# Patient Record
Sex: Female | Born: 1945 | Race: White | Hispanic: No | Smoking: Never smoker
Health system: Southern US, Community
[De-identification: ages and names within clinical notes are randomized; demographics above are authoritative.]

## PROBLEM LIST (undated history)

## (undated) DIAGNOSIS — I10 Essential (primary) hypertension: Secondary | ICD-10-CM

## (undated) DIAGNOSIS — I4891 Unspecified atrial fibrillation: Secondary | ICD-10-CM

## (undated) DIAGNOSIS — E039 Hypothyroidism, unspecified: Secondary | ICD-10-CM

## (undated) DIAGNOSIS — E669 Obesity, unspecified: Secondary | ICD-10-CM

## (undated) DIAGNOSIS — J302 Other seasonal allergic rhinitis: Secondary | ICD-10-CM

## (undated) DIAGNOSIS — R32 Unspecified urinary incontinence: Secondary | ICD-10-CM

## (undated) DIAGNOSIS — K8020 Calculus of gallbladder without cholecystitis without obstruction: Secondary | ICD-10-CM

## (undated) DIAGNOSIS — B029 Zoster without complications: Secondary | ICD-10-CM

## (undated) DIAGNOSIS — E079 Disorder of thyroid, unspecified: Secondary | ICD-10-CM

## (undated) DIAGNOSIS — D649 Anemia, unspecified: Secondary | ICD-10-CM

## (undated) DIAGNOSIS — Z9289 Personal history of other medical treatment: Secondary | ICD-10-CM

## (undated) DIAGNOSIS — N189 Chronic kidney disease, unspecified: Secondary | ICD-10-CM

## (undated) DIAGNOSIS — E785 Hyperlipidemia, unspecified: Secondary | ICD-10-CM

## (undated) DIAGNOSIS — K219 Gastro-esophageal reflux disease without esophagitis: Secondary | ICD-10-CM

## (undated) DIAGNOSIS — K5790 Diverticulosis of intestine, part unspecified, without perforation or abscess without bleeding: Secondary | ICD-10-CM

## (undated) HISTORY — PX: TUBAL LIGATION: SHX77

## (undated) HISTORY — DX: Zoster without complications: B02.9

## (undated) HISTORY — DX: Chronic kidney disease, unspecified: N18.9

## (undated) HISTORY — DX: Diverticulosis of intestine, part unspecified, without perforation or abscess without bleeding: K57.90

## (undated) HISTORY — DX: Personal history of other medical treatment: Z92.89

## (undated) HISTORY — DX: Unspecified urinary incontinence: R32

## (undated) HISTORY — DX: Obesity, unspecified: E66.9

## (undated) HISTORY — DX: Hyperlipidemia, unspecified: E78.5

## (undated) HISTORY — DX: Other seasonal allergic rhinitis: J30.2

## (undated) HISTORY — PX: COLONOSCOPY: SHX174

## (undated) HISTORY — DX: Disorder of thyroid, unspecified: E07.9

## (undated) HISTORY — DX: Essential (primary) hypertension: I10

## (undated) HISTORY — DX: Anemia, unspecified: D64.9

---

## 1997-04-24 ENCOUNTER — Ambulatory Visit (HOSPITAL_COMMUNITY): Admission: RE | Admit: 1997-04-24 | Discharge: 1997-04-24 | Payer: Self-pay | Admitting: Gynecology

## 1998-04-23 ENCOUNTER — Encounter: Payer: Self-pay | Admitting: Internal Medicine

## 1998-04-23 ENCOUNTER — Ambulatory Visit (HOSPITAL_COMMUNITY): Admission: RE | Admit: 1998-04-23 | Discharge: 1998-04-23 | Payer: Self-pay | Admitting: Internal Medicine

## 1998-05-31 ENCOUNTER — Encounter: Admission: RE | Admit: 1998-05-31 | Discharge: 1998-08-29 | Payer: Self-pay | Admitting: Internal Medicine

## 1998-09-06 ENCOUNTER — Encounter: Admission: RE | Admit: 1998-09-06 | Discharge: 1998-12-05 | Payer: Self-pay | Admitting: Internal Medicine

## 1998-12-06 ENCOUNTER — Encounter: Admission: RE | Admit: 1998-12-06 | Discharge: 1999-03-06 | Payer: Self-pay | Admitting: Internal Medicine

## 1999-05-16 ENCOUNTER — Encounter: Admission: RE | Admit: 1999-05-16 | Discharge: 1999-08-14 | Payer: Self-pay | Admitting: Internal Medicine

## 1999-05-22 ENCOUNTER — Other Ambulatory Visit: Admission: RE | Admit: 1999-05-22 | Discharge: 1999-05-22 | Payer: Self-pay | Admitting: Internal Medicine

## 1999-05-27 ENCOUNTER — Ambulatory Visit (HOSPITAL_COMMUNITY): Admission: RE | Admit: 1999-05-27 | Discharge: 1999-05-27 | Payer: Self-pay | Admitting: Internal Medicine

## 1999-05-27 ENCOUNTER — Encounter: Payer: Self-pay | Admitting: Internal Medicine

## 1999-08-15 ENCOUNTER — Encounter: Admission: RE | Admit: 1999-08-15 | Discharge: 1999-11-13 | Payer: Self-pay | Admitting: Internal Medicine

## 1999-12-16 ENCOUNTER — Encounter: Admission: RE | Admit: 1999-12-16 | Discharge: 2000-03-15 | Payer: Self-pay | Admitting: Internal Medicine

## 2000-04-08 ENCOUNTER — Encounter: Admission: RE | Admit: 2000-04-08 | Discharge: 2000-07-07 | Payer: Self-pay | Admitting: Internal Medicine

## 2000-06-07 ENCOUNTER — Other Ambulatory Visit: Admission: RE | Admit: 2000-06-07 | Discharge: 2000-06-07 | Payer: Self-pay | Admitting: Internal Medicine

## 2000-09-21 ENCOUNTER — Ambulatory Visit (HOSPITAL_COMMUNITY): Admission: RE | Admit: 2000-09-21 | Discharge: 2000-09-21 | Payer: Self-pay | Admitting: Internal Medicine

## 2000-09-21 ENCOUNTER — Encounter: Payer: Self-pay | Admitting: Internal Medicine

## 2001-06-16 ENCOUNTER — Other Ambulatory Visit: Admission: RE | Admit: 2001-06-16 | Discharge: 2001-06-16 | Payer: Self-pay | Admitting: Internal Medicine

## 2001-09-27 ENCOUNTER — Ambulatory Visit (HOSPITAL_COMMUNITY): Admission: RE | Admit: 2001-09-27 | Discharge: 2001-09-27 | Payer: Self-pay | Admitting: Unknown Physician Specialty

## 2001-09-27 ENCOUNTER — Encounter: Payer: Self-pay | Admitting: Internal Medicine

## 2002-03-02 DIAGNOSIS — K5790 Diverticulosis of intestine, part unspecified, without perforation or abscess without bleeding: Secondary | ICD-10-CM | POA: Insufficient documentation

## 2002-03-02 HISTORY — DX: Diverticulosis of intestine, part unspecified, without perforation or abscess without bleeding: K57.90

## 2002-06-22 ENCOUNTER — Other Ambulatory Visit: Admission: RE | Admit: 2002-06-22 | Discharge: 2002-06-22 | Payer: Self-pay | Admitting: Internal Medicine

## 2002-10-03 ENCOUNTER — Encounter: Payer: Self-pay | Admitting: Internal Medicine

## 2002-10-03 ENCOUNTER — Ambulatory Visit (HOSPITAL_COMMUNITY): Admission: RE | Admit: 2002-10-03 | Discharge: 2002-10-03 | Payer: Self-pay | Admitting: Internal Medicine

## 2002-11-02 ENCOUNTER — Ambulatory Visit (HOSPITAL_COMMUNITY): Admission: RE | Admit: 2002-11-02 | Discharge: 2002-11-02 | Payer: Self-pay | Admitting: Gastroenterology

## 2003-03-03 DIAGNOSIS — B029 Zoster without complications: Secondary | ICD-10-CM

## 2003-03-03 HISTORY — DX: Zoster without complications: B02.9

## 2003-10-09 ENCOUNTER — Ambulatory Visit (HOSPITAL_COMMUNITY): Admission: RE | Admit: 2003-10-09 | Discharge: 2003-10-09 | Payer: Self-pay | Admitting: Internal Medicine

## 2004-09-26 ENCOUNTER — Other Ambulatory Visit: Admission: RE | Admit: 2004-09-26 | Discharge: 2004-09-26 | Payer: Self-pay | Admitting: Internal Medicine

## 2004-12-09 ENCOUNTER — Ambulatory Visit (HOSPITAL_COMMUNITY): Admission: RE | Admit: 2004-12-09 | Discharge: 2004-12-09 | Payer: Self-pay | Admitting: Internal Medicine

## 2005-12-17 ENCOUNTER — Ambulatory Visit (HOSPITAL_COMMUNITY): Admission: RE | Admit: 2005-12-17 | Discharge: 2005-12-17 | Payer: Self-pay | Admitting: Internal Medicine

## 2006-10-20 ENCOUNTER — Other Ambulatory Visit: Admission: RE | Admit: 2006-10-20 | Discharge: 2006-10-20 | Payer: Self-pay | Admitting: Internal Medicine

## 2006-12-23 ENCOUNTER — Ambulatory Visit (HOSPITAL_COMMUNITY): Admission: RE | Admit: 2006-12-23 | Discharge: 2006-12-23 | Payer: Self-pay | Admitting: Internal Medicine

## 2007-12-29 ENCOUNTER — Ambulatory Visit (HOSPITAL_COMMUNITY): Admission: RE | Admit: 2007-12-29 | Discharge: 2007-12-29 | Payer: Self-pay | Admitting: Internal Medicine

## 2008-10-23 ENCOUNTER — Other Ambulatory Visit: Admission: RE | Admit: 2008-10-23 | Discharge: 2008-10-23 | Payer: Self-pay | Admitting: Internal Medicine

## 2008-12-31 ENCOUNTER — Ambulatory Visit (HOSPITAL_COMMUNITY): Admission: RE | Admit: 2008-12-31 | Discharge: 2008-12-31 | Payer: Self-pay | Admitting: Internal Medicine

## 2009-11-13 ENCOUNTER — Other Ambulatory Visit: Admission: RE | Admit: 2009-11-13 | Discharge: 2009-11-13 | Payer: Self-pay | Admitting: Internal Medicine

## 2010-01-02 ENCOUNTER — Ambulatory Visit (HOSPITAL_COMMUNITY): Admission: RE | Admit: 2010-01-02 | Discharge: 2010-01-02 | Payer: Self-pay | Admitting: Psychiatry

## 2010-07-18 NOTE — Op Note (Signed)
   NAME:  Kayla Hale, Kayla Hale                            ACCOUNT NO.:  [id number]   MEDICAL RECORD NO.:  [id number]                   PATIENT TYPE:  AMB   LOCATION:  ENDO                                 FACILITY:  [facility]   PHYSICIAN:  Danise Edge, M.D.                DATE OF BIRTH:  1945/08/10   DATE OF PROCEDURE:  11/02/2002  DATE OF DISCHARGE:                                 OPERATIVE REPORT   PROCEDURE PERFORMED:  Colonoscopy.   ENDOSCOPIST:  Charolett Bumpers, M.D.   INDICATIONS FOR PROCEDURE:  Ms. Kayla Hale is a 65 year old female born  12/14/1945.  Kayla Hale underwent her health maintenance  proctosigmoidoscopy and a polyp was discovered at 35 cm from the anal verge  per Dr. Jerelyn Scott Husain's report.   PREMEDICATION:  Versed 10 mg, Demerol 50 mg.   DESCRIPTION OF PROCEDURE:  After obtaining informed consent, Kayla Hale was  placed in the left lateral decubitus position.  I administered intravenous  Demerol and intravenous Versed to achieve conscious sedation for the  procedure.  The patient's blood pressure, oxygen saturations and cardiac  rhythm were monitored throughout the procedure and documented in the medical  record.   Anal inspection was normal.  Digital rectal exam was normal.  The pediatric  Olympus video colonoscope was introduced into the rectum and advanced to the  cecum.  Colonic preparation for the exam today was excellent.   Rectum:  Normal.   Sigmoid colon and descending colon:  Left colonic diverticulosis without  colorectal neoplasia.   Splenic flexure:  Normal.   Transverse colon:  Normal.   Hepatic flexure:  Normal.   Ascending colon:  Normal.   Cecum and ileocecal valve:  Normal.    ASSESSMENT:  Left colonic diverticulosis; otherwise, normal  proctocolonoscopy to the cecum.  No endoscopic evidence for the presence of  colorectal neoplasia.                                                 Danise Edge, M.D.    MJ/MEDQ  D:   11/02/2002  T:  11/03/2002  Job:  [id number]   cc:   Georgann Housekeeper, M.D.  [address]  [place]  [place] [address]  Fax: [phone]

## 2010-11-24 ENCOUNTER — Other Ambulatory Visit (HOSPITAL_COMMUNITY): Payer: Self-pay | Admitting: Internal Medicine

## 2010-11-24 DIAGNOSIS — Z1231 Encounter for screening mammogram for malignant neoplasm of breast: Secondary | ICD-10-CM

## 2011-01-06 ENCOUNTER — Ambulatory Visit (HOSPITAL_COMMUNITY): Payer: Self-pay

## 2011-02-03 ENCOUNTER — Ambulatory Visit (HOSPITAL_COMMUNITY)
Admission: RE | Admit: 2011-02-03 | Discharge: 2011-02-03 | Disposition: A | Discharge status: Home / Self Care | Payer: Managed Care, Other (non HMO) | Source: Ambulatory Visit | Attending: Internal Medicine | Admitting: Internal Medicine

## 2011-02-03 DIAGNOSIS — Z1231 Encounter for screening mammogram for malignant neoplasm of breast: Secondary | ICD-10-CM | POA: Insufficient documentation

## 2012-01-01 ENCOUNTER — Other Ambulatory Visit (HOSPITAL_COMMUNITY): Payer: Self-pay | Admitting: Internal Medicine

## 2012-01-01 DIAGNOSIS — Z1231 Encounter for screening mammogram for malignant neoplasm of breast: Secondary | ICD-10-CM

## 2012-02-05 ENCOUNTER — Ambulatory Visit (HOSPITAL_COMMUNITY)
Admission: RE | Admit: 2012-02-05 | Discharge: 2012-02-05 | Disposition: A | Discharge status: Home / Self Care | Payer: Medicare Other | Source: Ambulatory Visit | Attending: Internal Medicine | Admitting: Internal Medicine

## 2012-02-05 DIAGNOSIS — Z1231 Encounter for screening mammogram for malignant neoplasm of breast: Secondary | ICD-10-CM

## 2013-01-09 ENCOUNTER — Other Ambulatory Visit (HOSPITAL_COMMUNITY): Payer: Self-pay | Admitting: Internal Medicine

## 2013-01-09 DIAGNOSIS — Z1231 Encounter for screening mammogram for malignant neoplasm of breast: Secondary | ICD-10-CM

## 2013-02-13 ENCOUNTER — Ambulatory Visit (HOSPITAL_COMMUNITY)
Admission: RE | Admit: 2013-02-13 | Discharge: 2013-02-13 | Disposition: A | Discharge status: Home / Self Care | Payer: Medicare Other | Source: Ambulatory Visit | Attending: Internal Medicine | Admitting: Internal Medicine

## 2013-02-13 DIAGNOSIS — Z1231 Encounter for screening mammogram for malignant neoplasm of breast: Secondary | ICD-10-CM | POA: Insufficient documentation

## 2013-12-05 ENCOUNTER — Encounter: Payer: Self-pay | Admitting: Cardiovascular Disease

## 2013-12-05 ENCOUNTER — Telehealth: Payer: Self-pay | Admitting: Cardiovascular Disease

## 2013-12-05 ENCOUNTER — Ambulatory Visit (INDEPENDENT_AMBULATORY_CARE_PROVIDER_SITE_OTHER): Payer: Medicare Other | Admitting: Cardiovascular Disease

## 2013-12-05 VITALS — BP 122/80 | HR 65 | Ht 66.0 in | Wt 185.4 lb

## 2013-12-05 DIAGNOSIS — I482 Chronic atrial fibrillation, unspecified: Secondary | ICD-10-CM | POA: Insufficient documentation

## 2013-12-05 LAB — CBC WITH DIFFERENTIAL/PLATELET
Basophils Absolute: 0 10*3/uL (ref 0.0–0.1)
Basophils Relative: 0.5 % (ref 0.0–3.0)
Eosinophils Absolute: 0.2 10*3/uL (ref 0.0–0.7)
Eosinophils Relative: 3.8 % (ref 0.0–5.0)
HCT: 38.3 % (ref 36.0–46.0)
Hemoglobin: 12.8 g/dL (ref 12.0–15.0)
Lymphocytes Relative: 31.1 % (ref 12.0–46.0)
Lymphs Abs: 1.8 10*3/uL (ref 0.7–4.0)
MCHC: 33.5 g/dL (ref 30.0–36.0)
MCV: 94.6 fl (ref 78.0–100.0)
Monocytes Absolute: 0.4 10*3/uL (ref 0.1–1.0)
Monocytes Relative: 7.5 % (ref 3.0–12.0)
Neutro Abs: 3.4 10*3/uL (ref 1.4–7.7)
Neutrophils Relative %: 57.1 % (ref 43.0–77.0)
Platelets: 183 10*3/uL (ref 150.0–400.0)
RBC: 4.05 Mil/uL (ref 3.87–5.11)
RDW: 13.1 % (ref 11.5–15.5)
WBC: 5.9 10*3/uL (ref 4.0–10.5)

## 2013-12-05 NOTE — Assessment & Plan Note (Addendum)
Kayla Hale presents for evaluation of atrial fibrillation. She has chronic atrial fibrillation. Her EKG from 2001 shows atrial fibrillation with a well-controlled ventricular response.  She is completely asymptomatic. She does have a CHADS VASC score of 3 ( female, age 68, HTN)  Will start Eliquis 5 BID Will check labs including a TSH, basic metabolic profile, liver enzymes, CBC, Will  Order  an echocardiogram. I'll see her again in 3 months for a followup visit.

## 2013-12-05 NOTE — Patient Instructions (Addendum)
Check the price of the following anticoagulants:  Pradaxa 150 mg twice a day Xarelto 20 mg a day Eliquis 5 mg twice a day Savaysa 60 mg a day.  Your physician has requested that you have an echocardiogram. Echocardiography is a painless test that uses sound waves to create images of your heart. It provides your doctor with information about the size and shape of your heart and how well your heart's chambers and valves are working. This procedure takes approximately one hour. There are no restrictions for this procedure.  Your physician recommends that you have lab work:  TODAY - TSH, BMET, Liver, CBC  Your physician recommends that you schedule a follow-up appointment in: 3 months with Dr. Acie Fredrickson.

## 2013-12-05 NOTE — Progress Notes (Signed)
     Hyacinth Meeker Date of Birth  1945/09/02       [facility] Office [address], [place], [place]  [address]   [place], [place]  [address] [address]   Fax  [phone]     Fax [phone]  Problem List: 1. Atrial fibrillation - CHADS VASC score = 3 2. Hypertension  History of Present Illness:  Nirali is a 68 yo who is referred for atrial fib.  Hx of HTN.  She is basically a symptomatically she was found to have an irregular heart rate on  A visit to Urgent care ( which was for a bladder infection) . EKG revealed atrial fibrillation. She does have occasional episodes of palpitations but he seemed to be more related to stress.  Nonsmoker Rare ETOH -  Fhx - father died of MI. ( age 43s), sister has CAd,,  No current outpatient prescriptions on file prior to visit.   No current facility-administered medications on file prior to visit.    Allergies  Allergen Reactions  . Lisinopril     COUGH  . Norvasc [Amlodipine Besylate]     LEG EDEMA     Past Medical History  Diagnosis Date  . Hypertension   . Thyroid disease   . Hyperlipidemia   . Dyslipidemia   . Mild obesity   . Urinary incontinence   . Seasonal allergies   . Shingles 2005    LOWER BACK  . Diverticulosis 2004  . Mild anemia   . Chronic kidney disease     STAGE 2    Past Surgical History  Procedure Laterality Date  . Colonoscopy      History  Smoking status  . Never Smoker   Smokeless tobacco  . Not on file    History  Alcohol Use No    Family History  Problem Relation Age of Onset  . Heart attack Father   . Hypertension Father   . Heart attack Sister   . Diabetes Sister   . CVA Brother   . Diabetes Brother     Reviw of Systems:  Reviewed in the HPI.  All other systems are negative.  Physical Exam: Blood pressure 122/80, pulse 65, height 5\' 6"  (1.676 m), weight 185 lb 6.4 oz (84.097 kg). Wt  Readings from Last 3 Encounters:  12/05/13 185 lb 6.4 oz (84.097 kg)     General: Well developed, well nourished, in no acute distress.  Head: Normocephalic, atraumatic, sclera non-icteric, mucus membranes are moist,   Neck: Supple. Carotids are 2 + without bruits. No JVD   Lungs: Clear   Heart: Irreg. Irreg.  Normal s1s2  Abdomen: Soft, non-tender, non-distended with normal bowel sounds.  Msk:  Strength and tone are normal   Extremities: No clubbing or cyanosis. No edema.  Distal pedal pulses are 2+ and equal    Neuro: CN II - XII intact.  Alert and oriented X 3.   Psych:  Normal   ECG: Oct. 6, 2015: Atrial fibrillation with a ventricular rate of 65. She has no ST or T wave changes.  Assessment / Plan:

## 2013-12-05 NOTE — Telephone Encounter (Signed)
New message    Patient was seen today .   Need prior authorization eliquis 5 mg twice a day.   Fax  # [phone]- [facility].

## 2013-12-06 ENCOUNTER — Ambulatory Visit (HOSPITAL_COMMUNITY): Payer: Medicare Other | Attending: Internal Medicine | Admitting: Radiology

## 2013-12-06 DIAGNOSIS — Z8249 Family history of ischemic heart disease and other diseases of the circulatory system: Secondary | ICD-10-CM | POA: Insufficient documentation

## 2013-12-06 DIAGNOSIS — I482 Chronic atrial fibrillation, unspecified: Secondary | ICD-10-CM

## 2013-12-06 DIAGNOSIS — I10 Essential (primary) hypertension: Secondary | ICD-10-CM | POA: Diagnosis not present

## 2013-12-06 DIAGNOSIS — E785 Hyperlipidemia, unspecified: Secondary | ICD-10-CM | POA: Insufficient documentation

## 2013-12-06 LAB — BASIC METABOLIC PANEL
BUN: 23 mg/dL (ref 6–23)
CO2: 24 mEq/L (ref 19–32)
Calcium: 9.5 mg/dL (ref 8.4–10.5)
Chloride: 104 mEq/L (ref 96–112)
Creatinine, Ser: 0.9 mg/dL (ref 0.4–1.2)
GFR: 67.95 mL/min (ref >60.00)
Glucose, Bld: 97 mg/dL (ref 70–99)
Potassium: 4.5 mEq/L (ref 3.5–5.1)
Sodium: 139 mEq/L (ref 135–145)

## 2013-12-06 LAB — HEPATIC FUNCTION PANEL
ALT: 22 U/L (ref 0–35)
AST: 27 U/L (ref 0–37)
Albumin: 4.2 g/dL (ref 3.5–5.2)
Alkaline Phosphatase: 55 U/L (ref 39–117)
Bilirubin, Direct: 0.1 mg/dL (ref 0.0–0.3)
Total Bilirubin: 0.6 mg/dL (ref 0.2–1.2)
Total Protein: 7.6 g/dL (ref 6.0–8.3)

## 2013-12-06 LAB — TSH: TSH: 1.41 u[IU]/mL (ref 0.35–4.50)

## 2013-12-06 NOTE — Progress Notes (Signed)
Echocardiogram performed.  

## 2013-12-08 ENCOUNTER — Encounter: Payer: Self-pay | Admitting: Internal Medicine

## 2013-12-21 MED ORDER — APIXABAN 5 MG PO TABS: 5.0000 mg | ORAL_TABLET | Freq: Two times a day (BID) | ORAL | Status: DC

## 2013-12-21 NOTE — Telephone Encounter (Signed)
New message  ° ° °Patient calling for test results.   °

## 2013-12-21 NOTE — Telephone Encounter (Signed)
Reviewed Echo results with patient per Dr. Acie Fredrickson:   Normal LV function Mild valvular disease that if of no significant concern ( AI and MR)  Also reviewed lab results.    Patient requesting Rx for Eliquis 5 mg BID to [facility].  Patient was given samples at last ov and advised to call insurance company to price agents and to call back and let me know which medication she would like to take.   Sample placed at front desk for patient and PA initiated with [facility] Rx Approval received [phone]  Patient needs 1 month f/u CBC and BMET for NOAC therapy (11/6) - lab appointment made for 11/2 to accommodate patient's work schedule - patient will get lab work and Dr. Acie Fredrickson will follow

## 2014-01-01 ENCOUNTER — Other Ambulatory Visit (INDEPENDENT_AMBULATORY_CARE_PROVIDER_SITE_OTHER): Payer: Medicare Other | Admitting: *Deleted

## 2014-01-01 DIAGNOSIS — I482 Chronic atrial fibrillation, unspecified: Secondary | ICD-10-CM

## 2014-01-01 LAB — CBC WITH DIFFERENTIAL/PLATELET
Basophils Absolute: 0 10*3/uL (ref 0.0–0.1)
Basophils Relative: 0.5 % (ref 0.0–3.0)
Eosinophils Absolute: 0.3 10*3/uL (ref 0.0–0.7)
Eosinophils Relative: 6.3 % — ABNORMAL HIGH (ref 0.0–5.0)
HCT: 37.5 % (ref 36.0–46.0)
Hemoglobin: 12.4 g/dL (ref 12.0–15.0)
Lymphocytes Relative: 34.3 % (ref 12.0–46.0)
Lymphs Abs: 1.7 10*3/uL (ref 0.7–4.0)
MCHC: 33 g/dL (ref 30.0–36.0)
MCV: 94.6 fl (ref 78.0–100.0)
Monocytes Absolute: 0.4 10*3/uL (ref 0.1–1.0)
Monocytes Relative: 8.7 % (ref 3.0–12.0)
Neutro Abs: 2.5 10*3/uL (ref 1.4–7.7)
Neutrophils Relative %: 50.2 % (ref 43.0–77.0)
Platelets: 185 10*3/uL (ref 150.0–400.0)
RBC: 3.96 Mil/uL (ref 3.87–5.11)
RDW: 13.1 % (ref 11.5–15.5)
WBC: 4.9 10*3/uL (ref 4.0–10.5)

## 2014-01-01 LAB — BASIC METABOLIC PANEL
BUN: 17 mg/dL (ref 6–23)
CO2: 30 mEq/L (ref 19–32)
Calcium: 9 mg/dL (ref 8.4–10.5)
Chloride: 103 mEq/L (ref 96–112)
Creatinine, Ser: 0.8 mg/dL (ref 0.4–1.2)
GFR: 74.75 mL/min (ref >60.00)
Glucose, Bld: 97 mg/dL (ref 70–99)
Potassium: 4.5 mEq/L (ref 3.5–5.1)
Sodium: 137 mEq/L (ref 135–145)

## 2014-01-12 ENCOUNTER — Other Ambulatory Visit (HOSPITAL_COMMUNITY): Payer: Self-pay | Admitting: Internal Medicine

## 2014-01-12 DIAGNOSIS — Z1231 Encounter for screening mammogram for malignant neoplasm of breast: Secondary | ICD-10-CM

## 2014-03-08 ENCOUNTER — Ambulatory Visit: Payer: Medicare Other | Admitting: Cardiovascular Disease

## 2014-03-13 ENCOUNTER — Emergency Department (HOSPITAL_COMMUNITY): Payer: Medicare Other

## 2014-03-13 ENCOUNTER — Encounter (HOSPITAL_COMMUNITY): Payer: Self-pay | Admitting: *Deleted

## 2014-03-13 ENCOUNTER — Inpatient Hospital Stay (HOSPITAL_COMMUNITY)
Admission: EM | Admit: 2014-03-13 | Discharge: 2014-03-19 | DRG: 417 | Disposition: A | Discharge status: Home / Self Care | Payer: Medicare Other | Attending: Internal Medicine | Admitting: Internal Medicine

## 2014-03-13 DIAGNOSIS — N182 Chronic kidney disease, stage 2 (mild): Secondary | ICD-10-CM | POA: Diagnosis present

## 2014-03-13 DIAGNOSIS — I482 Chronic atrial fibrillation, unspecified: Secondary | ICD-10-CM | POA: Diagnosis present

## 2014-03-13 DIAGNOSIS — K851 Biliary acute pancreatitis without necrosis or infection: Secondary | ICD-10-CM

## 2014-03-13 DIAGNOSIS — K859 Acute pancreatitis without necrosis or infection, unspecified: Secondary | ICD-10-CM

## 2014-03-13 DIAGNOSIS — Z7901 Long term (current) use of anticoagulants: Secondary | ICD-10-CM | POA: Diagnosis not present

## 2014-03-13 DIAGNOSIS — K219 Gastro-esophageal reflux disease without esophagitis: Secondary | ICD-10-CM | POA: Diagnosis present

## 2014-03-13 DIAGNOSIS — R079 Chest pain, unspecified: Secondary | ICD-10-CM

## 2014-03-13 DIAGNOSIS — R7989 Other specified abnormal findings of blood chemistry: Secondary | ICD-10-CM | POA: Diagnosis not present

## 2014-03-13 DIAGNOSIS — K8051 Calculus of bile duct without cholangitis or cholecystitis with obstruction: Secondary | ICD-10-CM | POA: Diagnosis not present

## 2014-03-13 DIAGNOSIS — R109 Unspecified abdominal pain: Secondary | ICD-10-CM

## 2014-03-13 DIAGNOSIS — E785 Hyperlipidemia, unspecified: Secondary | ICD-10-CM | POA: Diagnosis present

## 2014-03-13 DIAGNOSIS — K831 Obstruction of bile duct: Secondary | ICD-10-CM | POA: Diagnosis present

## 2014-03-13 DIAGNOSIS — I129 Hypertensive chronic kidney disease with stage 1 through stage 4 chronic kidney disease, or unspecified chronic kidney disease: Secondary | ICD-10-CM | POA: Diagnosis present

## 2014-03-13 DIAGNOSIS — E039 Hypothyroidism, unspecified: Secondary | ICD-10-CM | POA: Diagnosis present

## 2014-03-13 DIAGNOSIS — R945 Abnormal results of liver function studies: Secondary | ICD-10-CM

## 2014-03-13 DIAGNOSIS — K8020 Calculus of gallbladder without cholecystitis without obstruction: Secondary | ICD-10-CM | POA: Diagnosis not present

## 2014-03-13 DIAGNOSIS — I10 Essential (primary) hypertension: Secondary | ICD-10-CM | POA: Diagnosis present

## 2014-03-13 HISTORY — DX: Calculus of gallbladder without cholecystitis without obstruction: K80.20

## 2014-03-13 HISTORY — DX: Gastro-esophageal reflux disease without esophagitis: K21.9

## 2014-03-13 HISTORY — DX: Unspecified atrial fibrillation: I48.91

## 2014-03-13 HISTORY — DX: Hypothyroidism, unspecified: E03.9

## 2014-03-13 LAB — APTT: aPTT: >200 seconds (ref 24–37)

## 2014-03-13 LAB — CBC WITH DIFFERENTIAL/PLATELET
Basophils Absolute: 0 10*3/uL (ref 0.0–0.1)
Basophils Relative: 0 % (ref 0–1)
Eosinophils Absolute: 0 10*3/uL (ref 0.0–0.7)
Eosinophils Relative: 0 % (ref 0–5)
HCT: 42.8 % (ref 36.0–46.0)
Hemoglobin: 15.2 g/dL — ABNORMAL HIGH (ref 12.0–15.0)
Lymphocytes Relative: 6 % — ABNORMAL LOW (ref 12–46)
Lymphs Abs: 0.6 10*3/uL — ABNORMAL LOW (ref 0.7–4.0)
MCH: 32.3 pg (ref 26.0–34.0)
MCHC: 35.5 g/dL (ref 30.0–36.0)
MCV: 91.1 fL (ref 78.0–100.0)
Monocytes Absolute: 0.4 10*3/uL (ref 0.1–1.0)
Monocytes Relative: 5 % (ref 3–12)
Neutro Abs: 8.2 10*3/uL — ABNORMAL HIGH (ref 1.7–7.7)
Neutrophils Relative %: 89 % — ABNORMAL HIGH (ref 43–77)
Platelets: 189 10*3/uL (ref 150–400)
RBC: 4.7 MIL/uL (ref 3.87–5.11)
RDW: 12.9 % (ref 11.5–15.5)
WBC: 9.3 10*3/uL (ref 4.0–10.5)

## 2014-03-13 LAB — COMPREHENSIVE METABOLIC PANEL
ALT: 241 U/L — ABNORMAL HIGH (ref 0–35)
AST: 211 U/L — ABNORMAL HIGH (ref 0–37)
Albumin: 4.3 g/dL (ref 3.5–5.2)
Alkaline Phosphatase: 181 U/L — ABNORMAL HIGH (ref 39–117)
Anion gap: 12 (ref 5–15)
BUN: 17 mg/dL (ref 6–23)
CO2: 24 mmol/L (ref 19–32)
Calcium: 9.7 mg/dL (ref 8.4–10.5)
Chloride: 104 mEq/L (ref 96–112)
Creatinine, Ser: 0.88 mg/dL (ref 0.50–1.10)
GFR calc Af Amer: 76 mL/min — ABNORMAL LOW (ref >90)
GFR calc non Af Amer: 66 mL/min — ABNORMAL LOW (ref >90)
Glucose, Bld: 145 mg/dL — ABNORMAL HIGH (ref 70–99)
Potassium: 3.9 mmol/L (ref 3.5–5.1)
Sodium: 140 mmol/L (ref 135–145)
Total Bilirubin: 4.3 mg/dL — ABNORMAL HIGH (ref 0.3–1.2)
Total Protein: 7.6 g/dL (ref 6.0–8.3)

## 2014-03-13 LAB — PROTIME-INR
INR: 1.26 (ref 0.00–1.49)
Prothrombin Time: 15.9 seconds — ABNORMAL HIGH (ref 11.6–15.2)

## 2014-03-13 LAB — HEPARIN LEVEL (UNFRACTIONATED): Heparin Unfractionated: 0.9 IU/mL — ABNORMAL HIGH (ref 0.30–0.70)

## 2014-03-13 LAB — AMYLASE: Amylase: 2514 U/L — ABNORMAL HIGH (ref 0–105)

## 2014-03-13 LAB — I-STAT TROPONIN, ED: Troponin i, poc: 0 ng/mL (ref 0.00–0.08)

## 2014-03-13 LAB — LIPASE, BLOOD: Lipase: >3000 U/L — ABNORMAL HIGH (ref 11–59)

## 2014-03-13 MED ORDER — SODIUM CHLORIDE 0.9 % IV BOLUS (SEPSIS)
1000.0000 mL | Freq: Once | INTRAVENOUS | Status: AC
Start: 2014-03-13 — End: 2014-03-13
  Administered 2014-03-13: 1000 mL via INTRAVENOUS

## 2014-03-13 MED ORDER — HEPARIN (PORCINE) IN NACL 100-0.45 UNIT/ML-% IJ SOLN
1000.0000 [IU]/h | INTRAMUSCULAR | Status: DC
  Administered 2014-03-13 – 2014-03-15 (×3): 1000 [IU]/h via INTRAVENOUS
  Filled 2014-03-13 (×6): qty 250

## 2014-03-13 MED ORDER — ONDANSETRON HCL 4 MG/2ML IJ SOLN
4.0000 mg | Freq: Once | INTRAMUSCULAR | Status: AC
  Administered 2014-03-13: 4 mg via INTRAVENOUS
  Filled 2014-03-13: qty 2

## 2014-03-13 MED ORDER — ONDANSETRON HCL 4 MG PO TABS
4.0000 mg | ORAL_TABLET | Freq: Four times a day (QID) | ORAL | Status: DC | PRN
Start: 2014-03-13 — End: 2014-03-19

## 2014-03-13 MED ORDER — SODIUM CHLORIDE 0.9 % IV BOLUS (SEPSIS)
1000.0000 mL | Freq: Once | INTRAVENOUS | Status: AC
  Administered 2014-03-13: 1000 mL via INTRAVENOUS

## 2014-03-13 MED ORDER — OXYBUTYNIN CHLORIDE 5 MG PO TABS
5.0000 mg | ORAL_TABLET | Freq: Two times a day (BID) | ORAL | Status: DC
  Administered 2014-03-13 – 2014-03-19 (×11): 5 mg via ORAL
  Filled 2014-03-13 (×14): qty 1

## 2014-03-13 MED ORDER — SODIUM CHLORIDE 0.9 % IV SOLN
INTRAVENOUS | Status: DC
  Administered 2014-03-13 – 2014-03-14 (×3): via INTRAVENOUS

## 2014-03-13 MED ORDER — MORPHINE SULFATE 2 MG/ML IJ SOLN
1.0000 mg | INTRAMUSCULAR | Status: DC | PRN
  Administered 2014-03-13 – 2014-03-15 (×5): 1 mg via INTRAVENOUS
  Filled 2014-03-13 (×5): qty 1

## 2014-03-13 MED ORDER — LEVOTHYROXINE SODIUM 75 MCG PO TABS
75.0000 ug | ORAL_TABLET | Freq: Every day | ORAL | Status: DC
  Administered 2014-03-14 – 2014-03-19 (×5): 75 ug via ORAL
  Filled 2014-03-13 (×7): qty 1
  Filled 2014-03-13: qty 3

## 2014-03-13 MED ORDER — MELATONIN 200 MCG PO TABS: 200.0000 ug | ORAL_TABLET | Freq: Every evening | ORAL | Status: DC | PRN

## 2014-03-13 MED ORDER — HYDROMORPHONE HCL 1 MG/ML IJ SOLN
1.0000 mg | Freq: Once | INTRAMUSCULAR | Status: AC
  Administered 2014-03-13: 1 mg via INTRAVENOUS
  Filled 2014-03-13: qty 1

## 2014-03-13 MED ORDER — ONDANSETRON HCL 4 MG/2ML IJ SOLN
4.0000 mg | Freq: Four times a day (QID) | INTRAMUSCULAR | Status: DC | PRN
  Administered 2014-03-13: 4 mg via INTRAVENOUS
  Filled 2014-03-13: qty 2

## 2014-03-13 MED ORDER — MORPHINE SULFATE 4 MG/ML IJ SOLN
4.0000 mg | Freq: Once | INTRAMUSCULAR | Status: AC
  Administered 2014-03-13: 4 mg via INTRAVENOUS
  Filled 2014-03-13: qty 1

## 2014-03-13 MED ORDER — HEPARIN BOLUS VIA INFUSION
4000.0000 [IU] | Freq: Once | INTRAVENOUS | Status: AC
  Administered 2014-03-13: 4000 [IU] via INTRAVENOUS
  Filled 2014-03-13: qty 4000

## 2014-03-13 NOTE — ED Notes (Signed)
Patient with complaints of vomitting since Sunday.  The vomitting has increased and she is having more abd pain.  Patient was seen at her MD and advised to come to ED for further eval.  Patient last bm was Sunday.  Patient states her normal pattern is BM daily.  Patient has noted belching.  Pale in color.  Patient with no complaints of urinary pain but states her urine is dark.  Patient was given something for nausea via injection.  Patient states her abdomen is sore in the mid to lower abdomen.  Patient has had frequent emesis today

## 2014-03-13 NOTE — ED Provider Notes (Signed)
CSN: [id number]     Arrival date & time 03/13/14  1238 History   First MD Initiated Contact with Patient 03/13/14 1327     Chief Complaint  Patient presents with  . Abdominal Pain  . Emesis   Kayla Hale is a 69 y.o. female with a history of chronic atrial fibrillation who presents to the emergency department complaining of nausea, vomiting and abdominal pain since Sunday. The patient reports she ate a [employer] hamburger on Sunday and started having lots of vomiting and nausea. She reports feeling better yesterday however her abdominal pain and nausea returned today. The patient claims epigastric and right upper quadrant abdominal pain that she rates at an 8 out of 10. Patient reports vomiting possibly 30 times today. Patient denies diarrhea. Patient's last bowel movement was on Sunday and was normal. Patient reports chills without fever. The patient was seen by her primary care provider and given 50 mg of Phenergan at his office, with mild relief of her nausea. Patient denies previous abdominal surgeries. The patient denies fevers, hematemesis, dysuria, hematuria, hematochezia, sick contacts, diarrhea, chest pain or shortness of breath.  (Consider location/radiation/quality/duration/timing/severity/associated sxs/prior Treatment) HPI  Past Medical History  Diagnosis Date  . Hypertension   . Thyroid disease   . Hyperlipidemia   . Dyslipidemia   . Mild obesity   . Urinary incontinence   . Seasonal allergies   . Shingles 2005    LOWER BACK  . Diverticulosis 2004  . Mild anemia   . Chronic kidney disease     STAGE 2   Past Surgical History  Procedure Laterality Date  . Colonoscopy     Family History  Problem Relation Age of Onset  . Heart attack Father   . Hypertension Father   . Heart attack Sister   . Diabetes Sister   . CVA Brother   . Diabetes Brother    History  Substance Use Topics  . Smoking status: Never Smoker   . Smokeless tobacco: Not on file  . Alcohol Use: No    OB History    No data available     Review of Systems  Constitutional: Positive for chills. Negative for fever.  HENT: Negative for congestion, sore throat and trouble swallowing.   Eyes: Negative for visual disturbance.  Respiratory: Negative for cough, shortness of breath and wheezing.   Cardiovascular: Negative for chest pain and palpitations.  Gastrointestinal: Positive for nausea, vomiting and abdominal pain. Negative for diarrhea and blood in stool.  Genitourinary: Negative for dysuria, urgency, frequency, hematuria and flank pain.  Musculoskeletal: Negative for back pain and neck pain.  Skin: Negative for rash.  Neurological: Negative for syncope, weakness, light-headedness and headaches.  All other systems reviewed and are negative.     Allergies  Lisinopril and Norvasc  Home Medications   Prior to Admission medications   Medication Sig Start Date End Date Taking? Authorizing Provider  ALPRAZolam Duanne Moron) 0.5 MG tablet Take 0.5 mg by mouth See admin instructions. Takes 1 tablet once a week as needed   Yes Historical Provider, MD  apixaban (ELIQUIS) 5 MG TABS tablet Take 1 tablet (5 mg total) by mouth 2 (two) times daily. 12/21/13  Yes Thayer Headings, MD  bisoprolol-hydrochlorothiazide [facility]) 10-6.25 MG per tablet Take 1 tablet by mouth daily.   Yes Historical Provider, MD  Calcium Carbonate-Vitamin D (CALCIUM-VITAMIN D) 500-200 MG-UNIT per tablet Take 1 tablet by mouth daily.   Yes Historical Provider, MD  cholecalciferol (VITAMIN D) 1000 UNITS  tablet Take 1,000 Units by mouth daily.   Yes Historical Provider, MD  levothyroxine (SYNTHROID, LEVOTHROID) 75 MCG tablet Take 75 mcg by mouth daily before breakfast.   Yes Historical Provider, MD  Melatonin 200 MCG TABS Take 200 mcg by mouth at bedtime as needed.   Yes Historical Provider, MD  Multiple Vitamin (MULTIVITAMIN) tablet Take 1 tablet by mouth daily.   Yes Historical Provider, MD  omeprazole (PRILOSEC) 20 MG capsule  Take 20 mg by mouth daily.   Yes Historical Provider, MD  oxybutynin (DITROPAN) 5 MG tablet Take 5 mg by mouth See admin instructions. States she takes 2 to 3 tablets daily   Yes Historical Provider, MD  pravastatin (PRAVACHOL) 20 MG tablet Take 20 mg by mouth daily.   Yes Historical Provider, MD   BP 103/80 mmHg  Pulse 118  Temp(Src) 97.7 F (36.5 C) (Oral)  Resp 18  Ht 5\' 6"  (1.676 m)  Wt 181 lb (82.101 kg)  BMI 29.23 kg/m2  SpO2 95% Physical Exam  Constitutional: She appears well-developed and well-nourished. No distress.  HENT:  Head: Normocephalic and atraumatic.  Mouth/Throat: Oropharynx is clear and moist. No oropharyngeal exudate.  Eyes: Conjunctivae are normal. Pupils are equal, round, and reactive to light. Right eye exhibits no discharge. Left eye exhibits no discharge.  Neck: Normal range of motion. Neck supple.  Cardiovascular: Normal rate, normal heart sounds and intact distal pulses.  Exam reveals no gallop and no friction rub.   No murmur heard. Irregularly irregular rhythm. Bilateral radial pulses are intact.  Pulmonary/Chest: Effort normal and breath sounds normal. No respiratory distress. She has no wheezes. She has no rales.  Abdominal: Soft. Bowel sounds are normal. She exhibits no distension and no mass. There is tenderness. There is no rebound and no guarding.  Abdomen is soft. Patient has epigastric and right upper quadrant tenderness to palpation. Bowel sounds are present. Negative psoas and obturator sign.  Musculoskeletal: She exhibits no edema.  Lymphadenopathy:    She has no cervical adenopathy.  Neurological: She is alert. Coordination normal.  Skin: Skin is warm and dry. No rash noted. She is not diaphoretic. No erythema. No pallor.  Psychiatric: She has a normal mood and affect. Her behavior is normal.  Nursing note and vitals reviewed.   ED Course  Procedures (including critical care time) Labs Review Labs Reviewed  COMPREHENSIVE METABOLIC PANEL  - Abnormal; Notable for the following:    Glucose, Bld 145 (*)    AST 211 (*)    ALT 241 (*)    Alkaline Phosphatase 181 (*)    Total Bilirubin 4.3 (*)    GFR calc non Af Amer 66 (*)    GFR calc Af Amer 76 (*)    All other components within normal limits  CBC WITH DIFFERENTIAL - Abnormal; Notable for the following:    Hemoglobin 15.2 (*)    Neutrophils Relative % 89 (*)    Neutro Abs 8.2 (*)    Lymphocytes Relative 6 (*)    Lymphs Abs 0.6 (*)    All other components within normal limits  LIPASE, BLOOD - Abnormal; Notable for the following:    Lipase >3000 (*)    All other components within normal limits  AMYLASE - Abnormal; Notable for the following:    Amylase 2514 (*)    All other components within normal limits  I-STAT TROPOININ, ED    Imaging Review Dg Chest 2 View  03/13/2014   CLINICAL DATA:  Chest pain  and abdominal pain.  Vomiting.  EXAM: CHEST  2 VIEW  COMPARISON:  None.  FINDINGS: Heart size and pulmonary vascularity are normal and the lungs are clear. No acute osseous abnormality. No effusions.  IMPRESSION: No active cardiopulmonary disease.   Electronically Signed   By: Rozetta Nunnery M.D.   On: 03/13/2014 14:44   US Abdomen Complete  03/13/2014   CLINICAL DATA:  Abdominal pain and vomiting.  EXAM: ULTRASOUND ABDOMEN COMPLETE  COMPARISON:  None.  FINDINGS: Gallbladder: A single stone measuring 2.3 cm is seen in the fundus of the gallbladder and there is a small amount of gallbladder sludge. No wall thickening or pericholecystic fluid is identified. Sonographer reports negative Murphy's sign.  Common bile duct: Diameter: 0.4 cm  Liver: No focal lesion identified. Within normal limits in parenchymal echogenicity.  IVC: Not visualized.  Pancreas: Not visualized.  Spleen: Size and appearance within normal limits.  Right Kidney: Length: 9.3 cm. Echogenicity within normal limits. No mass or hydronephrosis visualized.  Left Kidney: Length: 9.5 cm. Echogenicity within normal limits.  No mass or hydronephrosis visualized.  Abdominal aorta: No aneurysm visualized.  Other findings: None.  IMPRESSION: Single 2.3 cm stone in the fundus of the gallbladder and a small amount of gallbladder sludge without evidence of cholecystitis.  The inferior vena cava and pancreas are not visualized.   Electronically Signed   By: Inge Rise M.D.   On: 03/13/2014 15:35     EKG Interpretation   Date/Time:  Tuesday March 13 2014 13:16:06 EST Ventricular Rate:  93 PR Interval:    QRS Duration: 76 QT Interval:  368 QTC Calculation: 457 R Axis:   85 Text Interpretation:  Atrial fibrillation Abnormal ECG Confirmed by COOK   MD, BRIAN ([id number]) on 03/13/2014 1:38:26 PM      Filed Vitals:   03/13/14 1545 03/13/14 1600 03/13/14 1615 03/13/14 1645  BP: 140/91 145/99 129/99 103/80  Pulse: 52 77  118  Temp:      TempSrc:      Resp: 23   18  Height:      Weight:      SpO2: 99% 96% 96% 95%     MDM   Final diagnoses:  Gallstone pancreatitis   This is a 69 year old female with a history of chronic kidney disease, chronic atrial fibrillation on Gerald Stabs who presents to emergency room complaining of nausea and vomiting for the past 3 days as well as epigastric and right upper quadrant abdominal pain. Patient is afebrile and nontoxic appearing. Abdominal ultrasound indicates a single 2.3 cm stone in the fundus of the gallbladder without evidence of cholecystitis. Common bile duct diameter is 0.4 cm. Patient's liver enzymes are mildly elevated with an AST of 211 and ALT of 241. The patient's creatinine is 0.88 with a GFR of 76. Patient's lipase is greater than 3000. Patient has evidence of gallstone pancreatitis. Spoke with Dr. Herbie Baltimore Buccini who recommends pushing IV fluids and a consult to Gen. surgery. He will see the patient in consult at some point today.  I spoke with general surgeon Dr. Donne Hazel who says general surgery will consult after her pancreatitis has resolved and general surgery  will see her sometime tomorrow, but there is no need for them to see her tonight.  Consult placed to [facility] for admission.  Spoke with Dr. Erlinda Hong from [facility] who accepts for admission. Patient is in agreement with admission.   This patient was discussed with and evaluated by Dr. Lacinda Axon who agrees with  assessment and plan.     Hanley Hays, PA-C 03/13/14 [place], MD 03/13/14 1743

## 2014-03-13 NOTE — Progress Notes (Signed)
PHARMACIST - PHYSICIAN ORDER COMMUNICATION  CONCERNING: P&T Medication Policy on Herbal Medications  DESCRIPTION:  This patient's order for:  melatonin  has been noted.  This product(s) is classified as an "herbal" or natural product. Due to a lack of definitive safety studies or FDA approval, nonstandard manufacturing practices, plus the potential risk of unknown drug-drug interactions while on inpatient medications, the Pharmacy and Therapeutics Committee does not permit the use of "herbal" or natural products of this type within [place].   ACTION TAKEN: The pharmacy department is unable to verify this order at this time. Please reevaluate patient's clinical condition at discharge and address if the herbal or natural product(s) should be resumed at that time.   

## 2014-03-13 NOTE — Progress Notes (Signed)
ANTICOAGULATION CONSULT NOTE - Initial Consult  Pharmacy Consult for Heparin Indication: atrial fibrillation  Allergies  Allergen Reactions  . Lisinopril     COUGH  . Norvasc [Amlodipine Besylate]     LEG EDEMA     Patient Measurements: Height: 5' 6.38" (168.6 cm) Weight: 184 lb 12.8 oz (83.825 kg) IBW/kg (Calculated) : 60.17 Heparin Dosing Weight:  78 kg   Vital Signs: Temp: 98.9 F (37.2 C) (01/[birth year] 1945) Temp Source: Oral (01/[birth year] 1945) BP: 129/70 mmHg (01/12 1945) Pulse Rate: 82 (01/12 1945)  Labs:  Recent Labs  03/13/14 1315  HGB 15.2*  HCT 42.8  PLT 189  CREATININE 0.88    Estimated Creatinine Clearance: 67.2 mL/min (by C-G formula based on Cr of 0.88).   Medical History: Past Medical History  Diagnosis Date  . Hypertension   . Thyroid disease   . Hyperlipidemia   . Dyslipidemia   . Mild obesity   . Urinary incontinence   . Seasonal allergies   . Shingles 2005    LOWER BACK  . Diverticulosis 2004  . Mild anemia   . Chronic kidney disease     STAGE 2    Medications:  Prescriptions prior to admission  Medication Sig Dispense Refill Last Dose  . ALPRAZolam (XANAX) 0.5 MG tablet Take 0.5 mg by mouth See admin instructions. Takes 1 tablet once a week as needed   03/12/2014 at Unknown time  . apixaban (ELIQUIS) 5 MG TABS tablet Take 1 tablet (5 mg total) by mouth 2 (two) times daily. 180 tablet 3 03/12/2014 at Unknown time  . bisoprolol-hydrochlorothiazide (ZIAC) 10-6.25 MG per tablet Take 1 tablet by mouth daily.   03/12/2014 at 800  . Calcium Carbonate-Vitamin D (CALCIUM-VITAMIN D) 500-200 MG-UNIT per tablet Take 1 tablet by mouth daily.   03/12/2014 at Unknown time  . cholecalciferol (VITAMIN D) 1000 UNITS tablet Take 1,000 Units by mouth daily.   03/12/2014 at Unknown time  . levothyroxine (SYNTHROID, LEVOTHROID) 75 MCG tablet Take 75 mcg by mouth daily before breakfast.   03/12/2014 at Unknown time  . Melatonin 200 MCG TABS Take 200 mcg by mouth at  bedtime as needed.   03/12/2014 at Unknown time  . Multiple Vitamin (MULTIVITAMIN) tablet Take 1 tablet by mouth daily.   03/12/2014 at Unknown time  . omeprazole (PRILOSEC) 20 MG capsule Take 20 mg by mouth daily.   03/12/2014 at Unknown time  . oxybutynin (DITROPAN) 5 MG tablet Take 5 mg by mouth See admin instructions. States she takes 2 to 3 tablets daily   03/12/2014 at Unknown time  . pravastatin (PRAVACHOL) 20 MG tablet Take 20 mg by mouth daily.   03/12/2014 at Unknown time    Assessment: 69 y/o F with afib on Eliquis presents with intractable N/V (no diarrhea) x 2 days>>Gallstone pancreatitis. Amylast 2514, Lipase>3000, AST 2111, ALT 241, Tbili 4.3.   Anticoag: Afib on Eliquis PTA. Last dose recorded 1/11. Baseline Hgb 15.2. Plts 189.  Goal of Therapy:  aPTT 66-102 seconds Monitor platelets by anticoagulation protocol: Yes   Plan:  Heparin 4000 unit IV bolus Heparin 1000 units/hr APTT and Heparin level in 6-8 hrs after heparin starts Daily HL, aPTT, and CBC.   Chalsey Leeth S. Alford Highland, PharmD, BCPS Clinical Staff Pharmacist Pager [phone]  Eilene Ghazi Stillinger 03/13/2014,8:36 PM

## 2014-03-13 NOTE — H&P (Signed)
History and Physical  Kayla Hale WLN:[id number] DOB: 1946-01-19 DOA: 03/13/2014  Referring physician: ED PCP: Wenda Low, MD   Chief Complaint: ab pain  HPI: Kayla Hale is a 69 y.o. female with h/o chronic atrial fibrillation who presents to the emergency department complaining of nausea, vomiting and abdominal pain since Sunday. The patient reports she ate a [employer] hamburger on Sunday and started having lots of vomiting and nausea. She reports feeling better yesterday however her abdominal pain and nausea returned today. The patient claims epigastric and right upper quadrant abdominal pain that she rates at an 8 out of 10. Patient reports vomiting possibly 30 times today. Patient denies diarrhea. Patient's last bowel movement was on Sunday and was normal. Patient reports chills without fever. The patient was seen by her primary care provider and given 50 mg of Phenergan at his office, with mild relief of her nausea. Patient denies previous abdominal surgeries. The patient denies fevers, hematemesis, dysuria, hematuria, hematochezia, sick contacts, diarrhea, chest pain or shortness of breath. Initial lab works in ED revealed elevated lft and lipase. Ab [place] Single 2.3 cm stone in the fundus of the gallbladder and a small amount of gallbladder sludge without evidence of cholecystitis. ED consulted General surgery and GI. hospitalist service contacted for admission.    Review of Systems:   Detail per HPI,  Review of systems are otherwise negative  Past Medical History  Diagnosis Date  . Hypertension   . Thyroid disease   . Hyperlipidemia   . Dyslipidemia   . Mild obesity   . Urinary incontinence   . Seasonal allergies   . Shingles 2005    LOWER BACK  . Diverticulosis 2004  . Mild anemia   . Chronic kidney disease     STAGE 2   Past Surgical History  Procedure Laterality Date  . Colonoscopy     Social History:  reports that she has never smoked. She does not have any  smokeless tobacco history on file. She reports that she does not drink alcohol or use illicit drugs. Patient lives at home with family & is able to participate in activities of daily living.  Allergies  Allergen Reactions  . Lisinopril     COUGH  . Norvasc [Amlodipine Besylate]     LEG EDEMA     Family History  Problem Relation Age of Onset  . Heart attack Father   . Hypertension Father   . Heart attack Sister   . Diabetes Sister   . CVA Brother   . Diabetes Brother       Prior to Admission medications   Medication Sig Start Date End Date Taking? Authorizing Provider  ALPRAZolam Duanne Moron) 0.5 MG tablet Take 0.5 mg by mouth See admin instructions. Takes 1 tablet once a week as needed   Yes Historical Provider, MD  apixaban (ELIQUIS) 5 MG TABS tablet Take 1 tablet (5 mg total) by mouth 2 (two) times daily. 12/21/13  Yes Thayer Headings, MD  bisoprolol-hydrochlorothiazide [facility]) 10-6.25 MG per tablet Take 1 tablet by mouth daily.   Yes Historical Provider, MD  Calcium Carbonate-Vitamin D (CALCIUM-VITAMIN D) 500-200 MG-UNIT per tablet Take 1 tablet by mouth daily.   Yes Historical Provider, MD  cholecalciferol (VITAMIN D) 1000 UNITS tablet Take 1,000 Units by mouth daily.   Yes Historical Provider, MD  levothyroxine (SYNTHROID, LEVOTHROID) 75 MCG tablet Take 75 mcg by mouth daily before breakfast.   Yes Historical Provider, MD  Melatonin 200 MCG TABS Take 200  mcg by mouth at bedtime as needed.   Yes Historical Provider, MD  Multiple Vitamin (MULTIVITAMIN) tablet Take 1 tablet by mouth daily.   Yes Historical Provider, MD  omeprazole (PRILOSEC) 20 MG capsule Take 20 mg by mouth daily.   Yes Historical Provider, MD  oxybutynin (DITROPAN) 5 MG tablet Take 5 mg by mouth See admin instructions. States she takes 2 to 3 tablets daily   Yes Historical Provider, MD  pravastatin (PRAVACHOL) 20 MG tablet Take 20 mg by mouth daily.   Yes Historical Provider, MD    Physical Exam: BP 103/80 mmHg   Pulse 118  Temp(Src) 97.7 F (36.5 C) (Oral)  Resp 18  Ht 5\' 6"  (1.676 m)  Wt 82.101 kg (181 lb)  BMI 29.23 kg/m2  SpO2 95%  General:  NAD, pleasant Eyes: PERRL ENT: dry oral mucosa Neck: supple Cardiovascular: irregualr Respiratory: CTABL Abdomen: mild tender epigastric region, no guarding, no rebound, positive bowel sounds Skin: no rash Musculoskeletal: no edema Psychiatric: pleasant Neurologic: aaox3          Labs on Admission:  Basic Metabolic Panel:  Recent Labs Lab 03/13/14 1315  NA 140  K 3.9  CL 104  CO2 24  GLUCOSE 145*  BUN 17  CREATININE 0.88  CALCIUM 9.7   Liver Function Tests:  Recent Labs Lab 03/13/14 1315  AST 211*  ALT 241*  ALKPHOS 181*  BILITOT 4.3*  PROT 7.6  ALBUMIN 4.3    Recent Labs Lab 03/13/14 1315  LIPASE >3000*  AMYLASE 2514*   No results for input(s): AMMONIA in the last 168 hours. CBC:  Recent Labs Lab 03/13/14 1315  WBC 9.3  NEUTROABS 8.2*  HGB 15.2*  HCT 42.8  MCV 91.1  PLT 189   Cardiac Enzymes: No results for input(s): CKTOTAL, CKMB, CKMBINDEX, TROPONINI in the last 168 hours.  BNP (last 3 results) No results for input(s): PROBNP in the last 8760 hours. CBG: No results for input(s): GLUCAP in the last 168 hours.  Radiological Exams on Admission: Dg Chest 2 View  03/13/2014   CLINICAL DATA:  Chest pain and abdominal pain.  Vomiting.  EXAM: CHEST  2 VIEW  COMPARISON:  None.  FINDINGS: Heart size and pulmonary vascularity are normal and the lungs are clear. No acute osseous abnormality. No effusions.  IMPRESSION: No active cardiopulmonary disease.   Electronically Signed   By: Rozetta Nunnery M.D.   On: 03/13/2014 14:44   US Abdomen Complete  03/13/2014   CLINICAL DATA:  Abdominal pain and vomiting.  EXAM: ULTRASOUND ABDOMEN COMPLETE  COMPARISON:  None.  FINDINGS: Gallbladder: A single stone measuring 2.3 cm is seen in the fundus of the gallbladder and there is a small amount of gallbladder sludge. No wall  thickening or pericholecystic fluid is identified. Sonographer reports negative Murphy's sign.  Common bile duct: Diameter: 0.4 cm  Liver: No focal lesion identified. Within normal limits in parenchymal echogenicity.  IVC: Not visualized.  Pancreas: Not visualized.  Spleen: Size and appearance within normal limits.  Right Kidney: Length: 9.3 cm. Echogenicity within normal limits. No mass or hydronephrosis visualized.  Left Kidney: Length: 9.5 cm. Echogenicity within normal limits. No mass or hydronephrosis visualized.  Abdominal aorta: No aneurysm visualized.  Other findings: None.  IMPRESSION: Single 2.3 cm stone in the fundus of the gallbladder and a small amount of gallbladder sludge without evidence of cholecystitis.  The inferior vena cava and pancreas are not visualized.   Electronically Signed   By: Marcello Moores  Dalessio M.D.   On: 03/13/2014 15:35    EKG: Independently reviewed. afib  Assessment/Plan Present on Admission:  . Gallstone  Gallstone pancreatitis: npo except meds. IVF, analgesic/antiemetics. Following cmp/lipase in am. GI/general surgery consulted. Possible surgery once lipase normalized.  Chronic afib: continue betablocker at lower dose for rate control, hold apixaban, change to heparin drip for possible surgery once lipase return to normal.  H/o htn, continue betablocker, hold hctz.  H/o hld, hold statin in the setting of lft elevation. H/o hypothyroidism: continue synthroid. H/o GERD: iv protonix   Consultants: GI/gen surg  Code Status: full  Family Communication: patient, daughter and son in law  Disposition Plan: inpatient admission  Time spent: 11mins  Avalie Oconnor,FangMD/PhD [facility] Pager [phone]  If 7PM-7AM, please contact night-coverage at [email or web address], password [facility]

## 2014-03-13 NOTE — ED Notes (Signed)
Attempted to give report to floor 

## 2014-03-13 NOTE — Consult Note (Signed)
Referring Provider:   Dr. Florencia Reasons ([facility]) Primary Care Physician:  Wenda Low, MD Primary Gastroenterologist:  Dr. Wynetta Emery  Reason for Consultation:  Gallstone pancreatitis  HPI: Kayla Hale is a 69 y.o. female with a generally negative past GI history, with history of atrial fibrillation on Eloquis for chronic anticoagulation, who presented to the emergency room today with what appears to be gallstone pancreatitis.   She was awakened out of sleep in the middle of the night last night with severe periumbilical and lower abdominal pain, as well as protracted nausea and vomiting. She also had some chills this morning, and dark urine. No problem with melena or hematochezia.  She was seen in her primary physician's office and referred to the emergency room, where her lipase was greater than 3000, amylase was 2500, bilirubin was 4.3, and transaminases were in the 200 range.   An abdominal ultrasound shows cholelithiasis but a normal caliber duct.   Since coming into the emergency room, she has had considerable relief of pain with the help of medication.   Her white count and calcium are normal, and her hemoglobin, as expected, is somewhat elevated at 15.2. She has been receiving aggressive IV fluids.  From the colon cancer screening perspective, she has had colonoscopy by Dr. Howell Rucks and is apparently up-to-date on that exam.   Past Medical History  Diagnosis Date  . Hypertension   . Thyroid disease   . Hyperlipidemia   . Dyslipidemia   . Mild obesity   . Urinary incontinence   . Seasonal allergies   . Shingles 2005    LOWER BACK  . Diverticulosis 2004  . Mild anemia   . Chronic kidney disease     STAGE 2    Past Surgical History  Procedure Laterality Date  . Colonoscopy      Prior to Admission medications   Medication Sig Start Date End Date Taking? Authorizing Provider  ALPRAZolam Duanne Moron) 0.5 MG tablet Take 0.5 mg by mouth See admin instructions. Takes  1 tablet once a week as needed   Yes Historical Provider, MD  apixaban (ELIQUIS) 5 MG TABS tablet Take 1 tablet (5 mg total) by mouth 2 (two) times daily. 12/21/13  Yes Thayer Headings, MD  bisoprolol-hydrochlorothiazide [facility]) 10-6.25 MG per tablet Take 1 tablet by mouth daily.   Yes Historical Provider, MD  Calcium Carbonate-Vitamin D (CALCIUM-VITAMIN D) 500-200 MG-UNIT per tablet Take 1 tablet by mouth daily.   Yes Historical Provider, MD  cholecalciferol (VITAMIN D) 1000 UNITS tablet Take 1,000 Units by mouth daily.   Yes Historical Provider, MD  levothyroxine (SYNTHROID, LEVOTHROID) 75 MCG tablet Take 75 mcg by mouth daily before breakfast.   Yes Historical Provider, MD  Melatonin 200 MCG TABS Take 200 mcg by mouth at bedtime as needed.   Yes Historical Provider, MD  Multiple Vitamin (MULTIVITAMIN) tablet Take 1 tablet by mouth daily.   Yes Historical Provider, MD  omeprazole (PRILOSEC) 20 MG capsule Take 20 mg by mouth daily.   Yes Historical Provider, MD  oxybutynin (DITROPAN) 5 MG tablet Take 5 mg by mouth See admin instructions. States she takes 2 to 3 tablets daily   Yes Historical Provider, MD  pravastatin (PRAVACHOL) 20 MG tablet Take 20 mg by mouth daily.   Yes Historical Provider, MD    No current facility-administered medications for this encounter.   Current Outpatient Prescriptions  Medication Sig Dispense Refill  . ALPRAZolam (XANAX) 0.5 MG tablet Take 0.5 mg by mouth  See admin instructions. Takes 1 tablet once a week as needed    . apixaban (ELIQUIS) 5 MG TABS tablet Take 1 tablet (5 mg total) by mouth 2 (two) times daily. 180 tablet 3  . bisoprolol-hydrochlorothiazide (ZIAC) 10-6.25 MG per tablet Take 1 tablet by mouth daily.    . Calcium Carbonate-Vitamin D (CALCIUM-VITAMIN D) 500-200 MG-UNIT per tablet Take 1 tablet by mouth daily.    . cholecalciferol (VITAMIN D) 1000 UNITS tablet Take 1,000 Units by mouth daily.    Marland Kitchen levothyroxine (SYNTHROID, LEVOTHROID) 75 MCG tablet  Take 75 mcg by mouth daily before breakfast.    . Melatonin 200 MCG TABS Take 200 mcg by mouth at bedtime as needed.    . Multiple Vitamin (MULTIVITAMIN) tablet Take 1 tablet by mouth daily.    Marland Kitchen omeprazole (PRILOSEC) 20 MG capsule Take 20 mg by mouth daily.    Marland Kitchen oxybutynin (DITROPAN) 5 MG tablet Take 5 mg by mouth See admin instructions. States she takes 2 to 3 tablets daily    . pravastatin (PRAVACHOL) 20 MG tablet Take 20 mg by mouth daily.      Allergies as of 03/13/2014 - Review Complete 03/13/2014  Allergen Reaction Noted  . Lisinopril  12/05/2013  . Norvasc [amlodipine besylate]  12/05/2013    Family History  Problem Relation Age of Onset  . Heart attack Father   . Hypertension Father   . Heart attack Sister   . Diabetes Sister   . CVA Brother   . Diabetes Brother     History   Social History  . Marital Status: Married    Spouse Name: N/A    Number of Children: N/A  . Years of Education: N/A   Occupational History  .  works as a Theme park manager, part-time, at [employer] (previously [employer])    Social History Main Topics  . Smoking status: Never Smoker   . Smokeless tobacco: Not on file  . Alcohol Use: No  . Drug Use: No  . Sexual Activity: Not on file   Other Topics Concern  . Not on file   Social History Narrative    Review of Systems: No prodromal GI tract symptoms. See history of present illness.  Physical Exam: Vital signs in last 24 hours: Temp:  [97.7 F (36.5 C)-98.3 F (36.8 C)] 98.3 F (36.8 C) (01/12 1859) Pulse Rate:  [44-108] 93 (01/12 1900) Resp:  [12-27] 27 (01/12 1900) BP: (103-145)/(62-99) 132/65 mmHg (01/12 1900) SpO2:  [90 %-100 %] 90 % (01/12 1900) Weight:  [82.101 kg (181 lb)] 82.101 kg (181 lb) (01/12 1254)   General:   Alert,  Well-developed, well-nourished, pleasant and cooperative in NAD Head:  Normocephalic and atraumatic. Eyes:  Sclera clear, no icterus.   Conjunctiva pink. Mouth:   No ulcerations or  lesions.  Oropharynx pink & moist. Neck:   No masses or thyromegaly. Lungs:  Clear throughout to auscultation.   No wheezes, crackles, or rhonchi. No evident respiratory distress. Heart:   Slightly rapid rate, irregular rhythm consistent with A. fib; no murmurs, clicks, rubs,  or gallops. Abdomen:  Soft, mild epigastric tenderness without peritoneal findings, nontympanitic, and nondistended. No masses, hepatosplenomegaly or ventral hernias noted. Quiet bowel sounds, without bruits.   Msk:   Symmetrical without gross deformities. Pulses:  Radial pulse is slightly thready Extremities:   Without clubbing, cyanosis, or edema. Neurologic:  Alert and coherent;  grossly normal neurologically. Skin:  Intact without significant lesions or rashes. Cervical Nodes:  No significant cervical adenopathy. Psych:   Alert and cooperative. Normal mood and affect.  Intake/Output from previous day:   Intake/Output this shift:    Lab Results:  Recent Labs  03/13/14 1315  WBC 9.3  HGB 15.2*  HCT 42.8  PLT 189   BMET  Recent Labs  03/13/14 1315  NA 140  K 3.9  CL 104  CO2 24  GLUCOSE 145*  BUN 17  CREATININE 0.88  CALCIUM 9.7   LFT  Recent Labs  03/13/14 1315  PROT 7.6  ALBUMIN 4.3  AST 211*  ALT 241*  ALKPHOS 181*  BILITOT 4.3*   PT/INR No results for input(s): LABPROT, INR in the last 72 hours.  Studies/Results: Dg Chest 2 View  03/13/2014   CLINICAL DATA:  Chest pain and abdominal pain.  Vomiting.  EXAM: CHEST  2 VIEW  COMPARISON:  None.  FINDINGS: Heart size and pulmonary vascularity are normal and the lungs are clear. No acute osseous abnormality. No effusions.  IMPRESSION: No active cardiopulmonary disease.   Electronically Signed   By: Rozetta Nunnery M.D.   On: 03/13/2014 14:44   US Abdomen Complete  03/13/2014   CLINICAL DATA:  Abdominal pain and vomiting.  EXAM: ULTRASOUND ABDOMEN COMPLETE  COMPARISON:  None.  FINDINGS: Gallbladder: A single stone measuring 2.3 cm is seen  in the fundus of the gallbladder and there is a small amount of gallbladder sludge. No wall thickening or pericholecystic fluid is identified. Sonographer reports negative Murphy's sign.  Common bile duct: Diameter: 0.4 cm  Liver: No focal lesion identified. Within normal limits in parenchymal echogenicity.  IVC: Not visualized.  Pancreas: Not visualized.  Spleen: Size and appearance within normal limits.  Right Kidney: Length: 9.3 cm. Echogenicity within normal limits. No mass or hydronephrosis visualized.  Left Kidney: Length: 9.5 cm. Echogenicity within normal limits. No mass or hydronephrosis visualized.  Abdominal aorta: No aneurysm visualized.  Other findings: None.  IMPRESSION: Single 2.3 cm stone in the fundus of the gallbladder and a small amount of gallbladder sludge without evidence of cholecystitis.  The inferior vena cava and pancreas are not visualized.   Electronically Signed   By: Inge Rise M.D.   On: 03/13/2014 15:35    Impression: 1. Gallstone pancreatitis, clinically not too severe at present time 2. Cholelithiasis  Plan: 1. Aggressive fluids (I would recommend normal saline 500 ML's an hour for the next 8 hours or so, then could cut back to 250/hr). There is evidence that the first 8 hours or so R pivotal in the patient's outcome, and that aggressive hydration during this period of time can make a big difference in preventing complications such as SIRS.  2. Monitor labs and monitor clinical condition.  As long as the patient is clinically and biochemically improving, I would favor observation. If the patient shows signs of worsening, either emergency surgery or emergency ERCP may be needed.  3. Once the lipase normalizes, it would be reasonable for her to have a laparoscopic cholecystectomy. Therefore, I would favor getting general surgery on board at this time consultatively.  4. The decision about preoperative ERCP would hinge on whether the patient's LFTs substantially  normalize. If they do not, consideration should be given at least to an MRCP, if not an ERCP, preoperatively.  We will follow this patient with you.     LOS: 0 days   Royce Stegman V  03/13/2014, 7:11 PM

## 2014-03-13 NOTE — ED Notes (Signed)
Paged [place] to [employer]

## 2014-03-14 ENCOUNTER — Encounter (HOSPITAL_COMMUNITY): Payer: Self-pay | Admitting: General Practice

## 2014-03-14 DIAGNOSIS — R7989 Other specified abnormal findings of blood chemistry: Secondary | ICD-10-CM

## 2014-03-14 DIAGNOSIS — K8020 Calculus of gallbladder without cholecystitis without obstruction: Secondary | ICD-10-CM

## 2014-03-14 LAB — CBC WITH DIFFERENTIAL/PLATELET
Basophils Absolute: 0 10*3/uL (ref 0.0–0.1)
Basophils Relative: 0 % (ref 0–1)
Eosinophils Absolute: 0.1 10*3/uL (ref 0.0–0.7)
Eosinophils Relative: 1 % (ref 0–5)
HCT: 37.3 % (ref 36.0–46.0)
Hemoglobin: 12.7 g/dL (ref 12.0–15.0)
Lymphocytes Relative: 14 % (ref 12–46)
Lymphs Abs: 1.2 10*3/uL (ref 0.7–4.0)
MCH: 31.6 pg (ref 26.0–34.0)
MCHC: 34 g/dL (ref 30.0–36.0)
MCV: 92.8 fL (ref 78.0–100.0)
Monocytes Absolute: 0.6 10*3/uL (ref 0.1–1.0)
Monocytes Relative: 6 % (ref 3–12)
Neutro Abs: 6.8 10*3/uL (ref 1.7–7.7)
Neutrophils Relative %: 79 % — ABNORMAL HIGH (ref 43–77)
Platelets: 157 10*3/uL (ref 150–400)
RBC: 4.02 MIL/uL (ref 3.87–5.11)
RDW: 13.4 % (ref 11.5–15.5)
WBC: 8.7 10*3/uL (ref 4.0–10.5)

## 2014-03-14 LAB — COMPREHENSIVE METABOLIC PANEL
ALT: 155 U/L — ABNORMAL HIGH (ref 0–35)
AST: 103 U/L — ABNORMAL HIGH (ref 0–37)
Albumin: 3.3 g/dL — ABNORMAL LOW (ref 3.5–5.2)
Alkaline Phosphatase: 138 U/L — ABNORMAL HIGH (ref 39–117)
Anion gap: 9 (ref 5–15)
BUN: 15 mg/dL (ref 6–23)
CO2: 24 mmol/L (ref 19–32)
Calcium: 8.1 mg/dL — ABNORMAL LOW (ref 8.4–10.5)
Chloride: 103 mEq/L (ref 96–112)
Creatinine, Ser: 0.79 mg/dL (ref 0.50–1.10)
GFR calc Af Amer: >90 mL/min (ref >90)
GFR calc non Af Amer: 84 mL/min — ABNORMAL LOW (ref >90)
Glucose, Bld: 91 mg/dL (ref 70–99)
Potassium: 3.3 mmol/L — ABNORMAL LOW (ref 3.5–5.1)
Sodium: 136 mmol/L (ref 135–145)
Total Bilirubin: 1.2 mg/dL (ref 0.3–1.2)
Total Protein: 5.9 g/dL — ABNORMAL LOW (ref 6.0–8.3)

## 2014-03-14 LAB — APTT
aPTT: 97 seconds — ABNORMAL HIGH (ref 24–37)
aPTT: 80 seconds — ABNORMAL HIGH (ref 24–37)

## 2014-03-14 LAB — LIPASE, BLOOD: Lipase: 801 U/L — ABNORMAL HIGH (ref 11–59)

## 2014-03-14 LAB — HEPARIN LEVEL (UNFRACTIONATED): Heparin Unfractionated: 0.76 IU/mL — ABNORMAL HIGH (ref 0.30–0.70)

## 2014-03-14 MED ORDER — PANTOPRAZOLE SODIUM 40 MG IV SOLR
40.0000 mg | Freq: Every day | INTRAVENOUS | Status: DC
  Administered 2014-03-14 – 2014-03-17 (×3): 40 mg via INTRAVENOUS
  Filled 2014-03-14 (×3): qty 40

## 2014-03-14 MED ORDER — PANTOPRAZOLE SODIUM 40 MG IV SOLR
INTRAVENOUS | Status: AC
  Filled 2014-03-14: qty 40

## 2014-03-14 NOTE — Progress Notes (Signed)
[place] for Heparin Indication: atrial fibrillation  Allergies  Allergen Reactions  . Lisinopril     COUGH  . Norvasc [Amlodipine Besylate]     LEG EDEMA     Patient Measurements: Height: 5' 6.38" (168.6 cm) Weight: 184 lb 12.8 oz (83.825 kg) IBW/kg (Calculated) : 60.17 Heparin Dosing Weight:  78 kg   Vital Signs: Temp: 98 F (36.7 C) (01/13 0505) Temp Source: Oral (01/13 0505) BP: 132/73 mmHg (01/13 0505) Pulse Rate: 85 (01/13 0505)  Labs:  Recent Labs  03/13/14 1315 03/13/14 2218 03/14/14 0430 03/14/14 1203  HGB 15.2*  --  12.7  --   HCT 42.8  --  37.3  --   PLT 189  --  157  --   APTT  --  >200* 97* 80*  LABPROT  --  15.9*  --   --   INR  --  1.26  --   --   HEPARINUNFRC  --  0.90* 0.76*  --   CREATININE 0.88  --  0.79  --     Estimated Creatinine Clearance: 74 mL/min (by C-G formula based on Cr of 0.79).  Assessment: 69 yo female admitted with gallstone pancreatitis. On Eliquis PTA for AFib. Has been transitioned to heparin while inpatient. aPTT therapeutic this morning. CBC wnl and stable. Last dose of apixaban was the evening of 1/11. Planning for cholecystectomy on Friday 1/15.   Goal of Therapy:  aPTT 66-102 seconds Monitor platelets by anticoagulation protocol: Yes   Plan:  Continue heparin at 1000 units/hr Continue checking HL/aPTT until levels coorrelate Daily aPTT/HL/CBC Monitor for s/sx bleeding   Hughes Better, PharmD, BCPS Clinical Pharmacist Pager: [phone] 03/14/2014 1:04 PM

## 2014-03-14 NOTE — Progress Notes (Signed)
[place] for Heparin Indication: atrial fibrillation  Allergies  Allergen Reactions  . Lisinopril     COUGH  . Norvasc [Amlodipine Besylate]     LEG EDEMA     Patient Measurements: Height: 5' 6.38" (168.6 cm) Weight: 184 lb 12.8 oz (83.825 kg) IBW/kg (Calculated) : 60.17 Heparin Dosing Weight:  78 kg   Vital Signs: Temp: 98 F (36.7 C) (01/13 0505) Temp Source: Oral (01/13 0505) BP: 132/73 mmHg (01/13 0505) Pulse Rate: 85 (01/13 0505)  Labs:  Recent Labs  03/13/14 1315 03/13/14 2218 03/14/14 0430  HGB 15.2*  --  12.7  HCT 42.8  --  37.3  PLT 189  --  157  APTT  --  >200* 97*  LABPROT  --  15.9*  --   INR  --  1.26  --   HEPARINUNFRC  --  0.90* 0.76*  CREATININE 0.88  --  0.79    Estimated Creatinine Clearance: 74 mL/min (by C-G formula based on Cr of 0.79).  Assessment: 69 y/o Female with h/o Afib, Eliquis on hold, for heparin  Goal of Therapy:  aPTT 66-102 seconds Monitor platelets by anticoagulation protocol: Yes   Plan:  Continue Heparin at current rate F/U plan  Kayla Hale 03/14/2014,6:04 AM

## 2014-03-14 NOTE — Progress Notes (Signed)
As per Dr. Keturah Barre note, this patient has shown marked improvement overnight.   Her pain is much improved, and in fact, her bilirubin is now normal and the other LFTs are trending downward.   There has been a precipitous drop in her lipase, which nonetheless remain significantly elevated.   There has been an appropriate drop in her hemoglobin level, with hydration, which is a good prognostic sign.  She is not showing signs of SIRS, such as fever or tachycardia.  She is lying in bed in no distress, smiling, abdomen essentially nontender, even in the epigastric area.  Impression: Rapidly resolving gallstone pancreatitis  Recommendation: Continue clinical monitoring. At this point, I would defer to general surgery regarding decisions about preoperative testing such as MRCP or ERCP. However, my personal opinion is that, if the current downward trend in her numbers continues, it would probably not be necessary.  Cleotis Nipper, M.D. [phone]

## 2014-03-14 NOTE — Progress Notes (Signed)
Patient Demographics  Kayla Hale, is a 69 y.o. female, DOB - 12-18-1945, ZTI:[id number]  Admit date - 03/13/2014   Admitting Physician Florencia Reasons, MD  Outpatient Primary MD for the patient is Wenda Low, MD  LOS - 1   Chief Complaint  Patient presents with  . Abdominal Pain  . Emesis        Subjective:   Kayla Hale today has, No headache, No chest pain, Much improved epigastic abdominal pain - No Nausea, No new weakness tingling or numbness, No Cough - SOB.   Assessment & Plan    1. Gallstone pancreatitis with likely passed CBD stone. Much improved, currently almost symptom-free, liver enzymes along with lipase trending down, does have gallstones, general surgery and GI following. Continue supportive care. Will require cholecystectomy, since she was on Eliquis surgery likely on Friday. MRCP/ERCP if needed will be deferred to GI.    2. Chronic atrial fibrillation. On beta blocker which will be continued, hold Eliquis, on heparin drip.   3. Hypothyroidism. Home dose Synthroid continued.   4. GERD. IV PPI.   5. Dyslipidemia. Resume statin postop.   Code Status: Full  Family Communication: None  Disposition Plan: Home   Procedures      CT Abd Pelvis   Consults  [facility], [facility]- [facility]   Medications  Scheduled Meds: . levothyroxine  75 mcg Oral QAC breakfast  . oxybutynin  5 mg Oral BID  . pantoprazole      . pantoprazole (PROTONIX) IV  40 mg Intravenous Daily   Continuous Infusions: . sodium chloride 75 mL/hr at 03/14/14 1002  . heparin 1,000 Units/hr (03/13/14 2127)   PRN Meds:.morphine injection, ondansetron **OR** ondansetron (ZOFRAN) IV  DVT Prophylaxis   Heparin  Gtt   Lab Results  Component Value Date   PLT 157 03/14/2014    Antibiotics     Anti-infectives     None          Objective:   Filed Vitals:   03/13/14 1900 03/13/14 1915 03/13/14 1945 03/14/14 0505  BP: 132/65 119/87 129/70 132/73  Pulse: 93 62 82 85  Temp:   98.9 F (37.2 C) 98 F (36.7 C)  TempSrc:   Oral Oral  Resp: 27 15 16 18   Height:   5' 6.38" (1.686 m)   Weight:   83.825 kg (184 lb 12.8 oz)   SpO2: 90% 94% 99% 97%    Wt Readings from Last 3 Encounters:  03/13/14 83.825 kg (184 lb 12.8 oz)  12/05/13 84.097 kg (185 lb 6.4 oz)     Intake/Output Summary (Last 24 hours) at 03/14/14 1055 Last data filed at 03/14/14 0546  Gross per 24 hour  Intake 818.25 ml  Output      0 ml  Net 818.25 ml     Physical Exam  Awake Alert, Oriented X 3, No new F.N deficits, Normal affect [place].AT,PERRAL Supple Neck,No JVD, No cervical lymphadenopathy appriciated.  Symmetrical Chest wall movement, Good air movement bilaterally, CTAB RRR,No Gallops,Rubs or new Murmurs, No Parasternal Heave +ve B.Sounds, Abd Soft, No tenderness, No organomegaly appriciated, No rebound - guarding or rigidity. No Cyanosis, Clubbing or edema, No new Rash or bruise      Data Review   Micro Results  No results found for this or any previous visit (from the past 240 hour(s)).  Radiology Reports Dg Chest 2 View  03/13/2014   CLINICAL DATA:  Chest pain and abdominal pain.  Vomiting.  EXAM: CHEST  2 VIEW  COMPARISON:  None.  FINDINGS: Heart size and pulmonary vascularity are normal and the lungs are clear. No acute osseous abnormality. No effusions.  IMPRESSION: No active cardiopulmonary disease.   Electronically Signed   By: Rozetta Nunnery M.D.   On: 03/13/2014 14:44   US Abdomen Complete  03/13/2014   CLINICAL DATA:  Abdominal pain and vomiting.  EXAM: ULTRASOUND ABDOMEN COMPLETE  COMPARISON:  None.  FINDINGS: Gallbladder: A single stone measuring 2.3 cm is seen in the fundus of the gallbladder and there is a small amount of gallbladder sludge. No wall thickening or pericholecystic fluid is  identified. Sonographer reports negative Murphy's sign.  Common bile duct: Diameter: 0.4 cm  Liver: No focal lesion identified. Within normal limits in parenchymal echogenicity.  IVC: Not visualized.  Pancreas: Not visualized.  Spleen: Size and appearance within normal limits.  Right Kidney: Length: 9.3 cm. Echogenicity within normal limits. No mass or hydronephrosis visualized.  Left Kidney: Length: 9.5 cm. Echogenicity within normal limits. No mass or hydronephrosis visualized.  Abdominal aorta: No aneurysm visualized.  Other findings: None.  IMPRESSION: Single 2.3 cm stone in the fundus of the gallbladder and a small amount of gallbladder sludge without evidence of cholecystitis.  The inferior vena cava and pancreas are not visualized.   Electronically Signed   By: Inge Rise M.D.   On: 03/13/2014 15:35     CBC  Recent Labs Lab 03/13/14 1315 03/14/14 0430  WBC 9.3 8.7  HGB 15.2* 12.7  HCT 42.8 37.3  PLT 189 157  MCV 91.1 92.8  MCH 32.3 31.6  MCHC 35.5 34.0  RDW 12.9 13.4  LYMPHSABS 0.6* 1.2  MONOABS 0.4 0.6  EOSABS 0.0 0.1  BASOSABS 0.0 0.0    Chemistries   Recent Labs Lab 03/13/14 1315 03/14/14 0430  NA 140 136  K 3.9 3.3*  CL 104 103  CO2 24 24  GLUCOSE 145* 91  BUN 17 15  CREATININE 0.88 0.79  CALCIUM 9.7 8.1*  AST 211* 103*  ALT 241* 155*  ALKPHOS 181* 138*  BILITOT 4.3* 1.2   ------------------------------------------------------------------------------------------------------------------ estimated creatinine clearance is 74 mL/min (by C-G formula based on Cr of 0.79). ------------------------------------------------------------------------------------------------------------------ No results for input(s): HGBA1C in the last 72 hours. ------------------------------------------------------------------------------------------------------------------ No results for input(s): CHOL, HDL, LDLCALC, TRIG, CHOLHDL, LDLDIRECT in the last 72  hours. ------------------------------------------------------------------------------------------------------------------ No results for input(s): TSH, T4TOTAL, T3FREE, THYROIDAB in the last 72 hours.  Invalid input(s): FREET3 ------------------------------------------------------------------------------------------------------------------ No results for input(s): VITAMINB12, FOLATE, FERRITIN, TIBC, IRON, RETICCTPCT in the last 72 hours.  Coagulation profile  Recent Labs Lab 03/13/14 2218  INR 1.26    No results for input(s): DDIMER in the last 72 hours.  Cardiac Enzymes No results for input(s): CKMB, TROPONINI, MYOGLOBIN in the last 168 hours.  Invalid input(s): CK ------------------------------------------------------------------------------------------------------------------ Invalid input(s): POCBNP     Time Spent in minutes   35   Kayla Hale K M.D on 03/14/2014 at 10:55 AM  Between 7am to 7pm - Pager - [phone]  After 7pm go to [email or web address] - [facility] Office  [phone]

## 2014-03-14 NOTE — Consult Note (Signed)
[facility] Surgery Consult Note  Kayla Hale 1945/07/22  [id number].    Requesting MD: Dr. Nat Christen Chief Complaint/Reason for Consult: Gallstone pancreatitis  HPI:  69 y/o white female with PMH chronic AFIB on Eliquis, thyroid disease, HLD, CKD2, HTN who presents to [facility] complaining of N/V and abdominal pain since Sunday 03/11/14. The patient reports she ate a [employer] hamburger on Sunday and began having N/V.  She reports feeling better on 03/12/14; however, her abdominal pain and nausea returned on 03/13/14. The patient c/o epigastric and RUQ pain 8/10. Patient reports vomiting possibly 30 times today.  Patient denies diarrhea. Patient's last bowel movement was on Sunday and was normal. Patient reports chills without fever, jaundice of skin. The patient was seen by her primary care provider and given 50 mg of Phenergan at his office, with mild relief of her nausea.  Patient denies previous abdominal surgeries. No real alleviating factors, no radiating pain.  The patient denies fevers, hematemesis, dysuria, hematuria, hematochezia, sick contacts, diarrhea, CP/SOB.  Lab workup in ED revealed elevated lft, including bilirubin 4.3, and lipase >3000. Abdominal US showed single 2.3 cm stone in the fundus of the gallbladder and a small amount of gallbladder sludge without evidence of cholecystitis.  She was admitted to the hospitalist service.  We were asked to evaluate for possible lap chole this admission.  Currently her lipase is now 801, and pain is gone.  No more N/V.       ROS: All systems reviewed and otherwise negative except for as above  Family History  Problem Relation Age of Onset  . Heart attack Father   . Hypertension Father   . Heart attack Sister   . Diabetes Sister   . CVA Brother   . Diabetes Brother     Past Medical History  Diagnosis Date  . Hypertension   . Thyroid disease   . Hyperlipidemia   . Dyslipidemia   . Mild obesity   . Urinary incontinence   .  Seasonal allergies   . Shingles 2005    LOWER BACK  . Diverticulosis 2004  . Mild anemia   . Chronic kidney disease     STAGE 2    Past Surgical History  Procedure Laterality Date  . Colonoscopy      Social History:  reports that she has never smoked. She does not have any smokeless tobacco history on file. She reports that she does not drink alcohol or use illicit drugs.  Allergies:  Allergies  Allergen Reactions  . Lisinopril     COUGH  . Norvasc [Amlodipine Besylate]     LEG EDEMA     Medications Prior to Admission  Medication Sig Dispense Refill  . ALPRAZolam (XANAX) 0.5 MG tablet Take 0.5 mg by mouth See admin instructions. Takes 1 tablet once a week as needed    . apixaban (ELIQUIS) 5 MG TABS tablet Take 1 tablet (5 mg total) by mouth 2 (two) times daily. 180 tablet 3  . bisoprolol-hydrochlorothiazide (ZIAC) 10-6.25 MG per tablet Take 1 tablet by mouth daily.    . Calcium Carbonate-Vitamin D (CALCIUM-VITAMIN D) 500-200 MG-UNIT per tablet Take 1 tablet by mouth daily.    . cholecalciferol (VITAMIN D) 1000 UNITS tablet Take 1,000 Units by mouth daily.    Marland Kitchen levothyroxine (SYNTHROID, LEVOTHROID) 75 MCG tablet Take 75 mcg by mouth daily before breakfast.    . Melatonin 200 MCG TABS Take 200 mcg by mouth at bedtime as needed.    Marland Kitchen  Multiple Vitamin (MULTIVITAMIN) tablet Take 1 tablet by mouth daily.    Marland Kitchen omeprazole (PRILOSEC) 20 MG capsule Take 20 mg by mouth daily.    Marland Kitchen oxybutynin (DITROPAN) 5 MG tablet Take 5 mg by mouth See admin instructions. States she takes 2 to 3 tablets daily    . pravastatin (PRAVACHOL) 20 MG tablet Take 20 mg by mouth daily.      Blood pressure 132/73, pulse 85, temperature 98 F (36.7 C), temperature source Oral, resp. rate 18, height 5' 6.38" (1.686 m), weight 184 lb 12.8 oz (83.825 kg), SpO2 97 %. Physical Exam: General: pleasant, WD/WN white female who is laying in bed in NAD HEENT: head is normocephalic, atraumatic.  Sclera are noninjected,  icteric.  PERRL.  Ears and nose without any masses or lesions.  Mouth is pink and moist Heart: regular, rate, and rhythm.  No obvious murmurs, gallops, or rubs noted.  Palpable pedal pulses bilaterally Lungs: CTAB, no wheezes, rhonchi, or rales noted.  Respiratory effort nonlabored Abd: soft, NT/ND, +BS, no masses, hernias, or organomegaly, no scars noted MS: all 4 extremities are symmetrical with no cyanosis, clubbing, or edema. Skin: icteric, warm and dry with no masses, lesions, or rashes Psych: A&Ox3 with an appropriate affect.   Results for orders placed or performed during the hospital encounter of 03/13/14 (from the past 48 hour(s))  Comprehensive metabolic panel     Status: Abnormal   Collection Time: 03/13/14  1:15 PM  Result Value Ref Range   Sodium 140 135 - 145 mmol/L    Comment: Please note change in reference range.   Potassium 3.9 3.5 - 5.1 mmol/L    Comment: Please note change in reference range.   Chloride 104 96 - 112 mEq/L   CO2 24 19 - 32 mmol/L   Glucose, Bld 145 (H) 70 - 99 mg/dL   BUN 17 6 - 23 mg/dL   Creatinine, Ser 0.88 0.50 - 1.10 mg/dL   Calcium 9.7 8.4 - 10.5 mg/dL   Total Protein 7.6 6.0 - 8.3 g/dL   Albumin 4.3 3.5 - 5.2 g/dL   AST 211 (H) 0 - 37 U/L   ALT 241 (H) 0 - 35 U/L   Alkaline Phosphatase 181 (H) 39 - 117 U/L   Total Bilirubin 4.3 (H) 0.3 - 1.2 mg/dL   GFR calc non Af Amer 66 (L) >90 mL/min   GFR calc Af Amer 76 (L) >90 mL/min    Comment: (NOTE) The eGFR has been calculated using the CKD EPI equation. This calculation has not been validated in all clinical situations. eGFR's persistently <90 mL/min signify possible Chronic Kidney Disease.    Anion gap 12 5 - 15  CBC with Differential     Status: Abnormal   Collection Time: 03/13/14  1:15 PM  Result Value Ref Range   WBC 9.3 4.0 - 10.5 K/uL   RBC 4.70 3.87 - 5.11 MIL/uL   Hemoglobin 15.2 (H) 12.0 - 15.0 g/dL   HCT 42.8 36.0 - 46.0 %   MCV 91.1 78.0 - 100.0 fL   MCH 32.3 26.0 - 34.0  pg   MCHC 35.5 30.0 - 36.0 g/dL   RDW 12.9 11.5 - 15.5 %   Platelets 189 150 - 400 K/uL   Neutrophils Relative % 89 (H) 43 - 77 %   Neutro Abs 8.2 (H) 1.7 - 7.7 K/uL   Lymphocytes Relative 6 (L) 12 - 46 %   Lymphs Abs 0.6 (L) 0.7 - 4.0 K/uL  Monocytes Relative 5 3 - 12 %   Monocytes Absolute 0.4 0.1 - 1.0 K/uL   Eosinophils Relative 0 0 - 5 %   Eosinophils Absolute 0.0 0.0 - 0.7 K/uL   Basophils Relative 0 0 - 1 %   Basophils Absolute 0.0 0.0 - 0.1 K/uL  Lipase, blood     Status: Abnormal   Collection Time: 03/13/14  1:15 PM  Result Value Ref Range   Lipase >3000 (H) 11 - 59 U/L    Comment: RESULTS CONFIRMED BY MANUAL DILUTION  Amylase     Status: Abnormal   Collection Time: 03/13/14  1:15 PM  Result Value Ref Range   Amylase 2514 (H) 0 - 105 U/L    Comment: RESULTS CONFIRMED BY MANUAL DILUTION  I-Stat Troponin, ED (not at [facility])     Status: None   Collection Time: 03/13/14  1:25 PM  Result Value Ref Range   Troponin i, poc 0.00 0.00 - 0.08 ng/mL   Comment 3            Comment: Due to the release kinetics of cTnI, a negative result within the first hours of the onset of symptoms does not rule out myocardial infarction with certainty. If myocardial infarction is still suspected, repeat the test at appropriate intervals.   APTT     Status: Abnormal   Collection Time: 03/13/14 10:18 PM  Result Value Ref Range   aPTT >200 (HH) 24 - 37 seconds    Comment:        IF BASELINE aPTT IS ELEVATED, SUGGEST PATIENT RISK ASSESSMENT BE USED TO DETERMINE APPROPRIATE ANTICOAGULANT THERAPY. REPEATED TO VERIFY CRITICAL RESULT CALLED TO, READ BACK BY AND VERIFIED WITHMayford Knife RN [phone] GREEN R   Protime-INR     Status: Abnormal   Collection Time: 03/13/14 10:18 PM  Result Value Ref Range   Prothrombin Time 15.9 (H) 11.6 - 15.2 seconds   INR 1.26 0.00 - 1.49  Heparin level (unfractionated)     Status: Abnormal   Collection Time: 03/13/14 10:18 PM  Result Value Ref Range    Heparin Unfractionated 0.90 (H) 0.30 - 0.70 IU/mL    Comment: RESULTS CONFIRMED BY MANUAL DILUTION        IF HEPARIN RESULTS ARE BELOW EXPECTED VALUES, AND PATIENT DOSAGE HAS BEEN CONFIRMED, SUGGEST FOLLOW UP TESTING OF ANTITHROMBIN III LEVELS.   CBC with Differential     Status: Abnormal   Collection Time: 03/14/14  4:30 AM  Result Value Ref Range   WBC 8.7 4.0 - 10.5 K/uL   RBC 4.02 3.87 - 5.11 MIL/uL   Hemoglobin 12.7 12.0 - 15.0 g/dL   HCT 37.3 36.0 - 46.0 %   MCV 92.8 78.0 - 100.0 fL   MCH 31.6 26.0 - 34.0 pg   MCHC 34.0 30.0 - 36.0 g/dL   RDW 13.4 11.5 - 15.5 %   Platelets 157 150 - 400 K/uL   Neutrophils Relative % 79 (H) 43 - 77 %   Neutro Abs 6.8 1.7 - 7.7 K/uL   Lymphocytes Relative 14 12 - 46 %   Lymphs Abs 1.2 0.7 - 4.0 K/uL   Monocytes Relative 6 3 - 12 %   Monocytes Absolute 0.6 0.1 - 1.0 K/uL   Eosinophils Relative 1 0 - 5 %   Eosinophils Absolute 0.1 0.0 - 0.7 K/uL   Basophils Relative 0 0 - 1 %   Basophils Absolute 0.0 0.0 - 0.1 K/uL  Comprehensive metabolic panel  Status: Abnormal   Collection Time: 03/14/14  4:30 AM  Result Value Ref Range   Sodium 136 135 - 145 mmol/L    Comment: Please note change in reference range.   Potassium 3.3 (L) 3.5 - 5.1 mmol/L    Comment: Please note change in reference range.   Chloride 103 96 - 112 mEq/L   CO2 24 19 - 32 mmol/L   Glucose, Bld 91 70 - 99 mg/dL   BUN 15 6 - 23 mg/dL   Creatinine, Ser 0.79 0.50 - 1.10 mg/dL   Calcium 8.1 (L) 8.4 - 10.5 mg/dL   Total Protein 5.9 (L) 6.0 - 8.3 g/dL   Albumin 3.3 (L) 3.5 - 5.2 g/dL   AST 103 (H) 0 - 37 U/L   ALT 155 (H) 0 - 35 U/L   Alkaline Phosphatase 138 (H) 39 - 117 U/L   Total Bilirubin 1.2 0.3 - 1.2 mg/dL    Comment: DELTA CHECK NOTED   GFR calc non Af Amer 84 (L) >90 mL/min   GFR calc Af Amer >90 >90 mL/min    Comment: (NOTE) The eGFR has been calculated using the CKD EPI equation. This calculation has not been validated in all clinical situations. eGFR's  persistently <90 mL/min signify possible Chronic Kidney Disease.    Anion gap 9 5 - 15  Lipase, blood     Status: Abnormal   Collection Time: 03/14/14  4:30 AM  Result Value Ref Range   Lipase 801 (H) 11 - 59 U/L    Comment: RESULTS CONFIRMED BY MANUAL DILUTION  APTT     Status: Abnormal   Collection Time: 03/14/14  4:30 AM  Result Value Ref Range   aPTT 97 (H) 24 - 37 seconds    Comment:        IF BASELINE aPTT IS ELEVATED, SUGGEST PATIENT RISK ASSESSMENT BE USED TO DETERMINE APPROPRIATE ANTICOAGULANT THERAPY.   Heparin level (unfractionated)     Status: Abnormal   Collection Time: 03/14/14  4:30 AM  Result Value Ref Range   Heparin Unfractionated 0.76 (H) 0.30 - 0.70 IU/mL    Comment:        IF HEPARIN RESULTS ARE BELOW EXPECTED VALUES, AND PATIENT DOSAGE HAS BEEN CONFIRMED, SUGGEST FOLLOW UP TESTING OF ANTITHROMBIN III LEVELS.    Dg Chest 2 View  03/13/2014   CLINICAL DATA:  Chest pain and abdominal pain.  Vomiting.  EXAM: CHEST  2 VIEW  COMPARISON:  None.  FINDINGS: Heart size and pulmonary vascularity are normal and the lungs are clear. No acute osseous abnormality. No effusions.  IMPRESSION: No active cardiopulmonary disease.   Electronically Signed   By: Rozetta Nunnery M.D.   On: 03/13/2014 14:44   US Abdomen Complete  03/13/2014   CLINICAL DATA:  Abdominal pain and vomiting.  EXAM: ULTRASOUND ABDOMEN COMPLETE  COMPARISON:  None.  FINDINGS: Gallbladder: A single stone measuring 2.3 cm is seen in the fundus of the gallbladder and there is a small amount of gallbladder sludge. No wall thickening or pericholecystic fluid is identified. Sonographer reports negative Murphy's sign.  Common bile duct: Diameter: 0.4 cm  Liver: No focal lesion identified. Within normal limits in parenchymal echogenicity.  IVC: Not visualized.  Pancreas: Not visualized.  Spleen: Size and appearance within normal limits.  Right Kidney: Length: 9.3 cm. Echogenicity within normal limits. No mass or  hydronephrosis visualized.  Left Kidney: Length: 9.5 cm. Echogenicity within normal limits. No mass or hydronephrosis visualized.  Abdominal aorta: No  aneurysm visualized.  Other findings: None.  IMPRESSION: Single 2.3 cm stone in the fundus of the gallbladder and a small amount of gallbladder sludge without evidence of cholecystitis.  The inferior vena cava and pancreas are not visualized.   Electronically Signed   By: Inge Rise M.D.   On: 03/13/2014 15:35      Assessment/Plan Gallstone pancreatitis Hyperbilirubinemia Transaminitis AFIB on Eliquis - last dose was Monday night Multiple medical problems  Plan: 1.  Will follow, await improvement in lipase prior to consideration of OR.  Will likely have to wait until Friday due to Eliquis dosage on Monday night.  NPO for now since lipase is still quite elevated. 2.  IVF, pain control, antiemetics, no need for ABX at this time, WBC is normal and she is afebrile. 3.  SCD's and on heparin drip for DVT proph and AFIB 4.  Ambulate and IS    Coralie Keens, [facility] Surgery 03/14/2014, 7:25 AM Pager: [phone]

## 2014-03-14 NOTE — Progress Notes (Signed)
Critical lab APTT>200 reported R. Nyoka Cowden, [facility] at [phone].  Evalee Jefferson of pharmacy and informed that blood draw was done after the Heparin 4000 Units bolus and patient currently on Heparin 100 Units/mL drip at 51mL/hr.  No changes in rate of heparin drip and patient will have another APTT at 0500H per Pharmacist.

## 2014-03-15 ENCOUNTER — Ambulatory Visit (HOSPITAL_COMMUNITY): Payer: Medicare Other

## 2014-03-15 LAB — CBC
HCT: 36 % (ref 36.0–46.0)
Hemoglobin: 12.1 g/dL (ref 12.0–15.0)
MCH: 32 pg (ref 26.0–34.0)
MCHC: 33.6 g/dL (ref 30.0–36.0)
MCV: 95.2 fL (ref 78.0–100.0)
Platelets: 143 10*3/uL — ABNORMAL LOW (ref 150–400)
RBC: 3.78 MIL/uL — ABNORMAL LOW (ref 3.87–5.11)
RDW: 13.2 % (ref 11.5–15.5)
WBC: 8.8 10*3/uL (ref 4.0–10.5)

## 2014-03-15 LAB — COMPREHENSIVE METABOLIC PANEL
ALT: 94 U/L — ABNORMAL HIGH (ref 0–35)
AST: 42 U/L — ABNORMAL HIGH (ref 0–37)
Albumin: 2.9 g/dL — ABNORMAL LOW (ref 3.5–5.2)
Alkaline Phosphatase: 103 U/L (ref 39–117)
Anion gap: 13 (ref 5–15)
BUN: 13 mg/dL (ref 6–23)
CO2: 22 mmol/L (ref 19–32)
Calcium: 8.6 mg/dL (ref 8.4–10.5)
Chloride: 107 mEq/L (ref 96–112)
Creatinine, Ser: 0.75 mg/dL (ref 0.50–1.10)
GFR calc Af Amer: >90 mL/min (ref >90)
GFR calc non Af Amer: 85 mL/min — ABNORMAL LOW (ref >90)
Glucose, Bld: 72 mg/dL (ref 70–99)
Potassium: 3.4 mmol/L — ABNORMAL LOW (ref 3.5–5.1)
Sodium: 142 mmol/L (ref 135–145)
Total Bilirubin: 1.1 mg/dL (ref 0.3–1.2)
Total Protein: 6 g/dL (ref 6.0–8.3)

## 2014-03-15 LAB — HEPARIN LEVEL (UNFRACTIONATED): Heparin Unfractionated: 0.33 IU/mL (ref 0.30–0.70)

## 2014-03-15 LAB — APTT: aPTT: 73 seconds — ABNORMAL HIGH (ref 24–37)

## 2014-03-15 LAB — LIPASE, BLOOD: Lipase: 72 U/L — ABNORMAL HIGH (ref 11–59)

## 2014-03-15 MED ORDER — POTASSIUM CHLORIDE IN NACL 40-0.9 MEQ/L-% IV SOLN
INTRAVENOUS | Status: DC
  Administered 2014-03-15 (×2): 75 mL/h via INTRAVENOUS
  Filled 2014-03-15 (×3): qty 1000

## 2014-03-15 MED ORDER — KETOROLAC TROMETHAMINE 30 MG/ML IJ SOLN
15.0000 mg | Freq: Once | INTRAMUSCULAR | Status: AC
  Administered 2014-03-15: 15 mg via INTRAVENOUS
  Filled 2014-03-15: qty 1

## 2014-03-15 NOTE — Progress Notes (Signed)
Patient Demographics  Kayla Hale, is a 69 y.o. female, DOB - 09-17-1945, EFE:[id number]  Admit date - 03/13/2014   Admitting Physician Florencia Reasons, MD  Outpatient Primary MD for the patient is Wenda Low, MD  LOS - 2   Chief Complaint  Patient presents with  . Abdominal Pain  . Emesis        Subjective:   Kayla Hale today has, No headache, No chest pain, Much improved epigastic abdominal pain - No Nausea, No new weakness tingling or numbness, No Cough - SOB.   Assessment & Plan    1. Gallstone pancreatitis with likely passed CBD stone. Much improved, currently almost symptom-free, liver enzymes along with lipase trending down, does have gallstones, general surgery and GI following. Continue supportive care. Will require cholecystectomy, since she was on Eliquis surgery likely on Friday. MRCP/ERCP if needed will be deferred to GI/CCS.    2. Chronic atrial fibrillation. On beta blocker which will be continued, hold Eliquis, on heparin drip.   3. Hypothyroidism. Home dose Synthroid continued.   4. GERD. IV PPI.   5. Dyslipidemia. Resume statin postop.   Code Status: Full  Family Communication: Daughter bedside  Disposition Plan: Home   Procedures    CT Abd Pelvis   Consults  CCS, GI - [facility]   Medications  Scheduled Meds: . levothyroxine  75 mcg Oral QAC breakfast  . oxybutynin  5 mg Oral BID  . pantoprazole (PROTONIX) IV  40 mg Intravenous Daily   Continuous Infusions: . 0.9 % NaCl with KCl 40 mEq / L 75 mL/hr (03/15/14 0832)  . heparin 1,000 Units/hr (03/14/14 2034)   PRN Meds:.morphine injection, ondansetron **OR** ondansetron (ZOFRAN) IV  DVT Prophylaxis   Heparin  Gtt   Lab Results  Component Value Date   PLT 143* 03/15/2014    Antibiotics      Anti-infectives    None          Objective:   Filed Vitals:   03/14/14 0505 03/14/14 1325 03/14/14 2241 03/15/14 0456  BP: 132/73 121/70 129/91 138/75  Pulse: 85 107 152 102  Temp: 98 F (36.7 C) 98.4 F (36.9 C) 99.4 F (37.4 C) 98.2 F (36.8 C)  TempSrc: Oral Oral Oral Oral  Resp: 18 17 18 17   Height:      Weight:      SpO2: 97% 98% 94% 93%    Wt Readings from Last 3 Encounters:  03/13/14 83.825 kg (184 lb 12.8 oz)  12/05/13 84.097 kg (185 lb 6.4 oz)     Intake/Output Summary (Last 24 hours) at 03/15/14 0919 Last data filed at 03/15/14 0555  Gross per 24 hour  Intake 2052.75 ml  Output      0 ml  Net 2052.75 ml     Physical Exam  Awake Alert, Oriented X 3, No new F.N deficits, Normal affect [place].AT,PERRAL Supple Neck,No JVD, No cervical lymphadenopathy appriciated.  Symmetrical Chest wall movement, Good air movement bilaterally, CTAB RRR,No Gallops,Rubs or new Murmurs, No Parasternal Heave +ve B.Sounds, Abd Soft, No tenderness, No organomegaly appriciated, No rebound - guarding or rigidity. No Cyanosis, Clubbing or edema, No new Rash or bruise      Data Review   Micro Results  No results  found for this or any previous visit (from the past 240 hour(s)).  Radiology Reports Dg Chest 2 View  03/13/2014   CLINICAL DATA:  Chest pain and abdominal pain.  Vomiting.  EXAM: CHEST  2 VIEW  COMPARISON:  None.  FINDINGS: Heart size and pulmonary vascularity are normal and the lungs are clear. No acute osseous abnormality. No effusions.  IMPRESSION: No active cardiopulmonary disease.   Electronically Signed   By: Rozetta Nunnery M.D.   On: 03/13/2014 14:44   US Abdomen Complete  03/13/2014   CLINICAL DATA:  Abdominal pain and vomiting.  EXAM: ULTRASOUND ABDOMEN COMPLETE  COMPARISON:  None.  FINDINGS: Gallbladder: A single stone measuring 2.3 cm is seen in the fundus of the gallbladder and there is a small amount of gallbladder sludge. No wall thickening or  pericholecystic fluid is identified. Sonographer reports negative Murphy's sign.  Common bile duct: Diameter: 0.4 cm  Liver: No focal lesion identified. Within normal limits in parenchymal echogenicity.  IVC: Not visualized.  Pancreas: Not visualized.  Spleen: Size and appearance within normal limits.  Right Kidney: Length: 9.3 cm. Echogenicity within normal limits. No mass or hydronephrosis visualized.  Left Kidney: Length: 9.5 cm. Echogenicity within normal limits. No mass or hydronephrosis visualized.  Abdominal aorta: No aneurysm visualized.  Other findings: None.  IMPRESSION: Single 2.3 cm stone in the fundus of the gallbladder and a small amount of gallbladder sludge without evidence of cholecystitis.  The inferior vena cava and pancreas are not visualized.   Electronically Signed   By: Inge Rise M.D.   On: 03/13/2014 15:35     CBC  Recent Labs Lab 03/13/14 1315 03/14/14 0430 03/15/14 0556  WBC 9.3 8.7 8.8  HGB 15.2* 12.7 12.1  HCT 42.8 37.3 36.0  PLT 189 157 143*  MCV 91.1 92.8 95.2  MCH 32.3 31.6 32.0  MCHC 35.5 34.0 33.6  RDW 12.9 13.4 13.2  LYMPHSABS 0.6* 1.2  --   MONOABS 0.4 0.6  --   EOSABS 0.0 0.1  --   BASOSABS 0.0 0.0  --     Chemistries   Recent Labs Lab 03/13/14 1315 03/14/14 0430 03/15/14 0556  NA 140 136 142  K 3.9 3.3* 3.4*  CL 104 103 107  CO2 24 24 22   GLUCOSE 145* 91 72  BUN 17 15 13   CREATININE 0.88 0.79 0.75  CALCIUM 9.7 8.1* 8.6  AST 211* 103* 42*  ALT 241* 155* 94*  ALKPHOS 181* 138* 103  BILITOT 4.3* 1.2 1.1   ------------------------------------------------------------------------------------------------------------------ estimated creatinine clearance is 74 mL/min (by C-G formula based on Cr of 0.75). ------------------------------------------------------------------------------------------------------------------ No results for input(s): HGBA1C in the last 72  hours. ------------------------------------------------------------------------------------------------------------------ No results for input(s): CHOL, HDL, LDLCALC, TRIG, CHOLHDL, LDLDIRECT in the last 72 hours. ------------------------------------------------------------------------------------------------------------------ No results for input(s): TSH, T4TOTAL, T3FREE, THYROIDAB in the last 72 hours.  Invalid input(s): FREET3 ------------------------------------------------------------------------------------------------------------------ No results for input(s): VITAMINB12, FOLATE, FERRITIN, TIBC, IRON, RETICCTPCT in the last 72 hours.  Coagulation profile  Recent Labs Lab 03/13/14 2218  INR 1.26    No results for input(s): DDIMER in the last 72 hours.  Cardiac Enzymes No results for input(s): CKMB, TROPONINI, MYOGLOBIN in the last 168 hours.  Invalid input(s): CK ------------------------------------------------------------------------------------------------------------------ Invalid input(s): POCBNP     Time Spent in minutes   35   Lala Lund K M.D on 03/15/2014 at 9:19 AM  Between 7am to 7pm - Pager - [phone]  After 7pm go to [email or web address] - password [email or web address]  [facility] Office  [phone]

## 2014-03-15 NOTE — Progress Notes (Signed)
ANTICOAGULATION CONSULT NOTE - Follow-up Pharmacy Consult for Heparin Indication: atrial fibrillation  Allergies  Allergen Reactions  . Lisinopril     COUGH  . Norvasc [Amlodipine Besylate]     LEG EDEMA     Patient Measurements: Height: 5' 6.38" (168.6 cm) Weight: 184 lb 12.8 oz (83.825 kg) IBW/kg (Calculated) : 60.17 Heparin Dosing Weight:  78 kg   Vital Signs: Temp: 98.2 F (36.8 C) (01/14 0456) Temp Source: Oral (01/14 0456) BP: 138/75 mmHg (01/14 0456) Pulse Rate: 102 (01/14 0456)  Labs:  Recent Labs  03/13/14 1315  03/13/14 2218 03/14/14 0430 03/14/14 1203 03/15/14 0556  HGB 15.2*  --   --  12.7  --  12.1  HCT 42.8  --   --  37.3  --  36.0  PLT 189  --   --  157  --  143*  APTT  --   < > >200* 97* 80* 73*  LABPROT  --   --  15.9*  --   --   --   INR  --   --  1.26  --   --   --   HEPARINUNFRC  --   --  0.90* 0.76*  --  0.33  CREATININE 0.88  --   --  0.79  --  0.75  < > = values in this interval not displayed.  Estimated Creatinine Clearance: 74 mL/min (by C-G formula based on Cr of 0.75).  Assessment: 6 YOF admitted with gallstone pancreatitis. On Eliquis PTA for AFib. Has been transitioned to heparin while inpatient. APTT and HL are therapeutic this morning. Since these levels are now correlated - will discontinue aPTT checks and only use heparin levels for heparin monitoring. Hgb/Hct stable, plts 143 < 1567. Last dose of apixaban was the evening of 1/11. Planning for cholecystectomy on Friday 1/15.   Goal of Therapy:  Heparin level 0.3-0.7 units/ml Monitor platelets by anticoagulation protocol: Yes   Plan:  1. Continue heparin at the current rate of 1000 units/hr 2. Will continue to monitor for any signs/symptoms of bleeding and will follow up with heparin level in the a.m.   Alycia Rossetti, PharmD, BCPS Clinical Pharmacist Pager: [phone] 03/15/2014 12:47 PM

## 2014-03-15 NOTE — Progress Notes (Signed)
Plans for surgery tomorrow noted.  Doing well. Looks great, abdomen is nontender.  Lipase markedly improved, although still somewhat elevated. Liver chemistries almost completely normalized.  It appears likely that the patient's (presumed) common duct stone that led to this case of  pancreatitis has passed, and therefore, that preoperative ERCP or MRCP is not necessary.  We will sign off the patient's case, but would be happy to see the patient again if needed.  Cleotis Nipper, M.D. [phone]

## 2014-03-15 NOTE — Progress Notes (Signed)
[facility] Progress Note     Subjective: Has a little pain, no N/V.  Ambulating some OOB.  No complaints.  Objective: Vital signs in last 24 hours: Temp:  [98.2 F (36.8 C)-99.4 F (37.4 C)] 98.2 F (36.8 C) (01/14 0456) Pulse Rate:  [102-152] 102 (01/14 0456) Resp:  [17-18] 17 (01/14 0456) BP: (121-138)/(70-91) 138/75 mmHg (01/14 0456) SpO2:  [93 %-98 %] 93 % (01/14 0456) Last BM Date: 03/11/14  Intake/Output from previous day: 01/13 0701 - 01/14 0700 In: 2052.8 [I.V.:2052.8] Out: -  Intake/Output this shift:    PE: Gen:  Alert, NAD, pleasant Abd: Soft, NT/ND, +BS, no HSM   Lab Results:   Recent Labs  03/14/14 0430 03/15/14 0556  WBC 8.7 8.8  HGB 12.7 12.1  HCT 37.3 36.0  PLT 157 143*   BMET  Recent Labs  03/14/14 0430 03/15/14 0556  NA 136 142  K 3.3* 3.4*  CL 103 107  CO2 24 22  GLUCOSE 91 72  BUN 15 13  CREATININE 0.79 0.75  CALCIUM 8.1* 8.6   PT/INR  Recent Labs  03/13/14 2218  LABPROT 15.9*  INR 1.26   CMP     Component Value Date/Time   NA 142 03/15/2014 0556   K 3.4* 03/15/2014 0556   CL 107 03/15/2014 0556   CO2 22 03/15/2014 0556   GLUCOSE 72 03/15/2014 0556   BUN 13 03/15/2014 0556   CREATININE 0.75 03/15/2014 0556   CALCIUM 8.6 03/15/2014 0556   PROT 6.0 03/15/2014 0556   ALBUMIN 2.9* 03/15/2014 0556   AST 42* 03/15/2014 0556   ALT 94* 03/15/2014 0556   ALKPHOS 103 03/15/2014 0556   BILITOT 1.1 03/15/2014 0556   GFRNONAA 85* 03/15/2014 0556   GFRAA >90 03/15/2014 0556   Lipase     Component Value Date/Time   LIPASE 72* 03/15/2014 0556       Studies/Results: Dg Chest 2 View  03/13/2014   CLINICAL DATA:  Chest pain and abdominal pain.  Vomiting.  EXAM: CHEST  2 VIEW  COMPARISON:  None.  FINDINGS: Heart size and pulmonary vascularity are normal and the lungs are clear. No acute osseous abnormality. No effusions.  IMPRESSION: No active cardiopulmonary disease.   Electronically Signed   By: Rozetta Nunnery M.D.   On: 03/13/2014 14:44   US Abdomen Complete  03/13/2014   CLINICAL DATA:  Abdominal pain and vomiting.  EXAM: ULTRASOUND ABDOMEN COMPLETE  COMPARISON:  None.  FINDINGS: Gallbladder: A single stone measuring 2.3 cm is seen in the fundus of the gallbladder and there is a small amount of gallbladder sludge. No wall thickening or pericholecystic fluid is identified. Sonographer reports negative Murphy's sign.  Common bile duct: Diameter: 0.4 cm  Liver: No focal lesion identified. Within normal limits in parenchymal echogenicity.  IVC: Not visualized.  Pancreas: Not visualized.  Spleen: Size and appearance within normal limits.  Right Kidney: Length: 9.3 cm. Echogenicity within normal limits. No mass or hydronephrosis visualized.  Left Kidney: Length: 9.5 cm. Echogenicity within normal limits. No mass or hydronephrosis visualized.  Abdominal aorta: No aneurysm visualized.  Other findings: None.  IMPRESSION: Single 2.3 cm stone in the fundus of the gallbladder and a small amount of gallbladder sludge without evidence of cholecystitis.  The inferior vena cava and pancreas are not visualized.   Electronically Signed   By: Inge Rise M.D.   On: 03/13/2014 15:35    Anti-infectives: Anti-infectives    None  Assessment/Plan Gallstone pancreatitis Hyperbilirubinemia Transaminitis  AFIB on Eliquis - last dose was Monday night Multiple medical problems  Plan: 1. Will follow, lipase improved today, butwill wait until Friday to allow Eliquis to wear off. Clears today okay.  NPO after MN.  Hold heparin at midnight. 2. IVF, pain control, antiemetics, no need for ABX at this time, WBC is normal and she is afebrile. 3. SCD's and on heparin drip for DVT proph and AFIB 4. Ambulate and IS 5.  K+ per primary service    LOS: 2 days    Coralie Keens 03/15/2014, 7:48 AM Pager: [phone]

## 2014-03-16 ENCOUNTER — Encounter (HOSPITAL_COMMUNITY)
Admission: EM | Disposition: A | Discharge status: Home / Self Care | Payer: Self-pay | Source: Home / Self Care | Attending: Internal Medicine

## 2014-03-16 ENCOUNTER — Inpatient Hospital Stay (HOSPITAL_COMMUNITY): Payer: Medicare Other

## 2014-03-16 ENCOUNTER — Inpatient Hospital Stay (HOSPITAL_COMMUNITY): Payer: Medicare Other | Admitting: Anesthesiology

## 2014-03-16 ENCOUNTER — Encounter (HOSPITAL_COMMUNITY): Payer: Self-pay | Admitting: Anesthesiology

## 2014-03-16 HISTORY — PX: CHOLECYSTECTOMY: SHX55

## 2014-03-16 LAB — COMPREHENSIVE METABOLIC PANEL
ALT: 70 U/L — ABNORMAL HIGH (ref 0–35)
AST: 28 U/L (ref 0–37)
Albumin: 2.9 g/dL — ABNORMAL LOW (ref 3.5–5.2)
Alkaline Phosphatase: 94 U/L (ref 39–117)
Anion gap: 8 (ref 5–15)
BUN: 9 mg/dL (ref 6–23)
CO2: 24 mmol/L (ref 19–32)
Calcium: 8.6 mg/dL (ref 8.4–10.5)
Chloride: 105 mEq/L (ref 96–112)
Creatinine, Ser: 0.66 mg/dL (ref 0.50–1.10)
GFR calc Af Amer: >90 mL/min (ref >90)
GFR calc non Af Amer: 89 mL/min — ABNORMAL LOW (ref >90)
Glucose, Bld: 101 mg/dL — ABNORMAL HIGH (ref 70–99)
Potassium: 4 mmol/L (ref 3.5–5.1)
Sodium: 137 mmol/L (ref 135–145)
Total Bilirubin: 1.4 mg/dL — ABNORMAL HIGH (ref 0.3–1.2)
Total Protein: 6.3 g/dL (ref 6.0–8.3)

## 2014-03-16 LAB — CBC
HCT: 35.9 % — ABNORMAL LOW (ref 36.0–46.0)
Hemoglobin: 12.2 g/dL (ref 12.0–15.0)
MCH: 31.5 pg (ref 26.0–34.0)
MCHC: 34 g/dL (ref 30.0–36.0)
MCV: 92.8 fL (ref 78.0–100.0)
Platelets: 168 10*3/uL (ref 150–400)
RBC: 3.87 MIL/uL (ref 3.87–5.11)
RDW: 13.2 % (ref 11.5–15.5)
WBC: 8.9 10*3/uL (ref 4.0–10.5)

## 2014-03-16 LAB — SURGICAL PCR SCREEN
MRSA, PCR: NEGATIVE
Staphylococcus aureus: NEGATIVE

## 2014-03-16 LAB — HEPARIN LEVEL (UNFRACTIONATED): Heparin Unfractionated: 0.16 IU/mL — ABNORMAL LOW (ref 0.30–0.70)

## 2014-03-16 SURGERY — LAPAROSCOPIC CHOLECYSTECTOMY WITH INTRAOPERATIVE CHOLANGIOGRAM
Anesthesia: General | Site: Abdomen

## 2014-03-16 MED ORDER — PROPOFOL 10 MG/ML IV BOLUS
INTRAVENOUS | Status: DC | PRN
  Administered 2014-03-16: 150 mg via INTRAVENOUS

## 2014-03-16 MED ORDER — 0.9 % SODIUM CHLORIDE (POUR BTL) OPTIME
TOPICAL | Status: DC | PRN
  Administered 2014-03-16: 1000 mL

## 2014-03-16 MED ORDER — HEMOSTATIC AGENTS (NO CHARGE) OPTIME
TOPICAL | Status: DC | PRN
  Administered 2014-03-16: 1 via TOPICAL

## 2014-03-16 MED ORDER — CEFAZOLIN SODIUM-DEXTROSE 2-3 GM-% IV SOLR
INTRAVENOUS | Status: DC | PRN
  Administered 2014-03-16: 2 g via INTRAVENOUS

## 2014-03-16 MED ORDER — HEPARIN (PORCINE) IN NACL 100-0.45 UNIT/ML-% IJ SOLN
1000.0000 [IU]/h | INTRAMUSCULAR | Status: DC
  Filled 2014-03-16: qty 250

## 2014-03-16 MED ORDER — LIDOCAINE HCL (CARDIAC) 20 MG/ML IV SOLN
INTRAVENOUS | Status: DC | PRN
  Administered 2014-03-16: 10 mg via INTRAVENOUS

## 2014-03-16 MED ORDER — FENTANYL CITRATE 0.05 MG/ML IJ SOLN
INTRAMUSCULAR | Status: AC
  Filled 2014-03-16: qty 5

## 2014-03-16 MED ORDER — LACTATED RINGERS IV SOLN
INTRAVENOUS | Status: DC | PRN
  Administered 2014-03-16: 09:00:00 via INTRAVENOUS

## 2014-03-16 MED ORDER — DEXAMETHASONE SODIUM PHOSPHATE 4 MG/ML IJ SOLN
INTRAMUSCULAR | Status: AC
  Filled 2014-03-16: qty 2

## 2014-03-16 MED ORDER — PHENYLEPHRINE HCL 10 MG/ML IJ SOLN
INTRAMUSCULAR | Status: DC | PRN
  Administered 2014-03-16: 80 ug via INTRAVENOUS

## 2014-03-16 MED ORDER — GLYCOPYRROLATE 0.2 MG/ML IJ SOLN
INTRAMUSCULAR | Status: DC | PRN
  Administered 2014-03-16: 0.4 mg via INTRAVENOUS

## 2014-03-16 MED ORDER — BUPIVACAINE-EPINEPHRINE (PF) 0.25% -1:200000 IJ SOLN
INTRAMUSCULAR | Status: AC
  Filled 2014-03-16: qty 30

## 2014-03-16 MED ORDER — PHENYLEPHRINE 40 MCG/ML (10ML) SYRINGE FOR IV PUSH (FOR BLOOD PRESSURE SUPPORT)
PREFILLED_SYRINGE | INTRAVENOUS | Status: AC
  Filled 2014-03-16: qty 10

## 2014-03-16 MED ORDER — DILTIAZEM HCL 60 MG PO TABS
60.0000 mg | ORAL_TABLET | Freq: Three times a day (TID) | ORAL | Status: DC
  Administered 2014-03-16 – 2014-03-17 (×2): 60 mg via ORAL
  Filled 2014-03-16 (×2): qty 1

## 2014-03-16 MED ORDER — ONDANSETRON HCL 4 MG/2ML IJ SOLN
INTRAMUSCULAR | Status: DC | PRN
  Administered 2014-03-16: 4 mg via INTRAVENOUS

## 2014-03-16 MED ORDER — ROCURONIUM BROMIDE 100 MG/10ML IV SOLN
INTRAVENOUS | Status: DC | PRN
  Administered 2014-03-16: 10 mg via INTRAVENOUS
  Administered 2014-03-16: 30 mg via INTRAVENOUS

## 2014-03-16 MED ORDER — ESMOLOL HCL 10 MG/ML IV SOLN
INTRAVENOUS | Status: DC | PRN
Start: 2014-03-16 — End: 2014-03-16
  Administered 2014-03-16: 20 mg via INTRAVENOUS

## 2014-03-16 MED ORDER — METOPROLOL TARTRATE 1 MG/ML IV SOLN
INTRAVENOUS | Status: DC | PRN
  Administered 2014-03-16 (×5): 1 mg via INTRAVENOUS

## 2014-03-16 MED ORDER — DILTIAZEM HCL 25 MG/5ML IV SOLN
5.0000 mg | Freq: Four times a day (QID) | INTRAVENOUS | Status: DC | PRN
  Filled 2014-03-16: qty 5

## 2014-03-16 MED ORDER — DILTIAZEM LOAD VIA INFUSION
10.0000 mg | Freq: Once | INTRAVENOUS | Status: AC
  Administered 2014-03-16: 10 mg via INTRAVENOUS
  Filled 2014-03-16: qty 10

## 2014-03-16 MED ORDER — POTASSIUM CHLORIDE IN NACL 40-0.9 MEQ/L-% IV SOLN
INTRAVENOUS | Status: DC
  Administered 2014-03-16: 75 mL/h via INTRAVENOUS
  Filled 2014-03-16: qty 1000

## 2014-03-16 MED ORDER — SODIUM CHLORIDE 0.9 % IR SOLN
Status: DC | PRN
  Administered 2014-03-16: 1000 mL

## 2014-03-16 MED ORDER — LIDOCAINE HCL (CARDIAC) 20 MG/ML IV SOLN
INTRAVENOUS | Status: AC
  Filled 2014-03-16: qty 5

## 2014-03-16 MED ORDER — GLYCOPYRROLATE 0.2 MG/ML IJ SOLN
INTRAMUSCULAR | Status: AC
  Filled 2014-03-16: qty 2

## 2014-03-16 MED ORDER — MIDAZOLAM HCL 5 MG/5ML IJ SOLN
INTRAMUSCULAR | Status: DC | PRN
  Administered 2014-03-16: 2 mg via INTRAVENOUS

## 2014-03-16 MED ORDER — OXYCODONE HCL 5 MG PO TABS
5.0000 mg | ORAL_TABLET | Freq: Once | ORAL | Status: AC | PRN
  Administered 2014-03-16: 5 mg via ORAL

## 2014-03-16 MED ORDER — ONDANSETRON HCL 4 MG/2ML IJ SOLN: 4.0000 mg | Freq: Once | INTRAMUSCULAR | Status: DC | PRN

## 2014-03-16 MED ORDER — PROPOFOL 10 MG/ML IV BOLUS
INTRAVENOUS | Status: AC
  Filled 2014-03-16: qty 20

## 2014-03-16 MED ORDER — OXYCODONE-ACETAMINOPHEN 5-325 MG PO TABS
1.0000 | ORAL_TABLET | ORAL | Status: DC | PRN
  Administered 2014-03-16: 1 via ORAL
  Filled 2014-03-16: qty 1

## 2014-03-16 MED ORDER — CEFAZOLIN SODIUM-DEXTROSE 2-3 GM-% IV SOLR
INTRAVENOUS | Status: AC
  Filled 2014-03-16: qty 50

## 2014-03-16 MED ORDER — FENTANYL CITRATE 0.05 MG/ML IJ SOLN
INTRAMUSCULAR | Status: DC | PRN
Start: 2014-03-16 — End: 2014-03-16
  Administered 2014-03-16: 150 ug via INTRAVENOUS
  Administered 2014-03-16 (×2): 50 ug via INTRAVENOUS

## 2014-03-16 MED ORDER — FENTANYL CITRATE 0.05 MG/ML IJ SOLN
25.0000 ug | INTRAMUSCULAR | Status: DC | PRN
  Administered 2014-03-16: 25 ug via INTRAVENOUS

## 2014-03-16 MED ORDER — OXYCODONE HCL 5 MG PO TABS
ORAL_TABLET | ORAL | Status: AC
  Administered 2014-03-16: 5 mg via ORAL
  Filled 2014-03-16: qty 1

## 2014-03-16 MED ORDER — OXYCODONE HCL 5 MG/5ML PO SOLN: 5.0000 mg | Freq: Once | ORAL | Status: AC | PRN

## 2014-03-16 MED ORDER — NEOSTIGMINE METHYLSULFATE 10 MG/10ML IV SOLN
INTRAVENOUS | Status: DC | PRN
Start: 2014-03-16 — End: 2014-03-16
  Administered 2014-03-16: 3 mg via INTRAVENOUS

## 2014-03-16 MED ORDER — ROCURONIUM BROMIDE 50 MG/5ML IV SOLN
INTRAVENOUS | Status: AC
  Filled 2014-03-16: qty 1

## 2014-03-16 MED ORDER — BUPIVACAINE-EPINEPHRINE 0.25% -1:200000 IJ SOLN
INTRAMUSCULAR | Status: DC | PRN
  Administered 2014-03-16: 30 mL

## 2014-03-16 MED ORDER — LACTATED RINGERS IV SOLN: INTRAVENOUS | Status: DC

## 2014-03-16 MED ORDER — FENTANYL CITRATE 0.05 MG/ML IJ SOLN
INTRAMUSCULAR | Status: AC
  Filled 2014-03-16: qty 2

## 2014-03-16 MED ORDER — DILTIAZEM HCL 100 MG IV SOLR
5.0000 mg/h | INTRAVENOUS | Status: DC
  Administered 2014-03-16: 5 mg/h via INTRAVENOUS
  Filled 2014-03-16: qty 100

## 2014-03-16 MED ORDER — MIDAZOLAM HCL 2 MG/2ML IJ SOLN
INTRAMUSCULAR | Status: AC
  Filled 2014-03-16: qty 2

## 2014-03-16 MED ORDER — NEOSTIGMINE METHYLSULFATE 10 MG/10ML IV SOLN
INTRAVENOUS | Status: AC
  Filled 2014-03-16: qty 1

## 2014-03-16 MED ORDER — DEXAMETHASONE SODIUM PHOSPHATE 4 MG/ML IJ SOLN
INTRAMUSCULAR | Status: DC | PRN
  Administered 2014-03-16: 8 mg via INTRAVENOUS

## 2014-03-16 MED ORDER — ONDANSETRON HCL 4 MG/2ML IJ SOLN
INTRAMUSCULAR | Status: AC
  Filled 2014-03-16: qty 2

## 2014-03-16 MED ORDER — LACTATED RINGERS IV SOLN
INTRAVENOUS | Status: DC | PRN
  Administered 2014-03-16: 08:00:00 via INTRAVENOUS

## 2014-03-16 MED ORDER — IOHEXOL 300 MG/ML  SOLN
INTRAMUSCULAR | Status: DC | PRN
  Administered 2014-03-16: 08:00:00

## 2014-03-16 SURGICAL SUPPLY — 44 items
APPLIER CLIP 5 13 M/L LIGAMAX5 (MISCELLANEOUS) ×2
APR CLP MED LRG 5 ANG JAW (MISCELLANEOUS) ×1
BAG SPEC RTRVL 10 TROC 200 (ENDOMECHANICALS) ×1
CANISTER SUCTION 2500CC (MISCELLANEOUS) ×2 IMPLANT
CHLORAPREP W/TINT 26ML (MISCELLANEOUS) ×2 IMPLANT
CLIP APPLIE 5 13 M/L LIGAMAX5 (MISCELLANEOUS) ×1 IMPLANT
COVER MAYO STAND STRL (DRAPES) ×2 IMPLANT
COVER SURGICAL LIGHT HANDLE (MISCELLANEOUS) ×2 IMPLANT
DEVICE TROCAR PUNCTURE CLOSURE (ENDOMECHANICALS) IMPLANT
DRAPE C-ARM 42X72 X-RAY (DRAPES) ×2 IMPLANT
DRAPE LAPAROSCOPIC ABDOMINAL (DRAPES) ×2 IMPLANT
ELECT REM PT RETURN 9FT ADLT (ELECTROSURGICAL) ×2
ELECTRODE REM PT RTRN 9FT ADLT (ELECTROSURGICAL) ×1 IMPLANT
GLOVE BIO SURGEON STRL SZ 6.5 (GLOVE) ×1 IMPLANT
GLOVE BIO SURGEON STRL SZ7 (GLOVE) ×3 IMPLANT
GLOVE BIO SURGEON STRL SZ7.5 (GLOVE) ×2 IMPLANT
GLOVE BIOGEL PI IND STRL 7.0 (GLOVE) ×1 IMPLANT
GLOVE BIOGEL PI IND STRL 7.5 (GLOVE) ×1 IMPLANT
GLOVE BIOGEL PI INDICATOR 7.0 (GLOVE) ×1
GLOVE BIOGEL PI INDICATOR 7.5 (GLOVE) ×3
GOWN STRL REUS W/ TWL LRG LVL3 (GOWN DISPOSABLE) ×3 IMPLANT
GOWN STRL REUS W/TWL LRG LVL3 (GOWN DISPOSABLE) ×8
HEMOSTAT SNOW SURGICEL 2X4 (HEMOSTASIS) ×1 IMPLANT
KIT BASIN OR (CUSTOM PROCEDURE TRAY) ×2 IMPLANT
KIT ROOM TURNOVER OR (KITS) ×2 IMPLANT
LIQUID BAND (GAUZE/BANDAGES/DRESSINGS) ×2 IMPLANT
NS IRRIG 1000ML POUR BTL (IV SOLUTION) ×2 IMPLANT
PAD ARMBOARD 7.5X6 YLW CONV (MISCELLANEOUS) ×2 IMPLANT
POUCH RETRIEVAL ECOSAC 10 (ENDOMECHANICALS) ×1 IMPLANT
POUCH RETRIEVAL ECOSAC 10MM (ENDOMECHANICALS) ×1
SCISSORS LAP 5X35 DISP (ENDOMECHANICALS) ×2 IMPLANT
SET CHOLANGIOGRAPH 5 50 .035 (SET/KITS/TRAYS/PACK) ×2 IMPLANT
SET IRRIG TUBING LAPAROSCOPIC (IRRIGATION / IRRIGATOR) ×2 IMPLANT
SLEEVE ENDOPATH XCEL 5M (ENDOMECHANICALS) ×4 IMPLANT
SPECIMEN JAR SMALL (MISCELLANEOUS) ×2 IMPLANT
STRIP CLOSURE SKIN 1/2X4 (GAUZE/BANDAGES/DRESSINGS) IMPLANT
SUT MNCRL AB 4-0 PS2 18 (SUTURE) ×2 IMPLANT
SUT VICRYL 0 UR6 27IN ABS (SUTURE) IMPLANT
TOWEL OR 17X24 6PK STRL BLUE (TOWEL DISPOSABLE) ×2 IMPLANT
TOWEL OR 17X26 10 PK STRL BLUE (TOWEL DISPOSABLE) ×2 IMPLANT
TRAY LAPAROSCOPIC (CUSTOM PROCEDURE TRAY) ×2 IMPLANT
TROCAR XCEL BLUNT TIP 100MML (ENDOMECHANICALS) ×2 IMPLANT
TROCAR XCEL NON-BLD 5MMX100MML (ENDOMECHANICALS) ×2 IMPLANT
TUBING INSUFFLATION (TUBING) ×2 IMPLANT

## 2014-03-16 NOTE — Op Note (Signed)
Preoperative diagnosis: gallstone pancreatitis Postoperative diagnosis: same as above Procedure: Laparoscopic cholecystectomy with cholangiogram Surgeon: Dr. Serita Grammes Anesthesia: Gen. Estimated blood loss: Minimal Complications: None Drains: None Specimens: Gallbladder and contents to pathology Sponge count correct at completion Description to recovery stable  Indications: This a 17 yof admitted with gallstone pancreatitis that has resolved.  I discussed laparoscopic cholecystectomy along with risks and benefits.   Procedure: After informed consent was obtained she was taken to the operating room. She was given ancef. Sequential compression devices were on her legs. She was placed under general anesthesia without complication. Her abdomen was prepped and draped in the standard sterile surgical fashion. A surgical timeout was then performed.  I infiltrated marcaine below her umbilicus. I made a vertical incision and entered into the peritoneum bluntly. A 0 vicryl pursestring was placed and a hasson trocar were introduced.The abdomen was insufflated to 15 mm Hg pressure.. I then placed 3 further 5 mm trocars in the epigastrium and right upper quadrant under direct vision without complication. I then was able to grasp the gallbladder. I was able to dissect the ductal structures. I identified the critical view of safety. I clipped and divided the cystic artery. I then clipped the duct distally. I made a ductotomy and introduced a cook catheter.I filled both sides of the liver proximally confirming position in the cystic duct. There did not appear to be any stones and the duodenum filledI then removed the catheter and clipped the duct. I divided this. These clips completely traversed the duct and the duct was viable. I then removed the gallbladder from the liver bed without difficulty.  This was then placed in a bag. This was eventually removed from the umbilicus. Hemostasis was observed. I  removed my hasson trocar and closed my pursestring suture. After I closed this I then removed the trocars and desufflated the abdomen. I then closed the skin with 4-0 Monocryl and glue. Steri-Strips were placed over this. She tolerated this was exttubated and transferred to the recovery room in stable condition.  Her atrial fibrillation required multiple medications and have discussed with medicine. will have her go to telemetry for monitoring on cardizem drip

## 2014-03-16 NOTE — Transfer of Care (Signed)
Immediate Anesthesia Transfer of Care Note  Patient: Kayla Hale  Procedure(s) Performed: Procedure(s): LAPAROSCOPIC CHOLECYSTECTOMY WITH INTRAOPERATIVE CHOLANGIOGRAM (N/A)  Patient Location: PACU  Anesthesia Type:General  Level of Consciousness: awake, alert , oriented and patient cooperative  Airway & Oxygen Therapy: Patient Spontanous Breathing and Patient connected to nasal cannula oxygen  Post-op Assessment: Report given to PACU RN and Post -op Vital signs reviewed and stable  Post vital signs: Reviewed and stable  Complications: No apparent anesthesia complications

## 2014-03-16 NOTE — Care Management (Signed)
IM from Medicine given. Magdalen Spatz RN BSN

## 2014-03-16 NOTE — Progress Notes (Signed)
Patient Demographics  Kayla Hale, is a 69 y.o. female, DOB - 07/11/1945, BOF:[id number]  Admit date - 03/13/2014   Admitting Physician Florencia Reasons, MD  Outpatient Primary MD for the patient is Wenda Low, MD  LOS - 3   Chief Complaint  Patient presents with  . Abdominal Pain  . Emesis        Subjective:   Kayla Hale today has, No headache, No chest pain, Much improved epigastic abdominal pain - No Nausea, No new weakness tingling or numbness, No Cough - SOB. In PACU doing well.  Assessment & Plan    1. Gallstone pancreatitis with likely passed CBD stone. Much improved, currently almost symptom-free, liver enzymes along with lipase trending down, does have gallstones, general surgery and GI following. Continue supportive care. Post laparoscopic cholecystectomy on 03/16/2014. MRCP/ERCP if needed will be deferred to GI/CCS.    2. Chronic atrial fibrillation. In RVR on 03/16/2014, since nothing by mouth for surgery was placed on Cardizem drip with good effect, hold Eliquis, on heparin drip. We will place on oral Cardizem and titrate of Cardizem once taking by mouth. Last EF is 55-60% few months ago with mild LVH.   3. Hypothyroidism. Home dose Synthroid continued.   4. GERD. IV PPI.   5. Dyslipidemia. Resume statin postop.   Code Status: Full  Family Communication: Daughter bedside  Disposition Plan: Home   Procedures    CT Abd Pelvis   Consults  CCS, GI - [facility]   Medications  Scheduled Meds: . diltiazem  10 mg Intravenous Once  . [MAR Hold] levothyroxine  75 mcg Oral QAC breakfast  . [MAR Hold] oxybutynin  5 mg Oral BID  . [MAR Hold] pantoprazole (PROTONIX) IV  40 mg Intravenous Daily   Continuous Infusions: . 0.9 % NaCl with KCl 40 mEq / L 75 mL/hr (03/15/14 2132)  .  diltiazem (CARDIZEM) infusion    . heparin 1,000 Units/hr (03/15/14 1833)  . lactated ringers     PRN Meds:.[MAR Hold] diltiazem, [MAR Hold]  morphine injection, [MAR Hold] ondansetron **OR** [MAR Hold] ondansetron (ZOFRAN) IV  DVT Prophylaxis   Heparin  Gtt   Lab Results  Component Value Date   PLT 168 03/16/2014    Antibiotics     Anti-infectives    None          Objective:   Filed Vitals:   03/16/14 0739 03/16/14 1015 03/16/14 1030 03/16/14 1045  BP:  138/94 120/76 134/79  Pulse: 100 126 78 103  Temp:  98.1 F (36.7 C)    TempSrc:      Resp:  27 18 26   Height:      Weight:      SpO2:  96% 96% 96%    Wt Readings from Last 3 Encounters:  03/13/14 83.825 kg (184 lb 12.8 oz)  12/05/13 84.097 kg (185 lb 6.4 oz)     Intake/Output Summary (Last 24 hours) at 03/16/14 1121 Last data filed at 03/16/14 1015  Gross per 24 hour  Intake   2400 ml  Output     40 ml  Net   2360 ml     Physical Exam  Awake Alert, Oriented X 3, No new F.N deficits, Normal affect [place].AT,PERRAL Supple Neck,No  JVD, No cervical lymphadenopathy appriciated.  Symmetrical Chest wall movement, Good air movement bilaterally, CTAB RRR,No Gallops,Rubs or new Murmurs, No Parasternal Heave +ve B.Sounds, Abd Soft, No tenderness, No organomegaly appriciated, No rebound - guarding or rigidity. No Cyanosis, Clubbing or edema, No new Rash or bruise      Data Review   Micro Results  Recent Results (from the past 240 hour(s))  Surgical pcr screen     Status: None   Collection Time: 03/16/14  7:55 AM  Result Value Ref Range Status   MRSA, PCR NEGATIVE NEGATIVE Final   Staphylococcus aureus NEGATIVE NEGATIVE Final    Comment:        The Xpert SA Assay (FDA approved for NASAL specimens in patients over 74 years of age), is one component of a comprehensive surveillance program.  Test performance has been validated by [facility] for patients greater than or equal to 24 year old. It is not  intended to diagnose infection nor to guide or monitor treatment.     Radiology Reports Dg Chest 2 View  03/13/2014   CLINICAL DATA:  Chest pain and abdominal pain.  Vomiting.  EXAM: CHEST  2 VIEW  COMPARISON:  None.  FINDINGS: Heart size and pulmonary vascularity are normal and the lungs are clear. No acute osseous abnormality. No effusions.  IMPRESSION: No active cardiopulmonary disease.   Electronically Signed   By: Rozetta Nunnery M.D.   On: 03/13/2014 14:44   US Abdomen Complete  03/13/2014   CLINICAL DATA:  Abdominal pain and vomiting.  EXAM: ULTRASOUND ABDOMEN COMPLETE  COMPARISON:  None.  FINDINGS: Gallbladder: A single stone measuring 2.3 cm is seen in the fundus of the gallbladder and there is a small amount of gallbladder sludge. No wall thickening or pericholecystic fluid is identified. Sonographer reports negative Murphy's sign.  Common bile duct: Diameter: 0.4 cm  Liver: No focal lesion identified. Within normal limits in parenchymal echogenicity.  IVC: Not visualized.  Pancreas: Not visualized.  Spleen: Size and appearance within normal limits.  Right Kidney: Length: 9.3 cm. Echogenicity within normal limits. No mass or hydronephrosis visualized.  Left Kidney: Length: 9.5 cm. Echogenicity within normal limits. No mass or hydronephrosis visualized.  Abdominal aorta: No aneurysm visualized.  Other findings: None.  IMPRESSION: Single 2.3 cm stone in the fundus of the gallbladder and a small amount of gallbladder sludge without evidence of cholecystitis.  The inferior vena cava and pancreas are not visualized.   Electronically Signed   By: Inge Rise M.D.   On: 03/13/2014 15:35     CBC  Recent Labs Lab 03/13/14 1315 03/14/14 0430 03/15/14 0556 03/16/14 0624  WBC 9.3 8.7 8.8 8.9  HGB 15.2* 12.7 12.1 12.2  HCT 42.8 37.3 36.0 35.9*  PLT 189 157 143* 168  MCV 91.1 92.8 95.2 92.8  MCH 32.3 31.6 32.0 31.5  MCHC 35.5 34.0 33.6 34.0  RDW 12.9 13.4 13.2 13.2  LYMPHSABS 0.6* 1.2  --    --   MONOABS 0.4 0.6  --   --   EOSABS 0.0 0.1  --   --   BASOSABS 0.0 0.0  --   --     Chemistries   Recent Labs Lab 03/13/14 1315 03/14/14 0430 03/15/14 0556 03/16/14 0624  NA 140 136 142 137  K 3.9 3.3* 3.4* 4.0  CL 104 103 107 105  CO2 24 24 22 24   GLUCOSE 145* 91 72 101*  BUN 17 15 13 9   CREATININE 0.88 0.79  0.75 0.66  CALCIUM 9.7 8.1* 8.6 8.6  AST 211* 103* 42* 28  ALT 241* 155* 94* 70*  ALKPHOS 181* 138* 103 94  BILITOT 4.3* 1.2 1.1 1.4*   ------------------------------------------------------------------------------------------------------------------ estimated creatinine clearance is 74 mL/min (by C-G formula based on Cr of 0.66). ------------------------------------------------------------------------------------------------------------------ No results for input(s): HGBA1C in the last 72 hours. ------------------------------------------------------------------------------------------------------------------ No results for input(s): CHOL, HDL, LDLCALC, TRIG, CHOLHDL, LDLDIRECT in the last 72 hours. ------------------------------------------------------------------------------------------------------------------ No results for input(s): TSH, T4TOTAL, T3FREE, THYROIDAB in the last 72 hours.  Invalid input(s): FREET3 ------------------------------------------------------------------------------------------------------------------ No results for input(s): VITAMINB12, FOLATE, FERRITIN, TIBC, IRON, RETICCTPCT in the last 72 hours.  Coagulation profile  Recent Labs Lab 03/13/14 2218  INR 1.26    No results for input(s): DDIMER in the last 72 hours.  Cardiac Enzymes No results for input(s): CKMB, TROPONINI, MYOGLOBIN in the last 168 hours.  Invalid input(s): CK ------------------------------------------------------------------------------------------------------------------ Invalid input(s): POCBNP     Time Spent in minutes   35   Kaito Schulenburg K  M.D on 03/16/2014 at 11:21 AM  Between 7am to 7pm - Pager - [phone]  After 7pm go to [email or web address] - [facility] Office  [phone]

## 2014-03-16 NOTE — Progress Notes (Signed)
ANTICOAGULATION CONSULT NOTE - Follow-up  Pharmacy Consult for Heparin Indication: atrial fibrillation  Allergies  Allergen Reactions  . Lisinopril     COUGH  . Norvasc [Amlodipine Besylate]     LEG EDEMA     Patient Measurements: Height: 5' 6.38" (168.6 cm) Weight: 184 lb 12.8 oz (83.825 kg) IBW/kg (Calculated) : 60.17 Heparin Dosing Weight:  78 kg   Vital Signs: Temp: 97.5 F (36.4 C) (01/15 1145) BP: 122/71 mmHg (01/15 1425) Pulse Rate: 79 (01/15 1425)  Labs:  Recent Labs  03/13/14 2218  03/14/14 0430 03/14/14 1203 03/15/14 0556 03/16/14 0624  HGB  --   < > 12.7  --  12.1 12.2  HCT  --   --  37.3  --  36.0 35.9*  PLT  --   --  157  --  143* 168  APTT >200*  --  97* 80* 73*  --   LABPROT 15.9*  --   --   --   --   --   INR 1.26  --   --   --   --   --   HEPARINUNFRC 0.90*  --  0.76*  --  0.33 0.16*  CREATININE  --   --  0.79  --  0.75 0.66  < > = values in this interval not displayed.  Estimated Creatinine Clearance: 74 mL/min (by C-G formula based on Cr of 0.66).  Assessment: 31 YOF admitted with gallstone pancreatitis now s/p lap-chole with cholangiogram. On Eliquis PTA for AFib but was transitioned to heparin prior to surgery. Heparin and aPTT levels are correlating so only using heparin levels for monitoring.   Spoke with Dr. Donne Hazel and will resume heparin drip Sat, 1/16 at 08:00 with no bolus. No overt bleeding, CBC is stable.  Goal of Therapy:  Heparin level 0.3-0.7 units/ml Monitor platelets by anticoagulation protocol: Yes   Plan:  - Resume heparin drip at 1000 units/hr with no bolus on Sat, 1/16 at 08:00 - 6 hr heparin level - Daily heparin level and CBC - Monitor for s/sx of bleeding  [facility], [place].D., BCPS Clinical Pharmacist Pager: [phone] 03/16/2014 3:18 PM

## 2014-03-16 NOTE — Anesthesia Postprocedure Evaluation (Signed)
  Anesthesia Post-op Note  Patient: Kayla Hale  Procedure(s) Performed: Procedure(s): LAPAROSCOPIC CHOLECYSTECTOMY WITH INTRAOPERATIVE CHOLANGIOGRAM (N/A)  Patient Location: PACU  Anesthesia Type:General  Level of Consciousness: awake, alert  and oriented  Airway and Oxygen Therapy: Patient Spontanous Breathing and Patient connected to nasal cannula oxygen  Post-op Pain: mild  Post-op Assessment: Post-op Vital signs reviewed, Patient's Cardiovascular Status Stable, Respiratory Function Stable, Patent Airway and No signs of Nausea or vomiting  Post-op Vital Signs: stable  Last Vitals:  Filed Vitals:   03/16/14 1340  BP: 113/69  Pulse: 76  Temp:   Resp: 25    Complications: No apparent anesthesia complications

## 2014-03-16 NOTE — Anesthesia Preprocedure Evaluation (Addendum)
Anesthesia Evaluation  Patient identified by MRN, date of birth, ID band Patient awake    Reviewed: Allergy & Precautions, NPO status , Patient's Chart, lab work & pertinent test results, reviewed documented beta blocker date and time   History of Anesthesia Complications Negative for: history of anesthetic complications  Airway        Dental   Pulmonary former smoker,          Cardiovascular hypertension, Pt. on medications and Pt. on home beta blockers     Neuro/Psych negative neurological ROS  negative psych ROS   GI/Hepatic GERD-  Medicated and Controlled,  Endo/Other  Hypothyroidism   Renal/GU      Musculoskeletal   Abdominal   Peds  Hematology  (+) anemia ,   Anesthesia Other Findings   Reproductive/Obstetrics                            Anesthesia Physical Anesthesia Plan  ASA: III  Anesthesia Plan: General   Post-op Pain Management:    Induction: Intravenous  Airway Management Planned: Oral ETT  Additional Equipment:   Intra-op Plan:   Post-operative Plan:   Informed Consent: I have reviewed the patients History and Physical, chart, labs and discussed the procedure including the risks, benefits and alternatives for the proposed anesthesia with the patient or authorized representative who has indicated his/her understanding and acceptance.   Dental advisory given  Plan Discussed with: Anesthesiologist  Anesthesia Plan Comments:         Anesthesia Quick Evaluation

## 2014-03-17 LAB — COMPREHENSIVE METABOLIC PANEL
ALT: 66 U/L — ABNORMAL HIGH (ref 0–35)
AST: 36 U/L (ref 0–37)
Albumin: 2.9 g/dL — ABNORMAL LOW (ref 3.5–5.2)
Alkaline Phosphatase: 92 U/L (ref 39–117)
Anion gap: 4 — ABNORMAL LOW (ref 5–15)
BUN: 10 mg/dL (ref 6–23)
CO2: 27 mmol/L (ref 19–32)
Calcium: 8.8 mg/dL (ref 8.4–10.5)
Chloride: 105 mEq/L (ref 96–112)
Creatinine, Ser: 0.66 mg/dL (ref 0.50–1.10)
GFR calc Af Amer: >90 mL/min (ref >90)
GFR calc non Af Amer: 89 mL/min — ABNORMAL LOW (ref >90)
Glucose, Bld: 124 mg/dL — ABNORMAL HIGH (ref 70–99)
Potassium: 4.6 mmol/L (ref 3.5–5.1)
Sodium: 136 mmol/L (ref 135–145)
Total Bilirubin: 0.8 mg/dL (ref 0.3–1.2)
Total Protein: 6.3 g/dL (ref 6.0–8.3)

## 2014-03-17 LAB — CBC
HCT: 32.5 % — ABNORMAL LOW (ref 36.0–46.0)
Hemoglobin: 11.1 g/dL — ABNORMAL LOW (ref 12.0–15.0)
MCH: 32 pg (ref 26.0–34.0)
MCHC: 34.2 g/dL (ref 30.0–36.0)
MCV: 93.7 fL (ref 78.0–100.0)
Platelets: 166 10*3/uL (ref 150–400)
RBC: 3.47 MIL/uL — ABNORMAL LOW (ref 3.87–5.11)
RDW: 13.1 % (ref 11.5–15.5)
WBC: 7.6 10*3/uL (ref 4.0–10.5)

## 2014-03-17 LAB — HEPARIN LEVEL (UNFRACTIONATED): Heparin Unfractionated: 0.32 IU/mL (ref 0.30–0.70)

## 2014-03-17 MED ORDER — SODIUM CHLORIDE 0.9 % IV BOLUS (SEPSIS)
250.0000 mL | Freq: Once | INTRAVENOUS | Status: AC
  Administered 2014-03-17: 250 mL via INTRAVENOUS

## 2014-03-17 MED ORDER — POLYETHYLENE GLYCOL 3350 17 G PO PACK
17.0000 g | PACK | Freq: Two times a day (BID) | ORAL | Status: DC
  Administered 2014-03-17 – 2014-03-18 (×4): 17 g via ORAL
  Filled 2014-03-17 (×5): qty 1

## 2014-03-17 MED ORDER — DILTIAZEM LOAD VIA INFUSION: 10.0000 mg | Freq: Four times a day (QID) | INTRAVENOUS | Status: DC | PRN

## 2014-03-17 MED ORDER — DILTIAZEM HCL 60 MG PO TABS
60.0000 mg | ORAL_TABLET | Freq: Once | ORAL | Status: AC
  Administered 2014-03-17: 60 mg via ORAL
  Filled 2014-03-17: qty 1

## 2014-03-17 MED ORDER — POLYETHYLENE GLYCOL 3350 17 G PO PACK: 17.0000 g | PACK | Freq: Two times a day (BID) | ORAL | Status: DC

## 2014-03-17 MED ORDER — HEPARIN (PORCINE) IN NACL 100-0.45 UNIT/ML-% IJ SOLN
1100.0000 [IU]/h | INTRAMUSCULAR | Status: DC
  Administered 2014-03-17 – 2014-03-18 (×2): 1000 [IU]/h via INTRAVENOUS
  Filled 2014-03-17 (×2): qty 250

## 2014-03-17 MED ORDER — APIXABAN 5 MG PO TABS: 5.0000 mg | ORAL_TABLET | Freq: Two times a day (BID) | ORAL | Status: DC

## 2014-03-17 MED ORDER — DILTIAZEM HCL ER COATED BEADS 240 MG PO CP24
240.0000 mg | ORAL_CAPSULE | Freq: Every day | ORAL | Status: DC
  Administered 2014-03-18: 240 mg via ORAL
  Filled 2014-03-17: qty 1

## 2014-03-17 MED ORDER — MAGNESIUM HYDROXIDE 400 MG/5ML PO SUSP
30.0000 mL | Freq: Two times a day (BID) | ORAL | Status: AC
  Administered 2014-03-17 (×2): 30 mL via ORAL
  Filled 2014-03-17 (×2): qty 30

## 2014-03-17 MED ORDER — DILTIAZEM HCL 25 MG/5ML IV SOLN
10.0000 mg | Freq: Once | INTRAVENOUS | Status: AC
  Administered 2014-03-17: 10 mg via INTRAVENOUS
  Filled 2014-03-17: qty 5

## 2014-03-17 MED ORDER — DILTIAZEM HCL 60 MG PO TABS: 90.0000 mg | ORAL_TABLET | Freq: Once | ORAL | Status: DC

## 2014-03-17 MED ORDER — DILTIAZEM HCL ER COATED BEADS 180 MG PO CP24: 180.0000 mg | ORAL_CAPSULE | Freq: Every day | ORAL | Status: DC

## 2014-03-17 MED ORDER — OXYCODONE-ACETAMINOPHEN 5-325 MG PO TABS: 1.0000 | ORAL_TABLET | ORAL | Status: DC | PRN

## 2014-03-17 MED ORDER — ALPRAZOLAM 0.5 MG PO TABS
0.5000 mg | ORAL_TABLET | Freq: Every evening | ORAL | Status: DC | PRN
  Administered 2014-03-17 – 2014-03-18 (×2): 0.5 mg via ORAL
  Filled 2014-03-17 (×2): qty 1

## 2014-03-17 MED ORDER — PRAVASTATIN SODIUM 20 MG PO TABS
20.0000 mg | ORAL_TABLET | Freq: Every day | ORAL | Status: DC
  Administered 2014-03-17 – 2014-03-18 (×2): 20 mg via ORAL
  Filled 2014-03-17 (×2): qty 1

## 2014-03-17 MED ORDER — DILTIAZEM HCL ER COATED BEADS 180 MG PO CP24
180.0000 mg | ORAL_CAPSULE | Freq: Every day | ORAL | Status: DC
  Administered 2014-03-17: 180 mg via ORAL
  Filled 2014-03-17: qty 1

## 2014-03-17 NOTE — Evaluation (Signed)
Physical Therapy Evaluation Patient Details Name: Kayla Hale MRN: [id number] DOB: 1945/04/20 Today's Date: 03/17/2014   History of Present Illness   This a 38 yof admitted with gallstone pancreatitis that has resolved. Had laparoscopic cholecystectomy along with risks and benefits. Post op afib.  Clinical Impression  Pt admitted with above diagnosis. Pt currently with functional limitations due to the deficits listed below (see PT Problem List). Pt limited by incr HR.  Once HR under control, pt should be able to d/c home with initial use of RW at all times.  Pt did not want PT f/u.  This PT in agreement as long as pt uses RW initially until back to baseline.  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  Pt's sister hired someone to clean her house on Monday.   Pt will benefit from skilled PT to increase their independence and safety with mobility to allow discharge to the venue listed below.      Follow Up Recommendations No PT follow up    Equipment Recommendations  None recommended by PT    Recommendations for Other Services       Precautions / Restrictions Precautions Precautions: Fall Restrictions Weight Bearing Restrictions: No      Mobility  Bed Mobility Overal bed mobility: Independent                Transfers Overall transfer level: Independent                  Ambulation/Gait Ambulation/Gait assistance: Min guard Ambulation Distance (Feet): 115 Feet Assistive device: None Gait Pattern/deviations: Step-through pattern;Decreased stride length;Drifts right/left   Gait velocity interpretation: Below normal speed for age/gender General Gait Details: Pt slightly unsteady with gait but did not lose balance significantly.  HR in afib with incr to 156 bpm therefore had to terminate session early due to HR.  Recommended that pt use her RW initially upon d/c for safety and pt agrees.    Stairs            Wheelchair Mobility    Modified Rankin (Stroke Patients Only)       Balance Overall balance assessment: Needs assistance         Standing balance support: No upper extremity supported;During functional activity Standing balance-Leahy Scale: Fair Standing balance comment: can stand statically without device.                              Pertinent Vitals/Pain Pain Assessment: No/denies pain  HR 102-156 bpm, nursing aware.  BP 117/77 - 123/80.      Home Living Family/patient expects to be discharged to:: Private residence Living Arrangements: Spouse/significant other (spouse just finished 33 sessions of radiation) Available Help at Discharge: Family;Available PRN/intermittently (Daughters help, pt works 15-20 hours at [employer]) Type of Home: House Home Access: Stairs to enter Entrance Stairs-Rails: None Technical brewer of Steps: 2 Home Layout: One level Home Equipment: Environmental consultant - 2 wheels      Prior Function Level of Independence: Independent               Hand Dominance        Extremity/Trunk Assessment   Upper Extremity Assessment: Defer to OT evaluation           Lower Extremity Assessment: Generalized weakness      Cervical / Trunk Assessment: Normal  Communication  Communication: No difficulties  Cognition Arousal/Alertness: Awake/alert Behavior During Therapy: WFL for tasks assessed/performed Overall Cognitive Status: Within Functional Limits for tasks assessed                      General Comments      Exercises General Exercises - Lower Extremity Ankle Circles/Pumps: AROM;Both;5 reps;Seated Long Arc Quad: AROM;Both;5 reps;Seated      Assessment/Plan    PT Assessment Patient needs continued PT services  PT Diagnosis Generalized weakness   PT Problem List Decreased activity tolerance;Decreased balance;Decreased mobility;Decreased knowledge of use of DME;Decreased safety  awareness;Decreased knowledge of precautions  PT Treatment Interventions DME instruction;Gait training;Functional mobility training;Therapeutic activities;Therapeutic exercise;Balance training;Stair training;Patient/family education   PT Goals (Current goals can be found in the Care Plan section) Acute Rehab PT Goals Patient Stated Goal: to go home PT Goal Formulation: With patient Time For Goal Achievement: 03/24/14 Potential to Achieve Goals: Good    Frequency Min 3X/week   Barriers to discharge Decreased caregiver support (Wife drives and does cleaning/cooking)      Co-evaluation               End of Session Equipment Utilized During Treatment: Gait belt Activity Tolerance:  (limited by high HR) Patient left: in chair;with call bell/phone within reach Nurse Communication: Mobility status         Time: 8099-8338 PT Time Calculation (min) (ACUTE ONLY): 17 min   Charges:   PT Evaluation $Initial PT Evaluation Tier I: 1 Procedure PT Treatments $Gait Training: 8-22 mins   PT G CodesDenice Hale 2014-03-31, 1:05 PM Kayla Hale Kayla Hale,PT Acute Rehabilitation [phone] (pager)

## 2014-03-17 NOTE — Progress Notes (Addendum)
ANTICOAGULATION CONSULT NOTE - Follow Up Consult  Pharmacy Consult for Heparin>>Apixaban>>Heparin Indication: atrial fibrillation  Allergies  Allergen Reactions  . Lisinopril     COUGH  . Norvasc [Amlodipine Besylate]     LEG EDEMA     Patient Measurements: Height: 5' 6.38" (168.6 cm) Weight: 197 lb 11.2 oz (89.676 kg) IBW/kg (Calculated) : 60.17  Vital Signs: Temp: 97.5 F (36.4 C) (01/16 0343) Temp Source: Oral (01/16 0343) BP: 117/69 mmHg (01/16 0522)  Labs:  Recent Labs  03/14/14 1203  03/15/14 0556 03/16/14 0624 03/17/14 0510  HGB  --   < > 12.1 12.2 11.1*  HCT  --   --  36.0 35.9* 32.5*  PLT  --   --  143* 168 166  APTT 80*  --  73*  --   --   HEPARINUNFRC  --   --  0.33 0.16*  --   CREATININE  --   --  0.75 0.66  --   < > = values in this interval not displayed.  Estimated Creatinine Clearance: 76.5 mL/min (by C-G formula based on Cr of 0.66).  Assessment: 68yom on apixaban pta for afib. Admitted with gallstone pancreatitis and transitioned to IV heparin pending cholecystectomy 1/15. Heparin was ordered to resume this morning but now patient will just change back to her home apixaban. Dose 5mg  bid appropriate.  Goal of Therapy:  Monitor platelets by anticoagulation protocol: Yes   Plan:  1) Apixaban 5mg  bid as pta 2) Pharmacy will sign off  Deboraha Sprang 03/17/2014,8:14 AM  Addendum: Per Dr. Donne Hazel no oral anticoagulation until Monday so will switch back to IV heparin. Patient did not receive any doses of apixaban. Will resume heparin at 1000 units/hr (previously therapeutic on this rate) and check a 6 hour heparin level.  Deboraha Sprang 03/17/2014, 11:55 AM

## 2014-03-17 NOTE — Progress Notes (Signed)
Patient Demographics  Kayla Hale, is a 69 y.o. female, DOB - 03/17/1945, YSA:[id number]  Admit date - 03/13/2014   Admitting Physician Florencia Reasons, MD  Outpatient Primary MD for the patient is Wenda Low, MD  LOS - 4   Chief Complaint  Patient presents with  . Abdominal Pain  . Emesis        Subjective:   Kayla Hale today has, No headache, No chest pain, Much improved epigastic abdominal pain - No Nausea, No new weakness tingling or numbness, No Cough - SOB. In PACU doing well.  Assessment & Plan    1. Gallstone pancreatitis with likely passed CBD stone. Much improved, currently almost symptom-free, liver enzymes along with lipase trending down, does have gallstones, general surgery and GI following. Continue supportive care. Post laparoscopic cholecystectomy on 03/16/2014. Intraoperative cholangiolar and stable.   2. Chronic atrial fibrillation. In RVR on 03/16/2014, had to be placed on Cardizem drip, have switched her on oral Cardizem with as needed IV Cardizem push, monitor heart rate today, continue on heparin drip, will switch to Eliquis on Monday, okay to go home off of anticoagulation for a day per cardiologist Dr. Harl Bowie discussed on 03/17/2014. EF on recent echogram few months ago 55-60%.   3. Hypothyroidism. Home dose Synthroid continued.   4. GERD. IV PPI.   5. Dyslipidemia. Resumed statin postop.     Code Status: Full  Family Communication: Daughter bedside  Disposition Plan: Home   Procedures    CT Abd Pelvis   Consults  [facility], [facility] - [facility]   Medications  Scheduled Meds: . diltiazem  180 mg Oral Daily  . levothyroxine  75 mcg Oral QAC breakfast  . magnesium hydroxide  30 mL Oral BID  . oxybutynin  5 mg Oral BID  . pantoprazole (PROTONIX) IV  40 mg Intravenous  Daily  . polyethylene glycol  17 g Oral BID   Continuous Infusions:   PRN Meds:.morphine injection, ondansetron **OR** ondansetron (ZOFRAN) IV, oxyCODONE-acetaminophen  DVT Prophylaxis   Heparin  Gtt   Lab Results  Component Value Date   PLT 166 03/17/2014    Antibiotics     Anti-infectives    None          Objective:   Filed Vitals:   03/17/14 0343 03/17/14 0522 03/17/14 0746 03/17/14 0943  BP: 101/70 117/69 130/76 117/77  Pulse:   146   Temp: 97.5 F (36.4 C)     TempSrc: Oral     Resp: 11  16   Height:      Weight: 89.676 kg (197 lb 11.2 oz)     SpO2: 98%  98%     Wt Readings from Last 3 Encounters:  03/17/14 89.676 kg (197 lb 11.2 oz)  12/05/13 84.097 kg (185 lb 6.4 oz)     Intake/Output Summary (Last 24 hours) at 03/17/14 1150 Last data filed at 03/17/14 0749  Gross per 24 hour  Intake   1650 ml  Output   1450 ml  Net    200 ml     Physical Exam  Awake Alert, Oriented X 3, No new F.N deficits, Normal affect [place].AT,PERRAL Supple Neck,No JVD, No cervical lymphadenopathy appriciated.  Symmetrical Chest wall movement, Good air movement bilaterally, CTAB  RRR,No Gallops,Rubs or new Murmurs, No Parasternal Heave +ve B.Sounds, Abd Soft, No tenderness, No organomegaly appriciated, No rebound - guarding or rigidity. No Cyanosis, Clubbing or edema, No new Rash or bruise      Data Review   Micro Results  Recent Results (from the past 240 hour(s))  Surgical pcr screen     Status: None   Collection Time: 03/16/14  7:55 AM  Result Value Ref Range Status   MRSA, PCR NEGATIVE NEGATIVE Final   Staphylococcus aureus NEGATIVE NEGATIVE Final    Comment:        The Xpert SA Assay (FDA approved for NASAL specimens in patients over 57 years of age), is one component of a comprehensive surveillance program.  Test performance has been validated by [facility] for patients greater than or equal to 66 year old. It is not intended to diagnose infection nor  to guide or monitor treatment.     Radiology Reports Dg Chest 2 View  03/13/2014   CLINICAL DATA:  Chest pain and abdominal pain.  Vomiting.  EXAM: CHEST  2 VIEW  COMPARISON:  None.  FINDINGS: Heart size and pulmonary vascularity are normal and the lungs are clear. No acute osseous abnormality. No effusions.  IMPRESSION: No active cardiopulmonary disease.   Electronically Signed   By: Rozetta Nunnery M.D.   On: 03/13/2014 14:44   Dg Cholangiogram Operative  03/16/2014   CLINICAL DATA:  Cholelithiasis, status post laparoscopic cholecystectomy  EXAM: INTRAOPERATIVE CHOLANGIOGRAM  TECHNIQUE: Cholangiographic images from the C-arm fluoroscopic device were submitted for interpretation post-operatively. Please see the procedural report for the amount of contrast and the fluoroscopy time utilized.  COMPARISON:  03/13/2014  FINDINGS: Intraoperative cholangiogram performed during laparoscopic cholecystectomy. The intrahepatic ducts, biliary confluence, common hepatic duct, cystic duct, common bile duct, and refluxed pancreatic duct are all patent. Contrast eventually drains into the duodenum. No dilatation or obstruction.  IMPRESSION: Patent biliary system.   Electronically Signed   By: Daryll Brod M.D.   On: 03/16/2014 13:27   US Abdomen Complete  03/13/2014   CLINICAL DATA:  Abdominal pain and vomiting.  EXAM: ULTRASOUND ABDOMEN COMPLETE  COMPARISON:  None.  FINDINGS: Gallbladder: A single stone measuring 2.3 cm is seen in the fundus of the gallbladder and there is a small amount of gallbladder sludge. No wall thickening or pericholecystic fluid is identified. Sonographer reports negative Murphy's sign.  Common bile duct: Diameter: 0.4 cm  Liver: No focal lesion identified. Within normal limits in parenchymal echogenicity.  IVC: Not visualized.  Pancreas: Not visualized.  Spleen: Size and appearance within normal limits.  Right Kidney: Length: 9.3 cm. Echogenicity within normal limits. No mass or hydronephrosis  visualized.  Left Kidney: Length: 9.5 cm. Echogenicity within normal limits. No mass or hydronephrosis visualized.  Abdominal aorta: No aneurysm visualized.  Other findings: None.  IMPRESSION: Single 2.3 cm stone in the fundus of the gallbladder and a small amount of gallbladder sludge without evidence of cholecystitis.  The inferior vena cava and pancreas are not visualized.   Electronically Signed   By: Inge Rise M.D.   On: 03/13/2014 15:35     CBC  Recent Labs Lab 03/13/14 1315 03/14/14 0430 03/15/14 0556 03/16/14 0624 03/17/14 0510  WBC 9.3 8.7 8.8 8.9 7.6  HGB 15.2* 12.7 12.1 12.2 11.1*  HCT 42.8 37.3 36.0 35.9* 32.5*  PLT 189 157 143* 168 166  MCV 91.1 92.8 95.2 92.8 93.7  MCH 32.3 31.6 32.0 31.5 32.0  MCHC 35.5  34.0 33.6 34.0 34.2  RDW 12.9 13.4 13.2 13.2 13.1  LYMPHSABS 0.6* 1.2  --   --   --   MONOABS 0.4 0.6  --   --   --   EOSABS 0.0 0.1  --   --   --   BASOSABS 0.0 0.0  --   --   --     Chemistries   Recent Labs Lab 03/13/14 1315 03/14/14 0430 03/15/14 0556 03/16/14 0624 03/17/14 0830  NA 140 136 142 137 136  K 3.9 3.3* 3.4* 4.0 4.6  CL 104 103 107 105 105  CO2 24 24 22 24 27   GLUCOSE 145* 91 72 101* 124*  BUN 17 15 13 9 10   CREATININE 0.88 0.79 0.75 0.66 0.66  CALCIUM 9.7 8.1* 8.6 8.6 8.8  AST 211* 103* 42* 28 36  ALT 241* 155* 94* 70* 66*  ALKPHOS 181* 138* 103 94 92  BILITOT 4.3* 1.2 1.1 1.4* 0.8   ------------------------------------------------------------------------------------------------------------------ estimated creatinine clearance is 76.5 mL/min (by C-G formula based on Cr of 0.66). ------------------------------------------------------------------------------------------------------------------ No results for input(s): HGBA1C in the last 72 hours. ------------------------------------------------------------------------------------------------------------------ No results for input(s): CHOL, HDL, LDLCALC, TRIG, CHOLHDL, LDLDIRECT  in the last 72 hours. ------------------------------------------------------------------------------------------------------------------ No results for input(s): TSH, T4TOTAL, T3FREE, THYROIDAB in the last 72 hours.  Invalid input(s): FREET3 ------------------------------------------------------------------------------------------------------------------ No results for input(s): VITAMINB12, FOLATE, FERRITIN, TIBC, IRON, RETICCTPCT in the last 72 hours.  Coagulation profile  Recent Labs Lab 03/13/14 2218  INR 1.26    No results for input(s): DDIMER in the last 72 hours.  Cardiac Enzymes No results for input(s): CKMB, TROPONINI, MYOGLOBIN in the last 168 hours.  Invalid input(s): CK ------------------------------------------------------------------------------------------------------------------ Invalid input(s): POCBNP     Time Spent in minutes   35   Carnella Fryman K M.D on 03/17/2014 at 11:50 AM  Between 7am to 7pm - Pager - [phone]  After 7pm go to [email or web address] - [facility] Office  [phone]

## 2014-03-17 NOTE — Progress Notes (Signed)
Patient ID: Kayla Hale, female   DOB: 21-Aug-1945, 69 y.o.   MRN: [id number] 1 Day Post-Op  Subjective: Pt feels well this morning.  Tolerating a clear liquid diet  Objective: Vital signs in last 24 hours: Temp:  [97.5 F (36.4 C)-98.2 F (36.8 C)] 97.5 F (36.4 C) (01/16 0343) Pulse Rate:  [64-151] 146 (01/16 0746) Resp:  [10-29] 16 (01/16 0746) BP: (89-138)/(55-94) 130/76 mmHg (01/16 0746) SpO2:  [92 %-99 %] 98 % (01/16 0746) Weight:  [197 lb 11.2 oz (89.676 kg)] 197 lb 11.2 oz (89.676 kg) (01/16 0343) Last BM Date: 03/11/14  Intake/Output from previous day: 01/15 0701 - 01/16 0700 In: 2950 [P.O.:720; I.V.:2230] Out: 1140 [Urine:1100; Blood:40] Intake/Output this shift: Total I/O In: -  Out: 350 [Urine:350]  PE: Abd: soft, appropriately tender, +BS, incisions c/d/i with dermabond in place Heart: tachy in 130s-150s, irregular Lungs: CTAB  Lab Results:   Recent Labs  03/16/14 0624 03/17/14 0510  WBC 8.9 7.6  HGB 12.2 11.1*  HCT 35.9* 32.5*  PLT 168 166   BMET  Recent Labs  03/15/14 0556 03/16/14 0624  NA 142 137  K 3.4* 4.0  CL 107 105  CO2 22 24  GLUCOSE 72 101*  BUN 13 9  CREATININE 0.75 0.66  CALCIUM 8.6 8.6   PT/INR No results for input(s): LABPROT, INR in the last 72 hours. CMP     Component Value Date/Time   NA 137 03/16/2014 0624   K 4.0 03/16/2014 0624   CL 105 03/16/2014 0624   CO2 24 03/16/2014 0624   GLUCOSE 101* 03/16/2014 0624   BUN 9 03/16/2014 0624   CREATININE 0.66 03/16/2014 0624   CALCIUM 8.6 03/16/2014 0624   PROT 6.3 03/16/2014 0624   ALBUMIN 2.9* 03/16/2014 0624   AST 28 03/16/2014 0624   ALT 70* 03/16/2014 0624   ALKPHOS 94 03/16/2014 0624   BILITOT 1.4* 03/16/2014 0624   GFRNONAA 89* 03/16/2014 0624   GFRAA >90 03/16/2014 0624   Lipase     Component Value Date/Time   LIPASE 72* 03/15/2014 0556       Studies/Results: Dg Cholangiogram Operative  03/16/2014   CLINICAL DATA:  Cholelithiasis, status post  laparoscopic cholecystectomy  EXAM: INTRAOPERATIVE CHOLANGIOGRAM  TECHNIQUE: Cholangiographic images from the C-arm fluoroscopic device were submitted for interpretation post-operatively. Please see the procedural report for the amount of contrast and the fluoroscopy time utilized.  COMPARISON:  03/13/2014  FINDINGS: Intraoperative cholangiogram performed during laparoscopic cholecystectomy. The intrahepatic ducts, biliary confluence, common hepatic duct, cystic duct, common bile duct, and refluxed pancreatic duct are all patent. Contrast eventually drains into the duodenum. No dilatation or obstruction.  IMPRESSION: Patent biliary system.   Electronically Signed   By: Daryll Brod M.D.   On: 03/16/2014 13:27    Anti-infectives: Anti-infectives    None       Assessment/Plan  1. POD 1, s/p lap chole after gallstone pancreatitis 2. A fib with RVR  Plan: 1. Advance diet to heart healthy.  She surgically is stable.  Patient would like to go home.  Pharmacy started the patient on her Eliquis today, but I have stopped this as operating surgeon, Dr. Donne Hazel, does not want this restarted until early this upcoming week, such as Monday.  She is tachy in the 130-150s and irregular.  I have spoken to Dr. Candiss Norse.  He will d/w her cardiologist to make further decisions about her HR, blood thinners, and timing of discharge.   LOS: 4 days  Norrine Ballester E 03/17/2014, 9:19 AM Pager: [phone]

## 2014-03-17 NOTE — Progress Notes (Signed)
ANTICOAGULATION CONSULT NOTE - Follow Up Consult  Pharmacy Consult for Heparin>>Apixaban>>Heparin Indication: atrial fibrillation  Allergies  Allergen Reactions  . Lisinopril     COUGH  . Norvasc [Amlodipine Besylate]     LEG EDEMA     Patient Measurements: Height: 5' 6.38" (168.6 cm) Weight: 197 lb 11.2 oz (89.676 kg) IBW/kg (Calculated) : 60.17  Vital Signs: BP: 133/84 mmHg (01/16 1832) Pulse Rate: 146 (01/16 0746)  Labs:  Recent Labs  03/15/14 0556 03/16/14 0624 03/17/14 0510 03/17/14 0830 03/17/14 1827  HGB 12.1 12.2 11.1*  --   --   HCT 36.0 35.9* 32.5*  --   --   PLT 143* 168 166  --   --   APTT 73*  --   --   --   --   HEPARINUNFRC 0.33 0.16*  --   --  0.32  CREATININE 0.75 0.66  --  0.66  --     Estimated Creatinine Clearance: 76.5 mL/min (by C-G formula based on Cr of 0.66).  Assessment: 68yom on apixaban pta for afib. Admitted with gallstone pancreatitis and transitioned to IV heparin pending cholecystectomy 1/15, to defer starting oral anticoagulation till 1/18.  Heparin therapeutic at 1000 units/hr, no bleeding noted.   Goal of Therapy:  Monitor platelets by anticoagulation protocol: Yes  Heparin level 0.3-0.7   Plan:  Continue heparin at 1000 units/hr Daily heparin level CBC   Thank you for allowing pharmacy to be a part of this patients care team.  Rowe Robert Pharm.D., BCPS, AQ-Cardiology Clinical Pharmacist 03/17/2014 7:11 PM Pager: [phone] Phone: [phone]

## 2014-03-18 DIAGNOSIS — I482 Chronic atrial fibrillation: Secondary | ICD-10-CM

## 2014-03-18 DIAGNOSIS — R9431 Abnormal electrocardiogram [ECG] [EKG]: Secondary | ICD-10-CM

## 2014-03-18 LAB — CBC
HCT: 32 % — ABNORMAL LOW (ref 36.0–46.0)
Hemoglobin: 10.8 g/dL — ABNORMAL LOW (ref 12.0–15.0)
MCH: 31.1 pg (ref 26.0–34.0)
MCHC: 33.8 g/dL (ref 30.0–36.0)
MCV: 92.2 fL (ref 78.0–100.0)
Platelets: 192 10*3/uL (ref 150–400)
RBC: 3.47 MIL/uL — ABNORMAL LOW (ref 3.87–5.11)
RDW: 13.2 % (ref 11.5–15.5)
WBC: 7.9 10*3/uL (ref 4.0–10.5)

## 2014-03-18 LAB — TSH: TSH: 1.875 u[IU]/mL (ref 0.350–4.500)

## 2014-03-18 LAB — HEPARIN LEVEL (UNFRACTIONATED): Heparin Unfractionated: 0.38 IU/mL (ref 0.30–0.70)

## 2014-03-18 MED ORDER — GLUCAGON HCL RDNA (DIAGNOSTIC) 1 MG IJ SOLR: 1.0000 mg | INTRAMUSCULAR | Status: DC | PRN

## 2014-03-18 MED ORDER — METOPROLOL TARTRATE 50 MG PO TABS
50.0000 mg | ORAL_TABLET | Freq: Two times a day (BID) | ORAL | Status: DC
  Administered 2014-03-18: 50 mg via ORAL
  Filled 2014-03-18: qty 1

## 2014-03-18 MED ORDER — BISOPROLOL-HYDROCHLOROTHIAZIDE 10-6.25 MG PO TABS
1.0000 | ORAL_TABLET | Freq: Every day | ORAL | Status: DC
  Administered 2014-03-19: 1 via ORAL
  Filled 2014-03-18 (×2): qty 1

## 2014-03-18 MED ORDER — DILTIAZEM HCL ER COATED BEADS 240 MG PO CP24: 240.0000 mg | ORAL_CAPSULE | Freq: Every day | ORAL | Status: DC

## 2014-03-18 MED ORDER — DILTIAZEM HCL 25 MG/5ML IV SOLN
10.0000 mg | Freq: Four times a day (QID) | INTRAVENOUS | Status: DC | PRN
  Filled 2014-03-18: qty 5

## 2014-03-18 MED ORDER — PANTOPRAZOLE SODIUM 40 MG PO TBEC
40.0000 mg | DELAYED_RELEASE_TABLET | Freq: Every day | ORAL | Status: DC
  Administered 2014-03-18 – 2014-03-19 (×2): 40 mg via ORAL
  Filled 2014-03-18 (×2): qty 1

## 2014-03-18 MED ORDER — BISACODYL 10 MG RE SUPP
10.0000 mg | Freq: Once | RECTAL | Status: AC
  Administered 2014-03-18: 10 mg via RECTAL
  Filled 2014-03-18: qty 1

## 2014-03-18 NOTE — Discharge Summary (Signed)
BARNEY GERTSCH, is a 69 y.o. female  DOB 1945/10/15  MRN [id number].  Admission date:  03/13/2014  Admitting Physician  Florencia Reasons, MD  Discharge Date:  03/19/2014   Primary MD  Wenda Low, MD  Recommendations for primary care physician for things to follow:    Monitor CBC, CMP, H.Rate   Admission Diagnosis  Gallstone pancreatitis [K85.9, K80.20] Abdominal pain [R10.9] Chest pain [R07.9]   Discharge Diagnosis  Gallstone pancreatitis [K85.9, K80.20] Abdominal pain [R10.9] Chest pain [R07.9]    Principal Problem:   Pancreatitis due to biliary obstruction Active Problems:   Chronic atrial fibrillation- Chadss-vasc score of 3 for female, age 14, and hypertension   HTN (hypertension)   Chronic anticoagulation   Dyslipidemia   Hypothyroid-TSH WNL      Past Medical History  Diagnosis Date  . Hypertension   . Thyroid disease   . Hyperlipidemia   . Dyslipidemia   . Mild obesity   . Urinary incontinence   . Seasonal allergies   . Shingles 2005    LOWER BACK  . Diverticulosis 2004  . Mild anemia   . Chronic kidney disease     STAGE 2  . Atrial fibrillation   . Hypothyroidism   . GERD (gastroesophageal reflux disease)   . Gallstones     Past Surgical History  Procedure Laterality Date  . Colonoscopy    . Tubal ligation         History of present illness and  Hospital Course:     Kindly see H&P for history of present illness and admission details, please review complete Labs, Consult reports and Test reports for all details in brief  HPI  from the history and physical done on the day of admission  SHEILYN BOEHLKE is a 69 y.o. female with h/o chronic atrial fibrillation who presents to the emergency department complaining of nausea, vomiting and abdominal pain since Sunday. The patient reports she ate a [employer]  hamburger on Sunday and started having lots of vomiting and nausea. She reports feeling better yesterday however her abdominal pain and nausea returned today. The patient claims epigastric and right upper quadrant abdominal pain that she rates at an 8 out of 10. Patient reports vomiting possibly 30 times today. Patient denies diarrhea. Patient's last bowel movement was on Sunday and was normal. Patient reports chills without fever. The patient was seen by her primary care provider and given 50 mg of Phenergan at his office, with mild relief of her nausea. Patient denies previous abdominal surgeries. The patient denies fevers, hematemesis, dysuria, hematuria, hematochezia, sick contacts, diarrhea, chest pain or shortness of breath. Initial lab works in ED revealed elevated lft and lipase. Ab [place] Single 2.3 cm stone in the fundus of the gallbladder and a small amount of gallbladder sludge without evidence of cholecystitis. ED consulted General surgery and GI. hospitalist service contacted for admission.   Hospital Course    1. Gallstone pancreatitis with likely passed CBD stone. Much improved, currently almost symptom-free, liver enzymes along with  lipase trending down, does have gallstones, general surgery and GI following. Continue supportive care. Post laparoscopic cholecystectomy on 03/16/2014. Intraoperative cholangiolar and stable.   2. Chronic atrial fibrillation. In RVR on 03/16/2014, had to be placed on Cardizem drip, was seen by cardiology and switch back to bisoprolol, cleared by cardiology for discharge, at rest heart rate acceptable, does get tachycardic on ablation, evaluated by cardiology this morning, no further addition in medications, Eliquis to resume from today. EF on recent echogram few months ago 55-60%. Follow with her cardiologist post discharge Dr. Karlene Lineman.   3. Hypothyroidism. Home dose Synthroid continued.   4. GERD. IV PPI.   5. Dyslipidemia. Resumed statin postop.       Discharge Condition: Stable   Follow UP  Follow-up Information    Follow up with [facility] On 04/10/2014.   Why:  Doc of the Week clinic, 2:15pm, arrive no later than 1:45pm for paperwork   Contact information:   [place] [address] [phone]      Follow up with Wenda Low, MD. Schedule an appointment as soon as possible for a visit in 3 days.   Specialty:  Internal Medicine   Why:  and your Heart Doctor   Contact information:   [address], [place] [address] [place] [address] [phone]       Follow up with Nahser, Wonda Cheng, MD.   Specialty:  Cardiology   Why:  office will contact you   Contact information:   [address] [place] [address] [phone]         Discharge Instructions  and  Discharge Medications          Discharge Instructions    Diet - low sodium heart healthy    Complete by:  As directed      Discharge instructions    Complete by:  As directed   [place]  Follow with Primary MD Wenda Low, MD and your Heart doctor in 3 days   Get CBC, CMP, 2 view Chest X ray checked  by Primary MD next visit.    Activity: As tolerated with Full fall precautions use walker/cane & assistance as needed   Disposition Home     Diet: Heart Healthy    For Heart failure patients - Check your Weight same time everyday, if you gain over 2 pounds, or you develop in leg swelling, experience more shortness of breath or chest pain, call your Primary MD immediately. Follow Cardiac Low Salt Diet and 1.8 lit/day fluid restriction.   On your next visit with your primary care physician please Get Medicines reviewed and adjusted.   Please request your Prim.MD to go over all Hospital Tests and Procedure/Radiological results at the follow up, please get all Hospital records sent to your Prim MD by signing hospital release before you go home.   If you  experience worsening of your admission symptoms, develop shortness of breath, life threatening emergency, suicidal or homicidal thoughts you must seek medical attention immediately by calling 911 or calling your MD immediately  if symptoms less severe.  You Must read complete instructions/literature along with all the possible adverse reactions/side effects for all the Medicines you take and that have been prescribed to you. Take any new Medicines after you have completely understood and accpet all the possible adverse reactions/side effects.   Do not drive, operating heavy machinery, perform activities at heights, swimming or participation in water activities or  provide baby sitting services if your were admitted for syncope or siezures until you have seen by Primary MD or a Neurologist and advised to do so again.  Do not drive when taking Pain medications.    Do not take more than prescribed Pain, Sleep and Anxiety Medications  Special Instructions: If you have smoked or chewed Tobacco  in the last 2 yrs please stop smoking, stop any regular Alcohol  and or any Recreational drug use.  Wear Seat belts while driving.   Please note  You were cared for by a hospitalist during your hospital stay. If you have any questions about your discharge medications or the care you received while you were in the hospital after you are discharged, you can call the unit and asked to speak with the hospitalist on call if the hospitalist that took care of you is not available. Once you are discharged, your primary care physician will handle any further medical issues. Please note that NO REFILLS for any discharge medications will be authorized once you are discharged, as it is imperative that you return to your primary care physician (or establish a relationship with a primary care physician if you do not have one) for your aftercare needs so that they can reassess your need for medications and monitor your lab  values.     Discharge patient    Complete by:  As directed      Increase activity slowly    Complete by:  As directed             Medication List    TAKE these medications        ALPRAZolam 0.5 MG tablet  Commonly known as:  XANAX  Take 0.5 mg by mouth See admin instructions. Takes 1 tablet once a week as needed     apixaban 5 MG Tabs tablet  Commonly known as:  ELIQUIS  Take 1 tablet (5 mg total) by mouth 2 (two) times daily.     bisoprolol-hydrochlorothiazide 10-6.25 MG per tablet  Commonly known as:  ZIAC  Take 1 tablet by mouth daily.     calcium-vitamin D 500-200 MG-UNIT per tablet  Take 1 tablet by mouth daily.     cholecalciferol 1000 UNITS tablet  Commonly known as:  VITAMIN D  Take 1,000 Units by mouth daily.     levothyroxine 75 MCG tablet  Commonly known as:  SYNTHROID, LEVOTHROID  Take 75 mcg by mouth daily before breakfast.     Melatonin 200 MCG Tabs  Take 200 mcg by mouth at bedtime as needed.     multivitamin tablet  Take 1 tablet by mouth daily.     omeprazole 20 MG capsule  Commonly known as:  PRILOSEC  Take 20 mg by mouth daily.     oxybutynin 5 MG tablet  Commonly known as:  DITROPAN  Take 5 mg by mouth See admin instructions. States she takes 2 to 3 tablets daily     oxyCODONE-acetaminophen 5-325 MG per tablet  Commonly known as:  PERCOCET/ROXICET  Take 1 tablet by mouth every 4 (four) hours as needed for moderate pain.     polyethylene glycol packet  Commonly known as:  MIRALAX / GLYCOLAX  Take 17 g by mouth 2 (two) times daily.     pravastatin 20 MG tablet  Commonly known as:  PRAVACHOL  Take 20 mg by mouth daily.          Diet and Activity recommendation: See Discharge Instructions above  Consults obtained - [facility]   Major procedures and Radiology Reports - PLEASE review detailed and final reports for all details, in brief -      Laparoscopic cholecystectomy on 03/16/2014   Dg Chest 2 View  03/13/2014   CLINICAL  DATA:  Chest pain and abdominal pain.  Vomiting.  EXAM: CHEST  2 VIEW  COMPARISON:  None.  FINDINGS: Heart size and pulmonary vascularity are normal and the lungs are clear. No acute osseous abnormality. No effusions.  IMPRESSION: No active cardiopulmonary disease.   Electronically Signed   By: Rozetta Nunnery M.D.   On: 03/13/2014 14:44   Dg Cholangiogram Operative  03/16/2014   CLINICAL DATA:  Cholelithiasis, status post laparoscopic cholecystectomy  EXAM: INTRAOPERATIVE CHOLANGIOGRAM  TECHNIQUE: Cholangiographic images from the C-arm fluoroscopic device were submitted for interpretation post-operatively. Please see the procedural report for the amount of contrast and the fluoroscopy time utilized.  COMPARISON:  03/13/2014  FINDINGS: Intraoperative cholangiogram performed during laparoscopic cholecystectomy. The intrahepatic ducts, biliary confluence, common hepatic duct, cystic duct, common bile duct, and refluxed pancreatic duct are all patent. Contrast eventually drains into the duodenum. No dilatation or obstruction.  IMPRESSION: Patent biliary system.   Electronically Signed   By: Daryll Brod M.D.   On: 03/16/2014 13:27   US Abdomen Complete  03/13/2014   CLINICAL DATA:  Abdominal pain and vomiting.  EXAM: ULTRASOUND ABDOMEN COMPLETE  COMPARISON:  None.  FINDINGS: Gallbladder: A single stone measuring 2.3 cm is seen in the fundus of the gallbladder and there is a small amount of gallbladder sludge. No wall thickening or pericholecystic fluid is identified. Sonographer reports negative Murphy's sign.  Common bile duct: Diameter: 0.4 cm  Liver: No focal lesion identified. Within normal limits in parenchymal echogenicity.  IVC: Not visualized.  Pancreas: Not visualized.  Spleen: Size and appearance within normal limits.  Right Kidney: Length: 9.3 cm. Echogenicity within normal limits. No mass or hydronephrosis visualized.  Left Kidney: Length: 9.5 cm. Echogenicity within normal limits. No mass or  hydronephrosis visualized.  Abdominal aorta: No aneurysm visualized.  Other findings: None.  IMPRESSION: Single 2.3 cm stone in the fundus of the gallbladder and a small amount of gallbladder sludge without evidence of cholecystitis.  The inferior vena cava and pancreas are not visualized.   Electronically Signed   By: Inge Rise M.D.   On: 03/13/2014 15:35    Micro Results      Recent Results (from the past 240 hour(s))  Surgical pcr screen     Status: None   Collection Time: 03/16/14  7:55 AM  Result Value Ref Range Status   MRSA, PCR NEGATIVE NEGATIVE Final   Staphylococcus aureus NEGATIVE NEGATIVE Final    Comment:        The Xpert SA Assay (FDA approved for NASAL specimens in patients over 77 years of age), is one component of a comprehensive surveillance program.  Test performance has been validated by [facility] for patients greater than or equal to 60 year old. It is not intended to diagnose infection nor to guide or monitor treatment.        Today   Subjective:   Elecia Serafin today has no headache,no chest abdominal pain,no new weakness tingling or numbness, feels much better wants to go home today.    Objective:   Blood pressure 127/68, pulse 82, temperature 98.6 F (37 C), temperature source Oral, resp. rate 18, height 5' 6.38" (1.686 m), weight 89.449 kg (197 lb 3.2 oz), SpO2  98 %.   Intake/Output Summary (Last 24 hours) at 03/19/14 0920 Last data filed at 03/19/14 0829  Gross per 24 hour  Intake  835.5 ml  Output    876 ml  Net  -40.5 ml    Exam Awake Alert, Oriented x 3, No new F.N deficits, Normal affect [place].AT,PERRAL Supple Neck,No JVD, No cervical lymphadenopathy appriciated.  Symmetrical Chest wall movement, Good air movement bilaterally, CTAB Irregular RRR,No Gallops,Rubs or new Murmurs, No Parasternal Heave +ve B.Sounds, Abd Soft, Non tender, No organomegaly appriciated, No rebound -guarding or rigidity. No Cyanosis, Clubbing or edema,  No new Rash or bruise  Data Review   CBC w Diff:  Lab Results  Component Value Date   WBC 8.2 03/19/2014   HGB 10.4* 03/19/2014   HCT 31.2* 03/19/2014   PLT 181 03/19/2014   LYMPHOPCT 14 03/14/2014   MONOPCT 6 03/14/2014   EOSPCT 1 03/14/2014   BASOPCT 0 03/14/2014    CMP:  Lab Results  Component Value Date   NA 136 03/17/2014   K 4.6 03/17/2014   CL 105 03/17/2014   CO2 27 03/17/2014   BUN 10 03/17/2014   CREATININE 0.66 03/17/2014   PROT 6.3 03/17/2014   ALBUMIN 2.9* 03/17/2014   BILITOT 0.8 03/17/2014   ALKPHOS 92 03/17/2014   AST 36 03/17/2014   ALT 66* 03/17/2014  .   Total Time in preparing paper work, data evaluation and todays exam - 35 minutes  Thurnell Lose M.D on 03/19/2014 at 9:20 AM  [facility] Office  [phone]

## 2014-03-18 NOTE — Progress Notes (Signed)
Patient ID: Kayla Hale, female   DOB: Jul 19, 1945, 69 y.o.   MRN: [id number] 2 Days Post-Op  Subjective: Pt feels great today.  Eating well.  Hasn't had a BM yet, but passing flatus  Objective: Vital signs in last 24 hours: Temp:  [97.7 F (36.5 C)-98.2 F (36.8 C)] 97.7 F (36.5 C) (01/17 0741) Pulse Rate:  [51-90] 90 (01/17 0914) Resp:  [12-22] 18 (01/17 0400) BP: (115-136)/(70-84) 123/76 mmHg (01/17 0741) SpO2:  [95 %-97 %] 97 % (01/17 0741) Last BM Date: 03/11/14  Intake/Output from previous day: 01/16 0701 - 01/17 0700 In: 1010 [P.O.:1000; I.V.:10] Out: 2350 [Urine:2350] Intake/Output this shift: Total I/O In: 240 [P.O.:240] Out: 650 [Urine:650]  PE: Abd: soft, minimally tender, incisions c/d/i, +BS  Lab Results:   Recent Labs  03/17/14 0510 03/18/14 0447  WBC 7.6 7.9  HGB 11.1* 10.8*  HCT 32.5* 32.0*  PLT 166 192   BMET  Recent Labs  03/16/14 0624 03/17/14 0830  NA 137 136  K 4.0 4.6  CL 105 105  CO2 24 27  GLUCOSE 101* 124*  BUN 9 10  CREATININE 0.66 0.66  CALCIUM 8.6 8.8   PT/INR No results for input(s): LABPROT, INR in the last 72 hours. CMP     Component Value Date/Time   NA 136 03/17/2014 0830   K 4.6 03/17/2014 0830   CL 105 03/17/2014 0830   CO2 27 03/17/2014 0830   GLUCOSE 124* 03/17/2014 0830   BUN 10 03/17/2014 0830   CREATININE 0.66 03/17/2014 0830   CALCIUM 8.8 03/17/2014 0830   PROT 6.3 03/17/2014 0830   ALBUMIN 2.9* 03/17/2014 0830   AST 36 03/17/2014 0830   ALT 66* 03/17/2014 0830   ALKPHOS 92 03/17/2014 0830   BILITOT 0.8 03/17/2014 0830   GFRNONAA 89* 03/17/2014 0830   GFRAA >90 03/17/2014 0830   Lipase     Component Value Date/Time   LIPASE 72* 03/15/2014 0556       Studies/Results: No results found.  Anti-infectives: Anti-infectives    None       Assessment/Plan  1. POD 2, s/p lap chole 2. A fib, rate controlled  Plan: 1. Patient ok for dc home from surgery standpoint.  Resume eliquis  tomorrow. 2. Patient would like a suppository prior to discharge.  I have encouraged her to continue her miralax at home as needed. 3. Her follow up with [place] has been arranged.   LOS: 5 days    Andrius Andrepont E 03/18/2014, 9:54 AM Pager: [phone]

## 2014-03-18 NOTE — Progress Notes (Signed)
Patient Demographics  Kayla Hale, is a 69 y.o. female, DOB - 11/27/1945, TDD:[id number]  Admit date - 03/13/2014   Admitting Physician Florencia Reasons, MD  Outpatient Primary MD for the patient is Wenda Low, MD  LOS - 5   Chief Complaint  Patient presents with  . Abdominal Pain  . Emesis        Subjective:   Kayla Hale today has, No headache, No chest pain, Much improved epigastic abdominal pain - No Nausea, No new weakness tingling or numbness, No Cough - SOB. In PACU doing well.  Assessment & Plan    1. Gallstone pancreatitis with likely passed CBD stone. Much improved, currently almost symptom-free, liver enzymes along with lipase trending down, does have gallstones, general surgery and GI following. Continue supportive care. Post laparoscopic cholecystectomy on 03/16/2014. Intraoperative cholangiolar and stable.   2. Chronic atrial fibrillation. In RVR on 03/16/2014, had to be placed on Cardizem drip, have switched her on oral Cardizem with as needed IV Cardizem push, she still gets tachycardic on amputation and Lopressor has been added, monitor heart rate today, continue on heparin drip, will switch to Eliquis on Monday, okay to go home off of anticoagulation for a day per cardiologist Dr. Harl Bowie discussed on 03/17/2014. EF on recent echogram few months ago 55-60%. If stable may discharge later today. If heart rate continues to be an issue we'll formally consult cardiology. Had discussed plan on 03/18/2014 with Dr. Mare Ferrari cardiologist on call again.   3. Hypothyroidism. Home dose Synthroid continued.   4. GERD. IV PPI.   5. Dyslipidemia. Resumed statin postop.     Code Status: Full  Family Communication: Daughter bedside  Disposition Plan: Home   Procedures    CT Abd  Pelvis   Consults  [facility], [facility] - [facility]   Medications  Scheduled Meds: . bisacodyl  10 mg Rectal Once  . diltiazem  240 mg Oral Daily  . levothyroxine  75 mcg Oral QAC breakfast  . metoprolol tartrate  50 mg Oral BID  . oxybutynin  5 mg Oral BID  . pantoprazole  40 mg Oral Daily  . polyethylene glycol  17 g Oral BID  . pravastatin  20 mg Oral q1800   Continuous Infusions: . heparin 1,000 Units/hr (03/17/14 1237)   PRN Meds:.ALPRAZolam, diltiazem, morphine injection, ondansetron **OR** ondansetron (ZOFRAN) IV, oxyCODONE-acetaminophen  DVT Prophylaxis   Heparin  Gtt   Lab Results  Component Value Date   PLT 192 03/18/2014    Antibiotics     Anti-infectives    None          Objective:   Filed Vitals:   03/18/14 0013 03/18/14 0400 03/18/14 0741 03/18/14 0914  BP: 126/71 115/70 123/76   Pulse: 89 79 74 90  Temp: 97.8 F (36.6 C) 98.2 F (36.8 C) 97.7 F (36.5 C)   TempSrc: Oral Oral Oral   Resp: 12 18    Height:      Weight:      SpO2: 96% 95% 97%     Wt Readings from Last 3 Encounters:  03/17/14 89.676 kg (197 lb 11.2 oz)  12/05/13 84.097 kg (185 lb 6.4 oz)     Intake/Output Summary (Last 24 hours) at 03/18/14 1145 Last data  filed at 03/18/14 0943  Gross per 24 hour  Intake   1250 ml  Output   2650 ml  Net  -1400 ml     Physical Exam  Awake Alert, Oriented X 3, No new F.N deficits, Normal affect [place].AT,PERRAL Supple Neck,No JVD, No cervical lymphadenopathy appriciated.  Symmetrical Chest wall movement, Good air movement bilaterally, CTAB RRR,No Gallops,Rubs or new Murmurs, No Parasternal Heave +ve B.Sounds, Abd Soft, No tenderness, No organomegaly appriciated, No rebound - guarding or rigidity. No Cyanosis, Clubbing or edema, No new Rash or bruise      Data Review   Micro Results  Recent Results (from the past 240 hour(s))  Surgical pcr screen     Status: None   Collection Time: 03/16/14  7:55 AM  Result Value Ref Range Status   MRSA,  PCR NEGATIVE NEGATIVE Final   Staphylococcus aureus NEGATIVE NEGATIVE Final    Comment:        The Xpert SA Assay (FDA approved for NASAL specimens in patients over 17 years of age), is one component of a comprehensive surveillance program.  Test performance has been validated by [facility] for patients greater than or equal to 40 year old. It is not intended to diagnose infection nor to guide or monitor treatment.     Radiology Reports Dg Chest 2 View  03/13/2014   CLINICAL DATA:  Chest pain and abdominal pain.  Vomiting.  EXAM: CHEST  2 VIEW  COMPARISON:  None.  FINDINGS: Heart size and pulmonary vascularity are normal and the lungs are clear. No acute osseous abnormality. No effusions.  IMPRESSION: No active cardiopulmonary disease.   Electronically Signed   By: Rozetta Nunnery M.D.   On: 03/13/2014 14:44   Dg Cholangiogram Operative  03/16/2014   CLINICAL DATA:  Cholelithiasis, status post laparoscopic cholecystectomy  EXAM: INTRAOPERATIVE CHOLANGIOGRAM  TECHNIQUE: Cholangiographic images from the C-arm fluoroscopic device were submitted for interpretation post-operatively. Please see the procedural report for the amount of contrast and the fluoroscopy time utilized.  COMPARISON:  03/13/2014  FINDINGS: Intraoperative cholangiogram performed during laparoscopic cholecystectomy. The intrahepatic ducts, biliary confluence, common hepatic duct, cystic duct, common bile duct, and refluxed pancreatic duct are all patent. Contrast eventually drains into the duodenum. No dilatation or obstruction.  IMPRESSION: Patent biliary system.   Electronically Signed   By: Daryll Brod M.D.   On: 03/16/2014 13:27   US Abdomen Complete  03/13/2014   CLINICAL DATA:  Abdominal pain and vomiting.  EXAM: ULTRASOUND ABDOMEN COMPLETE  COMPARISON:  None.  FINDINGS: Gallbladder: A single stone measuring 2.3 cm is seen in the fundus of the gallbladder and there is a small amount of gallbladder sludge. No wall  thickening or pericholecystic fluid is identified. Sonographer reports negative Murphy's sign.  Common bile duct: Diameter: 0.4 cm  Liver: No focal lesion identified. Within normal limits in parenchymal echogenicity.  IVC: Not visualized.  Pancreas: Not visualized.  Spleen: Size and appearance within normal limits.  Right Kidney: Length: 9.3 cm. Echogenicity within normal limits. No mass or hydronephrosis visualized.  Left Kidney: Length: 9.5 cm. Echogenicity within normal limits. No mass or hydronephrosis visualized.  Abdominal aorta: No aneurysm visualized.  Other findings: None.  IMPRESSION: Single 2.3 cm stone in the fundus of the gallbladder and a small amount of gallbladder sludge without evidence of cholecystitis.  The inferior vena cava and pancreas are not visualized.   Electronically Signed   By: Inge Rise M.D.   On: 03/13/2014 15:35  CBC  Recent Labs Lab 03/13/14 1315 03/14/14 0430 03/15/14 0556 03/16/14 0624 03/17/14 0510 03/18/14 0447  WBC 9.3 8.7 8.8 8.9 7.6 7.9  HGB 15.2* 12.7 12.1 12.2 11.1* 10.8*  HCT 42.8 37.3 36.0 35.9* 32.5* 32.0*  PLT 189 157 143* 168 166 192  MCV 91.1 92.8 95.2 92.8 93.7 92.2  MCH 32.3 31.6 32.0 31.5 32.0 31.1  MCHC 35.5 34.0 33.6 34.0 34.2 33.8  RDW 12.9 13.4 13.2 13.2 13.1 13.2  LYMPHSABS 0.6* 1.2  --   --   --   --   MONOABS 0.4 0.6  --   --   --   --   EOSABS 0.0 0.1  --   --   --   --   BASOSABS 0.0 0.0  --   --   --   --     Chemistries   Recent Labs Lab 03/13/14 1315 03/14/14 0430 03/15/14 0556 03/16/14 0624 03/17/14 0830  NA 140 136 142 137 136  K 3.9 3.3* 3.4* 4.0 4.6  CL 104 103 107 105 105  CO2 24 24 22 24 27   GLUCOSE 145* 91 72 101* 124*  BUN 17 15 13 9 10   CREATININE 0.88 0.79 0.75 0.66 0.66  CALCIUM 9.7 8.1* 8.6 8.6 8.8  AST 211* 103* 42* 28 36  ALT 241* 155* 94* 70* 66*  ALKPHOS 181* 138* 103 94 92  BILITOT 4.3* 1.2 1.1 1.4* 0.8    ------------------------------------------------------------------------------------------------------------------ estimated creatinine clearance is 76.5 mL/min (by C-G formula based on Cr of 0.66). ------------------------------------------------------------------------------------------------------------------ No results for input(s): HGBA1C in the last 72 hours. ------------------------------------------------------------------------------------------------------------------ No results for input(s): CHOL, HDL, LDLCALC, TRIG, CHOLHDL, LDLDIRECT in the last 72 hours. ------------------------------------------------------------------------------------------------------------------ No results for input(s): TSH, T4TOTAL, T3FREE, THYROIDAB in the last 72 hours.  Invalid input(s): FREET3 ------------------------------------------------------------------------------------------------------------------ No results for input(s): VITAMINB12, FOLATE, FERRITIN, TIBC, IRON, RETICCTPCT in the last 72 hours.  Coagulation profile  Recent Labs Lab 03/13/14 2218  INR 1.26    No results for input(s): DDIMER in the last 72 hours.  Cardiac Enzymes No results for input(s): CKMB, TROPONINI, MYOGLOBIN in the last 168 hours.  Invalid input(s): CK ------------------------------------------------------------------------------------------------------------------ Invalid input(s): POCBNP     Time Spent in minutes   35   Miranda Frese K M.D on 03/18/2014 at 11:45 AM  Between 7am to 7pm - Pager - [phone]  After 7pm go to [email or web address] - [facility] Office  [phone]

## 2014-03-18 NOTE — Consult Note (Signed)
CARDIOLOGY CONSULT NOTE   Patient ID: Kayla Hale MRN: [id number], DOB/AGE: Mar 31, 1945   Admit date: 03/13/2014 Date of Consult: 03/18/2014   Primary Physician: Wenda Low, MD Primary Cardiologist: Dr. Acie Fredrickson  Pt. Profile  69 year old woman admitted with gallstone pancreatitis. History of chronic atrial fibrillation.  Problem List  Past Medical History  Diagnosis Date  . Hypertension   . Thyroid disease   . Hyperlipidemia   . Dyslipidemia   . Mild obesity   . Urinary incontinence   . Seasonal allergies   . Shingles 2005    LOWER BACK  . Diverticulosis 2004  . Mild anemia   . Chronic kidney disease     STAGE 2  . Atrial fibrillation   . Hypothyroidism   . GERD (gastroesophageal reflux disease)   . Gallstones     Past Surgical History  Procedure Laterality Date  . Colonoscopy    . Tubal ligation       Allergies  Allergies  Allergen Reactions  . Lisinopril     COUGH  . Norvasc [Amlodipine Besylate]     LEG EDEMA     HPI   This 69 year old woman was admitted on 03/13/14 with acute gallstone pancreatitis. She underwent laparoscopic cholecystectomy with cholangiogram by Dr. Donne Hazel on 03/16/14. She has done well postoperatively from the surgical standpoint. She has a past history of atrial fibrillation and is followed for this by Dr. Mertie Moores. She has been on anticoagulation at home with apixaban and has been on bisoprolol-hydrochlorothiazide 10/6.25 one daily at home with good rate control. She is essentially asymptomatic from her atrial fibrillation. No chest pain or shortness of breath. No TIA or stroke history. She is Chadss-vasc score of 3 for female, age 10, and hypertension. In the hospital she has had problems with erratic heartbeat. Initially postoperatively she was experiencing rapid ventricular response to her atrial fibrillation. Today after medication she has had transient significant bradycardia.  Inpatient Medications  . diltiazem  240  mg Oral Daily  . levothyroxine  75 mcg Oral QAC breakfast  . oxybutynin  5 mg Oral BID  . pantoprazole  40 mg Oral Daily  . polyethylene glycol  17 g Oral BID  . pravastatin  20 mg Oral q1800    Family History Family History  Problem Relation Age of Onset  . Heart attack Father   . Hypertension Father   . Heart attack Sister   . Diabetes Sister   . CVA Brother   . Diabetes Brother      Social History History   Social History  . Marital Status: Married    Spouse Name: N/A    Number of Children: N/A  . Years of Education: N/A   Occupational History  . Not on file.   Social History Main Topics  . Smoking status: Never Smoker   . Smokeless tobacco: Never Used  . Alcohol Use: No  . Drug Use: No  . Sexual Activity: Not on file   Other Topics Concern  . Not on file   Social History Narrative     Review of Systems  General:  No chills, fever, night sweats or weight changes.  Cardiovascular:  No chest pain, dyspnea on exertion, edema, orthopnea, palpitations, paroxysmal nocturnal dyspnea. Dermatological: No rash, lesions/masses Respiratory: No cough, dyspnea Urologic: No hematuria, dysuria Abdominal:   No nausea, vomiting, diarrhea, bright red blood per rectum, melena, or hematemesis Neurologic:  No visual changes, wkns, changes in mental status. All other systems reviewed and  are otherwise negative except as noted above.  Physical Exam  Blood pressure 123/76, pulse 90, temperature 97.7 F (36.5 C), temperature source Oral, resp. rate 18, height 5' 6.38" (1.686 m), weight 197 lb 11.2 oz (89.676 kg), SpO2 97 %.  General: Pleasant, NAD Psych: Normal affect. Neuro: Alert and oriented X 3. Moves all extremities spontaneously. HEENT: Normal  Neck: Supple without bruits or JVD. Lungs:  Resp regular and unlabored, CTA. Heart: Pulse is irregularly irregular. no s3, s4, or murmurs. Abdomen: Soft, non-tender, non-distended, BS + x 4.  Extremities: No clubbing, cyanosis  and there is trace edema. DP/PT/Radials 2+ and equal bilaterally.  Labs  No results for input(s): CKTOTAL, CKMB, TROPONINI in the last 72 hours. Lab Results  Component Value Date   WBC 7.9 03/18/2014   HGB 10.8* 03/18/2014   HCT 32.0* 03/18/2014   MCV 92.2 03/18/2014   PLT 192 03/18/2014     Recent Labs Lab 03/17/14 0830  NA 136  K 4.6  CL 105  CO2 27  BUN 10  CREATININE 0.66  CALCIUM 8.8  PROT 6.3  BILITOT 0.8  ALKPHOS 92  ALT 66*  AST 36  GLUCOSE 124*   No results found for: CHOL, HDL, LDLCALC, TRIG No results found for: DDIMER  Radiology/Studies  Dg Chest 2 View  03/13/2014   CLINICAL DATA:  Chest pain and abdominal pain.  Vomiting.  EXAM: CHEST  2 VIEW  COMPARISON:  None.  FINDINGS: Heart size and pulmonary vascularity are normal and the lungs are clear. No acute osseous abnormality. No effusions.  IMPRESSION: No active cardiopulmonary disease.   Electronically Signed   By: Rozetta Nunnery M.D.   On: 03/13/2014 14:44   Dg Cholangiogram Operative  03/16/2014   CLINICAL DATA:  Cholelithiasis, status post laparoscopic cholecystectomy  EXAM: INTRAOPERATIVE CHOLANGIOGRAM  TECHNIQUE: Cholangiographic images from the C-arm fluoroscopic device were submitted for interpretation post-operatively. Please see the procedural report for the amount of contrast and the fluoroscopy time utilized.  COMPARISON:  03/13/2014  FINDINGS: Intraoperative cholangiogram performed during laparoscopic cholecystectomy. The intrahepatic ducts, biliary confluence, common hepatic duct, cystic duct, common bile duct, and refluxed pancreatic duct are all patent. Contrast eventually drains into the duodenum. No dilatation or obstruction.  IMPRESSION: Patent biliary system.   Electronically Signed   By: Daryll Brod M.D.   On: 03/16/2014 13:27   US Abdomen Complete  03/13/2014   CLINICAL DATA:  Abdominal pain and vomiting.  EXAM: ULTRASOUND ABDOMEN COMPLETE  COMPARISON:  None.  FINDINGS: Gallbladder: A  single stone measuring 2.3 cm is seen in the fundus of the gallbladder and there is a small amount of gallbladder sludge. No wall thickening or pericholecystic fluid is identified. Sonographer reports negative Murphy's sign.  Common bile duct: Diameter: 0.4 cm  Liver: No focal lesion identified. Within normal limits in parenchymal echogenicity.  IVC: Not visualized.  Pancreas: Not visualized.  Spleen: Size and appearance within normal limits.  Right Kidney: Length: 9.3 cm. Echogenicity within normal limits. No mass or hydronephrosis visualized.  Left Kidney: Length: 9.5 cm. Echogenicity within normal limits. No mass or hydronephrosis visualized.  Abdominal aorta: No aneurysm visualized.  Other findings: None.  IMPRESSION: Single 2.3 cm stone in the fundus of the gallbladder and a small amount of gallbladder sludge without evidence of cholecystitis.  The inferior vena cava and pancreas are not visualized.   Electronically Signed   By: Inge Rise M.D.   On: 03/13/2014 15:35    ECG on 03/13/58  Atrial fibrillation Abnormal ECG Personally reviewed  ASSESSMENT AND PLAN  1. Chronic atrial fibrillation, chads vasc score of 3.  Ejection fraction 55-60% by echocardiogram in October 2015 2. Status post laparoscopic cholecystectomy for stone pancreatitis, doing well postop.  Recommendation: I will stop her diltiazem today and plan on resuming her outpatient bisoprolol-HCTZ tomorrow at discharge. She has done well on that drug over a period of time. Anticipate discharge in the morning resuming her bisoprolol HCTZ and resuming her apixaban. She should follow-up with Dr.Nahser in about 1 week.  Signed, Darlin Coco, MD  03/18/2014, 3:03 PM

## 2014-03-18 NOTE — Discharge Instructions (Signed)
[place]  Follow with Primary MD Wenda Low, MD and your Heart doctor in 3 days   Get CBC, CMP, 2 view Chest X ray checked  by Primary MD next visit.    Activity: As tolerated with Full fall precautions use walker/cane & assistance as needed   Disposition Home     Diet: Heart Healthy    For Heart failure patients - Check your Weight same time everyday, if you gain over 2 pounds, or you develop in leg swelling, experience more shortness of breath or chest pain, call your Primary MD immediately. Follow Cardiac Low Salt Diet and 1.8 lit/day fluid restriction.   On your next visit with your primary care physician please Get Medicines reviewed and adjusted.   Please request your Prim.MD to go over all Hospital Tests and Procedure/Radiological results at the follow up, please get all Hospital records sent to your Prim MD by signing hospital release before you go home.   If you experience worsening of your admission symptoms, develop shortness of breath, life threatening emergency, suicidal or homicidal thoughts you must seek medical attention immediately by calling 911 or calling your MD immediately  if symptoms less severe.  You Must read complete instructions/literature along with all the possible adverse reactions/side effects for all the Medicines you take and that have been prescribed to you. Take any new Medicines after you have completely understood and accpet all the possible adverse reactions/side effects.   Do not drive, operating heavy machinery, perform activities at heights, swimming or participation in water activities or provide baby sitting services if your were admitted for syncope or siezures until you have seen by Primary MD or a Neurologist and advised to do so again.  Do not drive when taking Pain medications.    Do not take more than prescribed Pain, Sleep and Anxiety Medications  Special Instructions: If you have smoked or chewed Tobacco  in the  last 2 yrs please stop smoking, stop any regular Alcohol  and or any Recreational drug use.  Wear Seat belts while driving.   Please note  You were cared for by a hospitalist during your hospital stay. If you have any questions about your discharge medications or the care you received while you were in the hospital after you are discharged, you can call the unit and asked to speak with the hospitalist on call if the hospitalist that took care of you is not available. Once you are discharged, your primary care physician will handle any further medical issues. Please note that NO REFILLS for any discharge medications will be authorized once you are discharged, as it is imperative that you return to your primary care physician (or establish a relationship with a primary care physician if you do not have one) for your aftercare needs so that they can reassess your need for medications and monitor your lab values.     [facility] ______[facility] LAPAROSCOPIC SURGERY: POST OP INSTRUCTIONS Always review your discharge instruction sheet given to you by the facility where your surgery was performed. IF YOU HAVE DISABILITY OR FAMILY LEAVE FORMS, YOU MUST BRING THEM TO THE OFFICE FOR PROCESSING.   DO NOT GIVE THEM TO YOUR DOCTOR.  1. A prescription for pain medication may be given to you upon discharge.  Take your pain medication as prescribed, if needed.  If narcotic pain medicine is not needed, then you may take acetaminophen (Tylenol) or ibuprofen (Advil) as needed. 2. Take your usually prescribed medications unless  otherwise directed. 3. If you need a refill on your pain medication, please contact your pharmacy.  They will contact our office to request authorization. Prescriptions will not be filled after 5pm or on week-ends. 4. You should follow a light diet the first few days after arrival home, such as soup and crackers, etc.  Be sure to include lots of fluids daily. 5. Most patients  will experience some swelling and bruising in the area of the incisions.  Ice packs will help.  Swelling and bruising can take several days to resolve.  6. It is common to experience some constipation if taking pain medication after surgery.  Increasing fluid intake and taking a stool softener (such as Colace) will usually help or prevent this problem from occurring.  A mild laxative (Milk of Magnesia or Miralax) should be taken according to package instructions if there are no bowel movements after 48 hours. 7. Unless discharge instructions indicate otherwise, you may remove your bandages 24-48 hours after surgery, and you may shower at that time.  You may have steri-strips (small skin tapes) in place directly over the incision.  These strips should be left on the skin for 7-10 days.  If your surgeon used skin glue on the incision, you may shower in 24 hours.  The glue will flake off over the next 2-3 weeks.  Any sutures or staples will be removed at the office during your follow-up visit. 8. ACTIVITIES:  You may resume regular (light) daily activities beginning the next day--such as daily self-care, walking, climbing stairs--gradually increasing activities as tolerated.  You may have sexual intercourse when it is comfortable.  Refrain from any heavy lifting or straining until approved by your doctor. a. You may drive when you are no longer taking prescription pain medication, you can comfortably wear a seatbelt, and you can safely maneuver your car and apply brakes. b. RETURN TO WORK:  __________________________________________________________ 9. You should see your doctor in the office for a follow-up appointment approximately 2-3 weeks after your surgery.  Make sure that you call for this appointment within a day or two after you arrive home to insure a convenient appointment time. 10. OTHER INSTRUCTIONS:  __________________________________________________________________________________________________________________________ __________________________________________________________________________________________________________________________ WHEN TO CALL YOUR DOCTOR: 1. Fever over 101.0 2. Inability to urinate 3. Continued bleeding from incision. 4. Increased pain, redness, or drainage from the incision. 5. Increasing abdominal pain  The clinic staff is available to answer your questions during regular business hours.  Please dont hesitate to call and ask to speak to one of the nurses for clinical concerns.  If you have a medical emergency, go to the nearest emergency room or call 911.  A surgeon from [facility] is always on call at the hospital. [address], [place], [place], [place]  [address] ? [address], [place], [place]   [address] [phone] ? [phone] ? FAX [phone] Web site: [email or web address]

## 2014-03-18 NOTE — Progress Notes (Signed)
ANTICOAGULATION CONSULT NOTE - Follow Up Consult  Pharmacy Consult for Heparin Indication: atrial fibrillation  Allergies  Allergen Reactions  . Lisinopril     COUGH  . Norvasc [Amlodipine Besylate]     LEG EDEMA     Patient Measurements: Height: 5' 6.38" (168.6 cm) Weight: 197 lb 11.2 oz (89.676 kg) IBW/kg (Calculated) : 60.17 Heparin Dosing Weight: ~ 79kg  Vital Signs: Temp: 98.2 F (36.8 C) (01/17 0400) Temp Source: Oral (01/17 0400) BP: 115/70 mmHg (01/17 0400) Pulse Rate: 79 (01/17 0400)  Labs:  Recent Labs  03/16/14 0624 03/17/14 0510 03/17/14 0830 03/17/14 1827 03/18/14 0447  HGB 12.2 11.1*  --   --  10.8*  HCT 35.9* 32.5*  --   --  32.0*  PLT 168 166  --   --  192  HEPARINUNFRC 0.16*  --   --  0.32 0.38  CREATININE 0.66  --  0.66  --   --     Estimated Creatinine Clearance: 76.5 mL/min (by C-G formula based on Cr of 0.66).   Medications:  Heparin @ 1000 units/hr  Assessment: 68yof s/p cholecystectomy on 1/15 continues on heparin for afib. Deferring resuming her home apixaban until tomorrow. Heparin level is therapeutic. CBC is stable. No bleeding reported.   Goal of Therapy:  Heparin level 0.3-0.7 units/ml Monitor platelets by anticoagulation protocol: Yes   Plan:  1) Continue heparin at 1000 units/hr 2) Follow up heparin level, CBC in AM  Deboraha Sprang 03/18/2014,7:21 AM

## 2014-03-19 ENCOUNTER — Encounter (HOSPITAL_COMMUNITY): Payer: Self-pay | Admitting: General Surgery

## 2014-03-19 DIAGNOSIS — I10 Essential (primary) hypertension: Secondary | ICD-10-CM | POA: Diagnosis present

## 2014-03-19 DIAGNOSIS — Z7901 Long term (current) use of anticoagulants: Secondary | ICD-10-CM

## 2014-03-19 DIAGNOSIS — E039 Hypothyroidism, unspecified: Secondary | ICD-10-CM | POA: Diagnosis present

## 2014-03-19 DIAGNOSIS — K851 Biliary acute pancreatitis: Secondary | ICD-10-CM

## 2014-03-19 DIAGNOSIS — E785 Hyperlipidemia, unspecified: Secondary | ICD-10-CM | POA: Diagnosis present

## 2014-03-19 LAB — CBC
HCT: 31.2 % — ABNORMAL LOW (ref 36.0–46.0)
Hemoglobin: 10.4 g/dL — ABNORMAL LOW (ref 12.0–15.0)
MCH: 31 pg (ref 26.0–34.0)
MCHC: 33.3 g/dL (ref 30.0–36.0)
MCV: 92.9 fL (ref 78.0–100.0)
Platelets: 181 10*3/uL (ref 150–400)
RBC: 3.36 MIL/uL — ABNORMAL LOW (ref 3.87–5.11)
RDW: 13.3 % (ref 11.5–15.5)
WBC: 8.2 10*3/uL (ref 4.0–10.5)

## 2014-03-19 LAB — HEPARIN LEVEL (UNFRACTIONATED): Heparin Unfractionated: 0.29 IU/mL — ABNORMAL LOW (ref 0.30–0.70)

## 2014-03-19 MED ORDER — APIXABAN 5 MG PO TABS
5.0000 mg | ORAL_TABLET | Freq: Two times a day (BID) | ORAL | Status: DC
  Administered 2014-03-19: 5 mg via ORAL
  Filled 2014-03-19: qty 1

## 2014-03-19 NOTE — Progress Notes (Signed)
ANTICOAGULATION CONSULT NOTE - Follow Up Consult  Pharmacy Consult for Heparin Indication: atrial fibrillation  Allergies  Allergen Reactions  . Lisinopril     COUGH  . Norvasc [Amlodipine Besylate]     LEG EDEMA     Patient Measurements: Height: 5' 6.38" (168.6 cm) Weight: 197 lb 3.2 oz (89.449 kg) IBW/kg (Calculated) : 60.17 Heparin Dosing Weight: ~ 79kg  Vital Signs: Temp: 98.6 F (37 C) (01/18 0456) Temp Source: Oral (01/18 0456) BP: 127/68 mmHg (01/18 0456) Pulse Rate: 82 (01/18 0456)  Labs:  Recent Labs  03/17/14 0510 03/17/14 0830 03/17/14 1827 03/18/14 0447 03/19/14 0540  HGB 11.1*  --   --  10.8* 10.4*  HCT 32.5*  --   --  32.0* 31.2*  PLT 166  --   --  192 181  HEPARINUNFRC  --   --  0.32 0.38 0.29*  CREATININE  --  0.66  --   --   --     Estimated Creatinine Clearance: 76.4 mL/min (by C-G formula based on Cr of 0.66).   Medications:  . heparin 1,000 Units/hr (03/18/14 1303)    Assessment: 51 yof s/p cholecystectomy on 1/15 continues on heparin for afib. She will be resuming her home apixaban until today. Heparin level is just below therapeutic range. CBC is stable. No bleeding reported.   Goal of Therapy:  Heparin level 0.3-0.7 units/ml Monitor platelets by anticoagulation protocol: Yes   Plan:  1) Increase heparin drip to 1100 units/hr until apixaban resumed 2) Follow up heparin level, CBC in AM  [facility], [place].D., BCPS Clinical Pharmacist Pager: [phone] 03/19/2014 9:21 AM

## 2014-03-19 NOTE — Progress Notes (Signed)
Notified Luke Kilroy PA HR 160's sitting on side of bed, instructed to give am dose of bystolic now. Carroll Kinds RN

## 2014-03-19 NOTE — Progress Notes (Signed)
Physical Therapy Treatment Patient Details Name: Kayla Hale MRN: [id number] DOB: 1945-11-19 Today's Date: 03/21/14    History of Present Illness Patient is a 69 yo female admitted 03/13/14 with acute gallstone pancreatitis.  Patient s/p cholecystectomy 03/16/14.  PMH:  chronic Afib, HTN, HLD, hypothyroidism.    PT Comments    Patient making good gains with mobility and gait.  Ready for d/c from PT perspective.  Reinforced use of RW for safety initially at d/c.  Follow Up Recommendations  No PT follow up;Supervision - Intermittent     Equipment Recommendations  None recommended by PT    Recommendations for Other Services       Precautions / Restrictions Precautions Precautions: Fall Restrictions Weight Bearing Restrictions: No    Mobility  Bed Mobility                  Transfers Overall transfer level: Independent Equipment used: None                Ambulation/Gait Ambulation/Gait assistance: Supervision Ambulation Distance (Feet): 140 Feet Assistive device: None Gait Pattern/deviations: Step-through pattern;Decreased stride length;Staggering right Gait velocity: Decreased Gait velocity interpretation: Below normal speed for age/gender General Gait Details: Patient with good gait pattern.  Slightly unsteady, but able to self-correct.  Patient with HR at 144 with gait.  Returned to chair.  Patient with no symptoms with increased HR.  Reviewed with patient to use RW for safety initially when returning home.   Stairs            Wheelchair Mobility    Modified Rankin (Stroke Patients Only)       Balance                                    Cognition Arousal/Alertness: Awake/alert Behavior During Therapy: WFL for tasks assessed/performed Overall Cognitive Status: Within Functional Limits for tasks assessed                      Exercises      General Comments        Pertinent Vitals/Pain Pain Assessment:  No/denies pain    Home Living                      Prior Function            PT Goals (current goals can now be found in the care plan section) Progress towards PT goals: Progressing toward goals    Frequency  Min 3X/week    PT Plan Current plan remains appropriate    Co-evaluation             End of Session Equipment Utilized During Treatment: Gait belt Activity Tolerance: Patient tolerated treatment well (HR increased 144) Patient left: in chair;with call bell/phone within reach     Time: 1021-1031 PT Time Calculation (min) (ACUTE ONLY): 10 min  Charges:  $Gait Training: 8-22 mins                    G Codes:      Despina Pole March 21, 2014, 10:59 AM Carita Pian. Sanjuana Kava, [place] Pager [phone]

## 2014-03-19 NOTE — Progress Notes (Signed)
    Subjective:  No complaints, tolerating POs.  Objective:  Vital Signs in the last 24 hours: Temp:  [98.6 F (37 C)-98.8 F (37.1 C)] 98.6 F (37 C) (01/18 0456) Pulse Rate:  [82-104] 82 (01/18 0456) Resp:  [18] 18 (01/18 0456) BP: (127-139)/(68-79) 127/68 mmHg (01/18 0456) SpO2:  [98 %-99 %] 98 % (01/18 0456) Weight:  [197 lb 3.2 oz (89.449 kg)] 197 lb 3.2 oz (89.449 kg) (01/18 0505)  Intake/Output from previous day:  Intake/Output Summary (Last 24 hours) at 03/19/14 0753 Last data filed at 03/19/14 0733  Gross per 24 hour  Intake  835.5 ml  Output   1226 ml  Net -390.5 ml    Physical Exam: General appearance: alert, cooperative and no distress Lungs: few basilar crackles Heart: irregularly irregular rhythm and soft systolic murmur   Rate: 75-883  Rhythm: atrial fibrillation  Lab Results:  Recent Labs  03/18/14 0447 03/19/14 0540  WBC 7.9 8.2  HGB 10.8* 10.4*  PLT 192 181    Recent Labs  03/17/14 0830  NA 136  K 4.6  CL 105  CO2 27  GLUCOSE 124*  BUN 10  CREATININE 0.66   No results for input(s): TROPONINI in the last 72 hours.  Invalid input(s): CK, MB No results for input(s): INR in the last 72 hours.  Imaging: Imaging results have been reviewed  Cardiac Studies: Echo Oct 2015 - LVEF 55-60%, mild LVH, aortic valve sclerosis with mild AI, mild MV thickening with mild MR, severe LAE, mild RAE.  Assessment/Plan:  69 year old woman was admitted on 03/13/14 with acute gallstone pancreatitis. She underwent laparoscopic cholecystectomy with cholangiogram by Dr. Donne Hazel on 03/16/14. She has done well postoperatively from the surgical standpoint. She has a past history of atrial fibrillation and is followed for this by Dr. Mertie Moores. She has been on anticoagulation at home with apixaban and has been on bisoprolol-hydrochlorothiazide 10/6.25 one daily at home with good rate control. She is essentially asymptomatic from her atrial fibrillation.  No chest pain or shortness of breath. No TIA or stroke history. She is Chadss-vasc score of 3 for female, age 69, and hypertension. In the hospital she has had problems with erratic heartbeat. Initially postoperatively she was experiencing rapid ventricular response to her atrial fibrillation and was placed on Diltiazem. On 03/18/14 after Diltiazem (new for her) she has had transient significant bradycardia. This was discontinued and she has done well since.   Principal Problem:   Pancreatitis due to biliary obstruction Active Problems:   Chronic atrial fibrillation- Chadss-vasc score of 3 for female, age 69, and hypertension   Chronic anticoagulation   HTN (hypertension)   Dyslipidemia   Hypothyroid-TSH WNL   PLAN: OK to discharge from cardiology standpoint, resume home meds, we will arrange for follow up in 1-2 weeks as an OP.   Kerin Ransom PA-C Bee[facility] [phone] 03/19/2014, 7:53 AM

## 2014-03-19 NOTE — Care Management Note (Signed)
    Page 1 of 1   03/19/2014     12:20:08 PM CARE MANAGEMENT NOTE 03/19/2014  Patient:  Kayla Hale, Kayla Hale   Account Number:  [id number]  Date Initiated:  03/16/2014  Documentation initiated by:  Magdalen Spatz  Subjective/Objective Assessment:     Action/Plan:   Anticipated DC Date:     Anticipated DC Plan:           Choice offered to / List presented to:             Status of service:  Completed, signed off Medicare Important Message given?  YES (If response is "NO", the following Medicare IM given date fields will be blank) Date Medicare IM given:  03/16/2014 Medicare IM given by:  Magdalen Spatz Date Additional Medicare IM given:  03/20/2014 Additional Medicare IM given by:  Kelsea Mousel GRAVES-BIGELOW  Discharge Disposition:  HOME/SELF CARE  Per UR Regulation:  Reviewed for med. necessity/level of care/duration of stay  If discussed at [place] of Stay Meetings, dates discussed:    Comments:  03-19-14 1219 Jacqlyn Krauss, RN,BSN [phone] No needs identified by CM at this time. Pt d/c home no needs.

## 2014-03-21 ENCOUNTER — Ambulatory Visit: Payer: Medicare Other | Admitting: Cardiovascular Disease

## 2014-03-23 ENCOUNTER — Ambulatory Visit (HOSPITAL_COMMUNITY): Payer: Self-pay

## 2014-03-26 ENCOUNTER — Ambulatory Visit (INDEPENDENT_AMBULATORY_CARE_PROVIDER_SITE_OTHER): Payer: Medicare Other | Admitting: Cardiovascular Disease

## 2014-03-26 ENCOUNTER — Encounter: Payer: Self-pay | Admitting: Cardiovascular Disease

## 2014-03-26 VITALS — BP 116/80 | HR 118 | Ht 66.0 in | Wt 184.0 lb

## 2014-03-26 DIAGNOSIS — I482 Chronic atrial fibrillation, unspecified: Secondary | ICD-10-CM

## 2014-03-26 DIAGNOSIS — Z7901 Long term (current) use of anticoagulants: Secondary | ICD-10-CM

## 2014-03-26 DIAGNOSIS — I10 Essential (primary) hypertension: Secondary | ICD-10-CM

## 2014-03-26 MED ORDER — METOPROLOL TARTRATE 50 MG PO TABS: 50.0000 mg | ORAL_TABLET | Freq: Two times a day (BID) | ORAL | Status: DC

## 2014-03-26 NOTE — Patient Instructions (Signed)
**Note De-Identified Kayla Hale Obfuscation** Your physician has recommended you make the following change in your medication: stop taking Ziac and start taking Metoprolol 50 mg twice daily  Your physician recommends that you schedule a follow-up appointment in: 3 months

## 2014-03-26 NOTE — Progress Notes (Signed)
Cardiology Office Note   Date:  03/26/2014   ID:  Kayla Hale, DOB Dec 16, 1945, MRN [id number]  PCP:  Wenda Low, MD  Cardiologist:   Thayer Headings, MD   Chief Complaint  Patient presents with  . Atrial Fibrillation      History of Present Illness: Kayla Hale is a 69 y.o. female who presents for follow up of her atrial fib.  1. Atrial fibrillation - CHADS VASC score = 3 2. Hypertension  History of Present Illness:  Kayla Hale is a 69 yo who is referred for atrial fib. Hx of HTN.  She is basically a symptomatically she was found to have an irregular heart rate on A visit to Urgent care ( which was for a bladder infection) . EKG revealed atrial fibrillation. She does have occasional episodes of palpitations but he seemed to be more related to stress.  Nonsmoker Rare ETOH -  Fhx - father died of MI. ( age 47s), sister has CAd,,  March 26, 2014:  Kayla Hale was admitted to the hospital with gallstone pancreatitis in mid January.  She had a laparoscopic cholecystectomy. She is doing well. No further abdominal pain or vomitting.    Past Medical History  Diagnosis Date  . Hypertension   . Thyroid disease   . Hyperlipidemia   . Dyslipidemia   . Mild obesity   . Urinary incontinence   . Seasonal allergies   . Shingles 2005    LOWER BACK  . Diverticulosis 2004  . Mild anemia   . Chronic kidney disease     STAGE 2  . Atrial fibrillation   . Hypothyroidism   . GERD (gastroesophageal reflux disease)   . Gallstones     Past Surgical History  Procedure Laterality Date  . Colonoscopy    . Tubal ligation    . Cholecystectomy N/A 03/16/2014    Procedure: LAPAROSCOPIC CHOLECYSTECTOMY WITH INTRAOPERATIVE CHOLANGIOGRAM;  Surgeon: Rolm Bookbinder, MD;  Location: [place];  Service: General;  Laterality: N/A;     Current Outpatient Prescriptions  Medication Sig Dispense Refill  . ALPRAZolam (XANAX) 0.5 MG tablet Take 0.5 mg by mouth See admin instructions.  Takes 1 tablet once a week as needed    . apixaban (ELIQUIS) 5 MG TABS tablet Take 1 tablet (5 mg total) by mouth 2 (two) times daily. 180 tablet 3  . bisoprolol-hydrochlorothiazide (ZIAC) 10-6.25 MG per tablet Take 1 tablet by mouth daily.    . Calcium Carbonate-Vitamin D (CALCIUM-VITAMIN D) 500-200 MG-UNIT per tablet Take 1 tablet by mouth daily.    . cholecalciferol (VITAMIN D) 1000 UNITS tablet Take 1,000 Units by mouth daily.    Marland Kitchen levothyroxine (SYNTHROID, LEVOTHROID) 75 MCG tablet Take 75 mcg by mouth daily before breakfast.    . Melatonin 200 MCG TABS Take 200 mcg by mouth at bedtime as needed.    . Multiple Vitamin (MULTIVITAMIN) tablet Take 1 tablet by mouth daily.    Marland Kitchen omeprazole (PRILOSEC) 20 MG capsule Take 20 mg by mouth daily.    Marland Kitchen oxybutynin (DITROPAN) 5 MG tablet Take 5 mg by mouth See admin instructions. States she takes 2 to 3 tablets daily    . oxyCODONE-acetaminophen (PERCOCET/ROXICET) 5-325 MG per tablet Take 1 tablet by mouth every 4 (four) hours as needed for moderate pain. 30 tablet 0  . polyethylene glycol (MIRALAX / GLYCOLAX) packet Take 17 g by mouth 2 (two) times daily. 14 each 0  . pravastatin (PRAVACHOL) 20 MG tablet Take 20  mg by mouth daily.     No current facility-administered medications for this visit.    Allergies:   Lisinopril and Norvasc    Social History:  The patient  reports that she has never smoked. She has never used smokeless tobacco. She reports that she does not drink alcohol or use illicit drugs.   Family History:  The patient's family history includes CVA in her brother; Diabetes in her brother and sister; Heart attack in her father and sister; Hypertension in her father.    ROS:  Please see the history of present illness.   Otherwise, review of systems are positive for none.   All other systems are reviewed and negative.    PHYSICAL EXAM: VS:  BP 116/80 mmHg  Pulse 118  Ht 5\' 6"  (1.676 m)  Wt 184 lb (83.462 kg)  BMI 29.71 kg/m2 , BMI  Body mass index is 29.71 kg/(m^2). GEN: Well nourished, well developed, in no acute distress HEENT: normal Neck: no JVD, carotid bruits, or masses Cardiac: Irreg. Irreg ; no murmurs, rubs, or gallops,no edema  Respiratory:  clear to auscultation bilaterally, normal work of breathing GI: soft, nontender, nondistended, + BS MS: no deformity or atrophy Skin: warm and dry, no rash Neuro:  Strength and sensation are intact Psych: normal   EKG:  EKG is   ordered today.  He reveals atrial fib ablation with rapid ventricular response. Her ventricular rate is 118.   Recent Labs: 03/17/2014: ALT 66*; BUN 10; Creatinine 0.66; Potassium 4.6; Sodium 136 03/18/2014: TSH 1.875 03/19/2014: Hemoglobin 10.4*; Platelets 181    Lipid Panel No results found for: CHOL, TRIG, HDL, CHOLHDL, VLDL, LDLCALC, LDLDIRECT    Wt Readings from Last 3 Encounters:  03/26/14 184 lb (83.462 kg)  12/05/13 185 lb 6.4 oz (84.097 kg)      Other studies Reviewed: Additional studies/ records that were reviewed today include: records from recent hospitalization . Review of the above records demonstrates:  Chronic atrial fib.    ASSESSMENT AND PLAN:  1.  Atrial fib :  Her afib rate is a bit fast a day. I would like to discontinue the bisoprolol / HCTZ  and start her on metoprolol 50 kg twice a day.  We can add back in a separate prescription of HCTZ if needed. The goal is to get her heart rate around 80-90 range. Continue Eliquis.  Current medicines are reviewed at length with the patient today.  The patient does not have concerns regarding medicines.  The following changes have been made:   DC Ziac, start Metoprolol 50 bid   Labs/ tests ordered today include:  No orders of the defined types were placed in this encounter.     Disposition:   FU with  Me in 3 months .     Signed, Makayla Confer, Wonda Cheng, MD  03/26/2014 3:42 PM    [facility] [address], [place], [place]  [address] Phone:  [phone]; Fax: [phone]

## 2014-03-29 ENCOUNTER — Ambulatory Visit (HOSPITAL_COMMUNITY)
Admission: RE | Admit: 2014-03-29 | Discharge: 2014-03-29 | Disposition: A | Discharge status: Home / Self Care | Payer: Medicare Other | Source: Ambulatory Visit | Attending: Internal Medicine | Admitting: Internal Medicine

## 2014-03-29 DIAGNOSIS — Z1231 Encounter for screening mammogram for malignant neoplasm of breast: Secondary | ICD-10-CM | POA: Diagnosis not present

## 2014-03-30 ENCOUNTER — Other Ambulatory Visit: Payer: Self-pay | Admitting: *Deleted

## 2014-03-30 MED ORDER — METOPROLOL TARTRATE 50 MG PO TABS: 50.0000 mg | ORAL_TABLET | Freq: Two times a day (BID) | ORAL | Status: DC

## 2014-06-25 NOTE — Progress Notes (Signed)
Cardiology Office Note   Date:  06/25/2014   ID:  Kayla Hale, DOB 1945/03/08, MRN [id number]  PCP:  Wenda Low, MD  Cardiologist:   Thayer Headings, MD   No chief complaint on file.  1. Atrial fibrillation - CHADS VASC score = 3 2. Hypertension  History of Present Illness:  Kayla Hale is a 69 yo who is referred for atrial fib. Hx of HTN.  She is basically a symptomatically she was found to have an irregular heart rate on A visit to Urgent care ( which was for a bladder infection) . EKG revealed atrial fibrillation. She does have occasional episodes of palpitations but he seemed to be more related to stress.  Nonsmoker Rare ETOH -  Fhx - father died of MI. ( age 63s), sister has CAd,,   April. 26, 2016: Kayla Hale is a 69 y.o. female who presents for  Atrial fib.  She's done very well. She's been keeping a record of her blood pressure readings and for the most part they are  In the normal  Range. She has not been cardioverted.   She has had atrial fib for many years.    Past Medical History  Diagnosis Date  . Hypertension   . Thyroid disease   . Hyperlipidemia   . Dyslipidemia   . Mild obesity   . Urinary incontinence   . Seasonal allergies   . Shingles 2005    LOWER BACK  . Diverticulosis 2004  . Mild anemia   . Chronic kidney disease     STAGE 2  . Atrial fibrillation   . Hypothyroidism   . GERD (gastroesophageal reflux disease)   . Gallstones     Past Surgical History  Procedure Laterality Date  . Colonoscopy    . Tubal ligation    . Cholecystectomy N/A 03/16/2014    Procedure: LAPAROSCOPIC CHOLECYSTECTOMY WITH INTRAOPERATIVE CHOLANGIOGRAM;  Surgeon: Rolm Bookbinder, MD;  Location: [place];  Service: General;  Laterality: N/A;     Current Outpatient Prescriptions  Medication Sig Dispense Refill  . ALPRAZolam (XANAX) 0.5 MG tablet Take 0.5 mg by mouth See admin instructions. Takes 1 tablet once a week as needed    . apixaban (ELIQUIS) 5 MG TABS  tablet Take 1 tablet (5 mg total) by mouth 2 (two) times daily. 180 tablet 3  . Calcium Carbonate-Vitamin D (CALCIUM-VITAMIN D) 500-200 MG-UNIT per tablet Take 1 tablet by mouth daily.    . cholecalciferol (VITAMIN D) 1000 UNITS tablet Take 1,000 Units by mouth daily.    Marland Kitchen levothyroxine (SYNTHROID, LEVOTHROID) 75 MCG tablet Take 75 mcg by mouth daily before breakfast.    . Melatonin 200 MCG TABS Take 200 mcg by mouth at bedtime as needed.    . metoprolol (LOPRESSOR) 50 MG tablet Take 1 tablet (50 mg total) by mouth 2 (two) times daily. 180 tablet 0  . Multiple Vitamin (MULTIVITAMIN) tablet Take 1 tablet by mouth daily.    Marland Kitchen omeprazole (PRILOSEC) 20 MG capsule Take 20 mg by mouth daily.    Marland Kitchen oxybutynin (DITROPAN) 5 MG tablet Take 5 mg by mouth See admin instructions. States she takes 2 to 3 tablets daily    . oxyCODONE-acetaminophen (PERCOCET/ROXICET) 5-325 MG per tablet Take 1 tablet by mouth every 4 (four) hours as needed for moderate pain. 30 tablet 0  . polyethylene glycol (MIRALAX / GLYCOLAX) packet Take 17 g by mouth 2 (two) times daily. 14 each 0  . pravastatin (PRAVACHOL) 20 MG tablet  Take 20 mg by mouth daily.     No current facility-administered medications for this visit.    Allergies:   Lisinopril and Norvasc    Social History:  The patient  reports that she has never smoked. She has never used smokeless tobacco. She reports that she does not drink alcohol or use illicit drugs.   Family History:  The patient's family history includes CVA in her brother; Diabetes in her brother and sister; Heart attack in her father and sister; Hypertension in her father.    ROS:  Please see the history of present illness.    Review of Systems: Constitutional:  denies fever, chills, diaphoresis, appetite change and fatigue.  HEENT: denies photophobia, eye pain, redness, hearing loss, ear pain, congestion, sore throat, rhinorrhea, sneezing, neck pain, neck stiffness and tinnitus.  Respiratory:  denies SOB, DOE, cough, chest tightness, and wheezing.  Cardiovascular: denies chest pain, palpitations and leg swelling.  Gastrointestinal: denies nausea, vomiting, abdominal pain, diarrhea, constipation, blood in stool.  Genitourinary: denies dysuria, urgency, frequency, hematuria, flank pain and difficulty urinating.  Musculoskeletal: denies  myalgias, back pain, joint swelling, arthralgias and gait problem.   Skin: denies pallor, rash and wound.  Neurological: denies dizziness, seizures, syncope, weakness, light-headedness, numbness and headaches.   Hematological: denies adenopathy, easy bruising, personal or family bleeding history.  Psychiatric/ Behavioral: denies suicidal ideation, mood changes, confusion, nervousness, sleep disturbance and agitation.       All other systems are reviewed and negative.    PHYSICAL EXAM: VS:  There were no vitals taken for this visit. , BMI There is no weight on file to calculate BMI. GEN: Well nourished, well developed, in no acute distress HEENT: normal Neck: no JVD, carotid bruits, or masses Cardiac: Irreg. Irreg; no murmurs, rubs, or gallops,no edema  Respiratory:  clear to auscultation bilaterally, normal work of breathing GI: soft, nontender, nondistended, + BS MS: no deformity or atrophy Skin: warm and dry, no rash Neuro:  Strength and sensation are intact Psych: normal   EKG:  EKG is not ordered today. The ekg ordered today demonstrates    Recent Labs: 03/17/2014: ALT 66*; BUN 10; Creatinine 0.66; Potassium 4.6; Sodium 136 03/18/2014: TSH 1.875 03/19/2014: Hemoglobin 10.4*; Platelets 181    Lipid Panel No results found for: CHOL, TRIG, HDL, CHOLHDL, VLDL, LDLCALC, LDLDIRECT    Wt Readings from Last 3 Encounters:  03/26/14 184 lb (83.462 kg)  12/05/13 185 lb 6.4 oz (84.097 kg)      Other studies Reviewed: Additional studies/ records that were reviewed today include: . Review of the above records demonstrates:     ASSESSMENT AND PLAN:  1. Atrial fibrillation - CHADS VASC score = 3 She is doing well.  Basically asymptomatic.  Continue current meds.  Continue eliquis   2. Hypertension - BP is well   Current medicines are reviewed at length with the patient today.  The patient does not have concerns regarding medicines.  The following changes have been made:  no change  Labs/ tests ordered today include:  No orders of the defined types were placed in this encounter.     Disposition:   FU with me in 6 months .     Signed, Karrin Eisenmenger, Wonda Cheng, MD  06/25/2014 8:52 AM    [facility], [place], [place]  [address] Phone: [phone]; Fax: [phone]

## 2014-06-26 ENCOUNTER — Ambulatory Visit (INDEPENDENT_AMBULATORY_CARE_PROVIDER_SITE_OTHER): Payer: Medicare Other | Admitting: Cardiovascular Disease

## 2014-06-26 ENCOUNTER — Encounter: Payer: Self-pay | Admitting: Cardiovascular Disease

## 2014-06-26 VITALS — BP 130/66 | HR 60 | Ht 66.0 in | Wt 184.6 lb

## 2014-06-26 DIAGNOSIS — I482 Chronic atrial fibrillation, unspecified: Secondary | ICD-10-CM

## 2014-06-26 DIAGNOSIS — Z7901 Long term (current) use of anticoagulants: Secondary | ICD-10-CM

## 2014-06-26 NOTE — Patient Instructions (Signed)
Medication Instructions:  None  Labwork: None  Testing/Procedures: None  Follow-Up: Your physician wants you to follow-up in: 6 months with Dr.Nasher. You will receive a reminder letter in the mail two months in advance. If you don't receive a letter, please call our office to schedule the follow-up appointment.   Any Other Special Instructions Will Be Listed Below (If Applicable).

## 2014-07-13 ENCOUNTER — Other Ambulatory Visit: Payer: Self-pay | Admitting: Cardiovascular Disease

## 2014-09-10 ENCOUNTER — Telehealth: Payer: Self-pay | Admitting: Cardiovascular Disease

## 2014-09-10 NOTE — Telephone Encounter (Signed)
Spoke with patient who states she cannot afford Eliquis any longer due to the recent loss of her husband and other stressful life events.  I advised her to call the patient help line and gave her the number.   I also advised she check other pharmacies for pricing and to call me back if she does not find the medication at a suitable price.  I advised her that Coumadin would be the least expensive cost at the pharmacy but there would be the additional cost of regular CVRR appointments.  I advised her to call me back to let me know what she finds out.  She verbalized understanding and agreement.

## 2014-09-10 NOTE — Telephone Encounter (Signed)
New Message      Pt calling stating that her Eliquis has gone up to $227 and she can't afford it, pt wants to know if she can be prescribed something else. Please call back and advise.

## 2014-09-11 NOTE — Telephone Encounter (Signed)
Follow up    Pt is working on eliquis.  [facility] was no cheaper and she called the hotline for eliquis and they did not help her with the medication.  Pt will be at work at 12:15 and unavailable.  Please call to discuss another medication that is cheaper.

## 2014-09-11 NOTE — Telephone Encounter (Signed)
Spoke with pt and she states that her Eliquis is going to cost her $230 this month and insurance said starting next month it would be $495. Pt states that she typically pays $124. Pt is willing to switch to Coumadin if it is necessary but would like to stay on Eliquis if possible. Pt wanted to know if there is a copay for CVRR appts as well. Spoke with Glenna at front desk and she said that pt's do pay a copay but it is not as high as when seeing a provider. Will forward this information to Jenean Lindau, LPN to see if there are any options for financial assistance for pt. Pt states if no one can call back before 12:15 today, to call back tomorrow at 1:30pm because she has to work and can't use the phone while working.

## 2014-09-12 NOTE — Telephone Encounter (Signed)
I am copying message to Jenean Lindau, LPN Patient Care Coordinator for her awareness as she is involved in PA for medication

## 2014-09-12 NOTE — Telephone Encounter (Signed)
Follow up      Pt called about the eliquis.  The company is sending a form to the patient and she will bring it to [place].  Pt only has a 24 day supply of rx and it will take approx 10 days to get the form.  Please save her some samples when we get them

## 2014-09-14 NOTE — Telephone Encounter (Signed)
Spoke with patient about this. We can try to apply for Patient Assistance, or possibly a Tier reduction for Eliquis.

## 2014-09-17 NOTE — Telephone Encounter (Signed)
Left message for patient that I have placed Eliquis samples at the front desk for her and to call back with questions or concerns.

## 2014-09-18 ENCOUNTER — Telehealth: Payer: Self-pay | Admitting: Cardiovascular Disease

## 2014-09-18 NOTE — Telephone Encounter (Signed)
New message      For Kayla Hale Thank you for the samples of eliquis.  She really appreciate it

## 2014-09-20 NOTE — Telephone Encounter (Signed)
Will work on getting Patient  Assistance.

## 2014-10-01 ENCOUNTER — Telehealth: Payer: Self-pay

## 2014-10-01 ENCOUNTER — Telehealth: Payer: Self-pay | Admitting: Cardiovascular Disease

## 2014-10-01 NOTE — Telephone Encounter (Signed)
Patient will come in to  pick up forms for PA to obtain Eliquis 5mg .

## 2014-10-01 NOTE — Telephone Encounter (Signed)
New message      Pt has called [employer] 4 times to get financial papers for eliquis.  They still have not called her back.  When they call, she will have them fax the paperwork here.  If they never call, she want to try a different medication

## 2014-10-03 ENCOUNTER — Telehealth: Payer: Self-pay | Admitting: Cardiovascular Disease

## 2014-10-03 NOTE — Telephone Encounter (Signed)
Walk in pt form-sealed Bag-dropped off for Dr.Nahser/Michelle to see gave to [facility].

## 2014-10-04 ENCOUNTER — Other Ambulatory Visit: Payer: Self-pay

## 2014-10-04 MED ORDER — APIXABAN 5 MG PO TABS: 5.0000 mg | ORAL_TABLET | Freq: Two times a day (BID) | ORAL | Status: DC

## 2014-10-18 ENCOUNTER — Telehealth: Payer: Self-pay

## 2014-10-18 NOTE — Telephone Encounter (Signed)
Spoke with patient to assure her everything had been sent [place] [facility]. Also spoke with a rep at [facility], who stated processsing would take 3-5 days.

## 2014-10-23 ENCOUNTER — Telehealth: Payer: Self-pay | Admitting: Cardiovascular Disease

## 2014-10-23 NOTE — Telephone Encounter (Signed)
New message      Talk to Vaughan Basta about getting assistance with her eliquis

## 2014-10-24 NOTE — Telephone Encounter (Signed)
Patient received her letter that she has been accepted into PA Program for her Eliquis. She has expressed gratitude for out help with this.

## 2014-12-24 ENCOUNTER — Encounter: Payer: Self-pay | Admitting: Cardiovascular Disease

## 2014-12-24 ENCOUNTER — Ambulatory Visit (INDEPENDENT_AMBULATORY_CARE_PROVIDER_SITE_OTHER): Payer: Medicare Other | Admitting: Cardiovascular Disease

## 2014-12-24 VITALS — BP 128/82 | HR 72 | Ht 66.0 in | Wt 180.8 lb

## 2014-12-24 DIAGNOSIS — I10 Essential (primary) hypertension: Secondary | ICD-10-CM | POA: Diagnosis not present

## 2014-12-24 DIAGNOSIS — I482 Chronic atrial fibrillation, unspecified: Secondary | ICD-10-CM

## 2014-12-24 NOTE — Patient Instructions (Signed)
Medication Instructions:  Your physician recommends that you continue on your current medications as directed. Please refer to the Current Medication list given to you today.   Labwork: None Ordered   Testing/Procedures: None Ordered   Follow-Up: Your physician wants you to follow-up in: 1 year with Dr. Nahser.  You will receive a reminder letter in the mail two months in advance. If you don't receive a letter, please call our office to schedule the follow-up appointment.      

## 2014-12-24 NOTE — Progress Notes (Signed)
Cardiology Office Note   Date:  12/24/2014   ID:  Kayla Hale, DOB May 29, 1945, MRN [id number]  PCP:  Wenda Low, MD  Cardiologist:   Thayer Headings, MD   Chief Complaint  Patient presents with  . Follow-up    atrial fib    1. Atrial fibrillation - CHADS VASC score = 3 2. Hypertension  History of Present Illness:  Kayla Hale is a 69 yo who is referred for atrial fib. Hx of HTN.  She is basically a symptomatically she was found to have an irregular heart rate on A visit to Urgent care ( which was for a bladder infection) . EKG revealed atrial fibrillation. She does have occasional episodes of palpitations but he seemed to be more related to stress.  Nonsmoker Rare ETOH -  Fhx - father died of MI. ( age 58s), sister has CAd,,   April. 26, 2016: Kayla Hale is a 69 y.o. female who presents for  Atrial fib.  She's done very well. She's been keeping a record of her blood pressure readings and for the most part they are  In the normal  Range. She has not been cardioverted.   She has had atrial fib for many years.    12/24/2014:  She is doing well Is in the donut hole ( mostly from Eliquis )  No CP or dyspnea.   Past Medical History  Diagnosis Date  . Hypertension   . Thyroid disease   . Hyperlipidemia   . Dyslipidemia   . Mild obesity   . Urinary incontinence   . Seasonal allergies   . Shingles 2005    LOWER BACK  . Diverticulosis 2004  . Mild anemia   . Chronic kidney disease     STAGE 2  . Atrial fibrillation ([place])   . Hypothyroidism   . GERD (gastroesophageal reflux disease)   . Gallstones     Past Surgical History  Procedure Laterality Date  . Colonoscopy    . Tubal ligation    . Cholecystectomy N/A 03/16/2014    Procedure: LAPAROSCOPIC CHOLECYSTECTOMY WITH INTRAOPERATIVE CHOLANGIOGRAM;  Surgeon: Rolm Bookbinder, MD;  Location: [facility];  Service: General;  Laterality: N/A;     Current Outpatient Prescriptions  Medication Sig Dispense Refill    . ALPRAZolam (XANAX) 0.5 MG tablet Take 0.5 mg by mouth See admin instructions. Takes 1 tablet by mouth daily    . apixaban (ELIQUIS) 5 MG TABS tablet Take 1 tablet (5 mg total) by mouth 2 (two) times daily. 180 tablet 3  . Calcium Carbonate-Vitamin D (CALCIUM-VITAMIN D) 500-200 MG-UNIT per tablet Take 1 tablet by mouth daily.    . cholecalciferol (VITAMIN D) 1000 UNITS tablet Take 1,000 Units by mouth daily.    Marland Kitchen levothyroxine (SYNTHROID, LEVOTHROID) 75 MCG tablet Take 75 mcg by mouth daily before breakfast.    . Melatonin 200 MCG TABS Take 200 mcg by mouth at bedtime as needed.    . metoprolol (LOPRESSOR) 50 MG tablet Take 1 tablet by mouth two  times daily 180 tablet 3  . Multiple Vitamin (MULTIVITAMIN) tablet Take 1 tablet by mouth daily.    Marland Kitchen omeprazole (PRILOSEC) 20 MG capsule Take 20 mg by mouth daily.    Marland Kitchen oxybutynin (DITROPAN) 5 MG tablet Take 5 mg by mouth See admin instructions. States she takes 2  Tablets by mouth daily    . pravastatin (PRAVACHOL) 20 MG tablet Take 20 mg by mouth daily.    Marland Kitchen oxyCODONE-acetaminophen (PERCOCET/ROXICET) 5-325  MG per tablet Take 1 tablet by mouth every 4 (four) hours as needed for moderate pain. (Patient not taking: Reported on 06/26/2014) 30 tablet 0  . polyethylene glycol (MIRALAX / GLYCOLAX) packet Take 17 g by mouth 2 (two) times daily. (Patient not taking: Reported on 12/24/2014) 14 each 0   No current facility-administered medications for this visit.    Allergies:   Lisinopril and Norvasc    Social History:  The patient  reports that she has never smoked. She has never used smokeless tobacco. She reports that she does not drink alcohol or use illicit drugs.   Family History:  The patient's family history includes CVA in her brother; Diabetes in her brother and sister; Heart attack in her father and sister; Hypertension in her father.    ROS:  Please see the history of present illness.    Review of Systems: Constitutional:  denies fever,  chills, diaphoresis, appetite change and fatigue.  HEENT: denies photophobia, eye pain, redness, hearing loss, ear pain, congestion, sore throat, rhinorrhea, sneezing, neck pain, neck stiffness and tinnitus.  Respiratory: denies SOB, DOE, cough, chest tightness, and wheezing.  Cardiovascular: denies chest pain, palpitations and leg swelling.  Gastrointestinal: denies nausea, vomiting, abdominal pain, diarrhea, constipation, blood in stool.  Genitourinary: denies dysuria, urgency, frequency, hematuria, flank pain and difficulty urinating.  Musculoskeletal: denies  myalgias, back pain, joint swelling, arthralgias and gait problem.   Skin: denies pallor, rash and wound.  Neurological: denies dizziness, seizures, syncope, weakness, light-headedness, numbness and headaches.   Hematological: denies adenopathy, easy bruising, personal or family bleeding history.  Psychiatric/ Behavioral: denies suicidal ideation, mood changes, confusion, nervousness, sleep disturbance and agitation.       All other systems are reviewed and negative.    PHYSICAL EXAM: VS:  BP 128/82 mmHg  Pulse 72  Ht 5\' 6"  (1.676 m)  Wt 180 lb 12.8 oz (82.01 kg)  BMI 29.20 kg/m2  SpO2 97% , BMI Body mass index is 29.2 kg/(m^2). GEN: Well nourished, well developed, in no acute distress HEENT: normal Neck: no JVD, carotid bruits, or masses Cardiac: Irreg. Irreg; no murmurs, rubs, or gallops,no edema  Respiratory:  clear to auscultation bilaterally, normal work of breathing GI: soft, nontender, nondistended, + BS MS: no deformity or atrophy Skin: warm and dry, no rash Neuro:  Strength and sensation are intact Psych: normal   EKG:  EKG is not ordered today. The ekg ordered today demonstrates    Recent Labs: 03/17/2014: ALT 66*; BUN 10; Creatinine, Ser 0.66; Potassium 4.6; Sodium 136 03/18/2014: TSH 1.875 03/19/2014: Hemoglobin 10.4*; Platelets 181    Lipid Panel No results found for: CHOL, TRIG, HDL, CHOLHDL, VLDL,  LDLCALC, LDLDIRECT    Wt Readings from Last 3 Encounters:  12/24/14 180 lb 12.8 oz (82.01 kg)  06/26/14 184 lb 9.6 oz (83.734 kg)  03/26/14 184 lb (83.462 kg)      Other studies Reviewed: Additional studies/ records that were reviewed today include: . Review of the above records demonstrates:    ASSESSMENT AND PLAN:  1. Atrial fibrillation - CHADS VASC score = 3 She is doing well.  Basically asymptomatic.  Continue current meds.  Continue eliquis .  She wants to try to find a cheaper alternative   2. Hypertension - BP is well controlled.  Continue current meds  3. Hyperlipidemia:  Managed by her primary MD     Current medicines are reviewed at length with the patient today.  The patient does not have concerns regarding  medicines.  The following changes have been made:  no change  Labs/ tests ordered today include:  No orders of the defined types were placed in this encounter.     Disposition:   FU with me in 6 months .     Signed, Montana Bryngelson, Wonda Cheng, MD  12/24/2014 8:57 AM    [address] [place], [place], [place]  [address] Phone: [phone]; Fax: [phone]

## 2015-03-08 ENCOUNTER — Other Ambulatory Visit: Payer: Self-pay | Admitting: Cardiovascular Disease

## 2015-03-12 ENCOUNTER — Telehealth: Payer: Self-pay | Admitting: Cardiovascular Disease

## 2015-03-12 NOTE — Telephone Encounter (Signed)
New message     Patient calling changing drug company to [facility] fax # [phone].  Not out of any medication at this time

## 2015-03-15 NOTE — Telephone Encounter (Signed)
Pt needs another alternative for Eliquis.  States her out of pocket expense will be $400 for a 90 day supply. Pt has 2 months of eliquis at home currently but wants to discuss possible switch to coumadin. Needs to know about expense for coumadin clinic and lab draws, please call to discuss, pt aware dr/nurse out of office and will return next week. Pt verbalized understanding.

## 2015-03-15 NOTE — Telephone Encounter (Signed)
Follow up   Pt called states that she changed drug plans and the Eliquis is expensive. Req a call back to discuss something cheaper

## 2015-03-15 NOTE — Telephone Encounter (Signed)
lmtcb

## 2015-03-20 NOTE — Telephone Encounter (Signed)
Left message for patient to call me back. 

## 2015-03-21 ENCOUNTER — Other Ambulatory Visit: Payer: Self-pay

## 2015-03-21 DIAGNOSIS — Z1231 Encounter for screening mammogram for malignant neoplasm of breast: Secondary | ICD-10-CM

## 2015-03-21 NOTE — Telephone Encounter (Signed)
Left message for patient that I am submitting paperwork for tier exception for lower copay for Eliquis.  I advised her I will keep her updated and to call back with questions or concerns.

## 2015-03-21 NOTE — Telephone Encounter (Signed)
Follow Up  Pt called states that she has enough Eliquis for 2 more months and that she is requesting a call back to discuss the alternative. Please call

## 2015-03-29 NOTE — Telephone Encounter (Signed)
Kayla Hale, would you please check into this?

## 2015-04-09 NOTE — Telephone Encounter (Signed)
I have not received anything from Insurance Co for this patient. I thought she was getting Patient Assist from [employer]?

## 2015-04-09 NOTE — Telephone Encounter (Signed)
I spoke with patient and she must spend so much out of pocket before qualifying for assistance. She can't afford monthly co-pay of around $400.

## 2015-04-10 ENCOUNTER — Telehealth: Payer: Self-pay | Admitting: Cardiovascular Disease

## 2015-04-10 NOTE — Telephone Encounter (Signed)
Refill pool, Sharyn Lull will take care of this on 2/9

## 2015-04-10 NOTE — Telephone Encounter (Addendum)
Pt calling re status of Eliquis being covered by [facility] drug insurance ID# [id number] Group [id number] Plan [id number] Customer Service# [phone] Mail Order # [phone] pt has 3 month supply still-pt [phone]-can be reached after 11a tomorrow

## 2015-04-12 NOTE — Telephone Encounter (Signed)
Left message for patient to call back closer to when she will run out of eliquis to discuss help with cost - either meeting deductible or switching agents.

## 2015-04-23 ENCOUNTER — Ambulatory Visit
Admission: RE | Admit: 2015-04-23 | Discharge: 2015-04-23 | Disposition: A | Discharge status: Home / Self Care | Payer: Medicare Other | Source: Ambulatory Visit

## 2015-04-23 DIAGNOSIS — Z1231 Encounter for screening mammogram for malignant neoplasm of breast: Secondary | ICD-10-CM | POA: Diagnosis not present

## 2015-05-23 ENCOUNTER — Telehealth: Payer: Self-pay | Admitting: *Deleted

## 2015-05-23 MED ORDER — RIVAROXABAN 20 MG PO TABS: 20.0000 mg | ORAL_TABLET | Freq: Every day | ORAL | Status: DC

## 2015-05-23 NOTE — Telephone Encounter (Signed)
Spoke with patient and advised that per Dr. Acie Fredrickson we can switch her to Xarelto 20 mg once daily due to the expense of Eliquis.  I advised her to d/c Eliquis once Xarelto is received in the mail and start Xarelto the next day with her evening meal.  She states she has 7 days left of eliquis.  I advised that I will place samples at the front desk for her to pick up next week.  I advised her that our office will contact her for any additional information.  She thanked me for the call.

## 2015-05-23 NOTE — Telephone Encounter (Signed)
Patient called and requested to speak with you about the eliquis. She stated that she has a little more than one week supply left and would like to discuss switching to something more affordable. She can be reached at [phone]. Thanks, MI

## 2015-05-29 ENCOUNTER — Telehealth: Payer: Self-pay | Admitting: Cardiovascular Disease

## 2015-05-29 NOTE — Telephone Encounter (Signed)
Follow up ° ° °Pt returning rn call °

## 2015-05-29 NOTE — Telephone Encounter (Signed)
I dont have any suggestions besides  Eliquis xarelto Coumadin  She has long standing atrial fib and is at risk for cva

## 2015-05-29 NOTE — Telephone Encounter (Signed)
New Message  Pt requested to speak w/ RN- stated he can not afford xarelto- wants to discuss alternatives- needs to know what to do going forward. Please call back and discuss.

## 2015-05-29 NOTE — Telephone Encounter (Signed)
See Dr. Elmarie Shiley note.  Tried to call pt but no answer.  She told me she had 20 days of Xarelto so not sure when she wants to start on Coumadin. Will route to [employer]

## 2015-05-29 NOTE — Telephone Encounter (Signed)
Calling stating she has talked with [facility] and they told her the Xarelto will be about $232 for 90 days and that is about the same as Eliquis.  She states she couldn't afford to pay that amout every 3 mo.  States they told her Warfarin would be no co pay for 90 day supply.  Kayla Hale had explained to her that she would have to come weekly or every other week for adjustment of her coumadin.  Advised that she may have to pay a co- pay with every visit so that would add to expenses.  She will call [employer] back to see if she does have to pay co pay on each visit.  She will reevaluate her expenses.  States she has about 20 days of Xarelto samples that Kayla Hale had given her.  Advised will forward to Dr. Acie Fredrickson and Kayla Hale to make them aware that the Xarelto was expensive also but that she will let us know whether she will want to switch to Warfarin depending on what [facility] says regarding co pay for every visit.

## 2015-05-29 NOTE — Telephone Encounter (Signed)
LMTCB

## 2015-05-29 NOTE — Telephone Encounter (Signed)
Ms. Salamat calling back.  She wants to wait until she finishes the Xarelto.  Advised would have to find out what dose of coumadin Dr. Acie Fredrickson wants to start her on and make her an appointment with the coumadin clinic.  She states days available in April are 4/14 after 2, or 17th, 19th or 20th. Advised Sharyn Lull will call her with appointment and dose of Coumadin. Will need Rx sent into [employer].

## 2015-05-29 NOTE — Telephone Encounter (Signed)
Calling back to let us know that the [facility] is for medication only.  [employer] called her back and told her the Xarelto would be $533 for 90 day supply.  States she can't afford that a month.  States she has [facility] and a supplement which she probably will not have a co pay to come for the coumadin checks.  Will forward to Dr. Acie Fredrickson and Duaine Dredge.

## 2015-05-29 NOTE — Telephone Encounter (Signed)
Follow up   Pt called states that her lab visits will be covered be insurance if she has to go on a different medication

## 2015-05-31 NOTE — Telephone Encounter (Signed)
Left message for patient to call back.  After discussion with Tula Nakayama, RN in CVRR and Dr. Acie Fredrickson, the plan is to start the patient on 5 mg of warfarin on Sunday April 16 in conjunction with her Xarelto 20 mg.  She will continue this on Sunday, Monday, and Tuesday.  On Wednesday she will d/c Xarelto and take warfarin 5 mg.  She is scheduled for an appointment in CVRR on Friday 4/21 at 10:30.

## 2015-06-03 MED ORDER — WARFARIN SODIUM 5 MG PO TABS: 5.0000 mg | ORAL_TABLET | Freq: Every day | ORAL | Status: DC

## 2015-06-03 NOTE — Telephone Encounter (Signed)
Reviewed instructions with patient on starting warfarin and d/c'ing xarelto.  She wrote down the instructions I gave her.  I advised that a 30 day supply of warfarin was sent to [facility].  She repeated instructions back to me and verbalized understanding and agreement.  She is aware of CVRR appointment on Friday 4/21.  I advised her to call back with questions or concerns prior to that visit.  She thanked me for the call.

## 2015-06-03 NOTE — Telephone Encounter (Signed)
Follow up ° ° ° ° ° °Returning a call to the nurse °

## 2015-06-24 ENCOUNTER — Telehealth: Payer: Self-pay | Admitting: Cardiovascular Disease

## 2015-06-24 ENCOUNTER — Ambulatory Visit (INDEPENDENT_AMBULATORY_CARE_PROVIDER_SITE_OTHER): Payer: Medicare Other | Admitting: Pharmacist

## 2015-06-24 DIAGNOSIS — I482 Chronic atrial fibrillation, unspecified: Secondary | ICD-10-CM

## 2015-06-24 DIAGNOSIS — Z7901 Long term (current) use of anticoagulants: Secondary | ICD-10-CM

## 2015-06-24 DIAGNOSIS — Z5181 Encounter for therapeutic drug level monitoring: Secondary | ICD-10-CM

## 2015-06-24 LAB — POCT INR: INR: 1.4

## 2015-06-24 NOTE — Telephone Encounter (Addendum)
Kayla Hale is calling to check and see if her medication was sent to her pharmacy , because she has not received it in the mail as of yet. Thanks

## 2015-06-24 NOTE — Telephone Encounter (Signed)
LVM to call back to discuss medication being sent to pharmacy (mail)

## 2015-06-24 NOTE — Telephone Encounter (Deleted)
Kayla Hale

## 2015-07-02 ENCOUNTER — Ambulatory Visit (INDEPENDENT_AMBULATORY_CARE_PROVIDER_SITE_OTHER): Payer: Medicare Other | Admitting: *Deleted

## 2015-07-02 DIAGNOSIS — I482 Chronic atrial fibrillation, unspecified: Secondary | ICD-10-CM

## 2015-07-02 DIAGNOSIS — Z5181 Encounter for therapeutic drug level monitoring: Secondary | ICD-10-CM | POA: Diagnosis not present

## 2015-07-02 DIAGNOSIS — Z7901 Long term (current) use of anticoagulants: Secondary | ICD-10-CM | POA: Diagnosis not present

## 2015-07-02 LAB — POCT INR: INR: 2.2

## 2015-07-02 MED ORDER — WARFARIN SODIUM 5 MG PO TABS: 5.0000 mg | ORAL_TABLET | ORAL | Status: DC

## 2015-07-09 ENCOUNTER — Ambulatory Visit (INDEPENDENT_AMBULATORY_CARE_PROVIDER_SITE_OTHER): Payer: Medicare Other | Admitting: *Deleted

## 2015-07-09 DIAGNOSIS — Z5181 Encounter for therapeutic drug level monitoring: Secondary | ICD-10-CM | POA: Diagnosis not present

## 2015-07-09 DIAGNOSIS — Z7901 Long term (current) use of anticoagulants: Secondary | ICD-10-CM

## 2015-07-09 DIAGNOSIS — I482 Chronic atrial fibrillation, unspecified: Secondary | ICD-10-CM

## 2015-07-09 LAB — POCT INR: INR: 2.5

## 2015-07-16 ENCOUNTER — Ambulatory Visit (INDEPENDENT_AMBULATORY_CARE_PROVIDER_SITE_OTHER): Payer: Medicare Other | Admitting: *Deleted

## 2015-07-16 DIAGNOSIS — I482 Chronic atrial fibrillation, unspecified: Secondary | ICD-10-CM

## 2015-07-16 DIAGNOSIS — Z5181 Encounter for therapeutic drug level monitoring: Secondary | ICD-10-CM

## 2015-07-16 DIAGNOSIS — Z7901 Long term (current) use of anticoagulants: Secondary | ICD-10-CM | POA: Diagnosis not present

## 2015-07-16 LAB — POCT INR: INR: 2.3

## 2015-07-30 ENCOUNTER — Ambulatory Visit (INDEPENDENT_AMBULATORY_CARE_PROVIDER_SITE_OTHER): Payer: Medicare Other | Admitting: *Deleted

## 2015-07-30 DIAGNOSIS — I482 Chronic atrial fibrillation, unspecified: Secondary | ICD-10-CM

## 2015-07-30 DIAGNOSIS — Z7901 Long term (current) use of anticoagulants: Secondary | ICD-10-CM

## 2015-07-30 DIAGNOSIS — Z5181 Encounter for therapeutic drug level monitoring: Secondary | ICD-10-CM

## 2015-07-30 LAB — POCT INR: INR: 1.9

## 2015-08-20 ENCOUNTER — Ambulatory Visit (INDEPENDENT_AMBULATORY_CARE_PROVIDER_SITE_OTHER): Payer: Medicare Other | Admitting: *Deleted

## 2015-08-20 DIAGNOSIS — Z5181 Encounter for therapeutic drug level monitoring: Secondary | ICD-10-CM

## 2015-08-20 DIAGNOSIS — I482 Chronic atrial fibrillation, unspecified: Secondary | ICD-10-CM

## 2015-08-20 DIAGNOSIS — Z7901 Long term (current) use of anticoagulants: Secondary | ICD-10-CM

## 2015-08-20 LAB — POCT INR: INR: 2.2

## 2015-09-04 ENCOUNTER — Other Ambulatory Visit: Payer: Self-pay | Admitting: Cardiovascular Disease

## 2015-09-16 DIAGNOSIS — I4891 Unspecified atrial fibrillation: Secondary | ICD-10-CM | POA: Diagnosis not present

## 2015-09-16 DIAGNOSIS — K219 Gastro-esophageal reflux disease without esophagitis: Secondary | ICD-10-CM | POA: Diagnosis not present

## 2015-09-16 DIAGNOSIS — N183 Chronic kidney disease, stage 3 (moderate): Secondary | ICD-10-CM | POA: Diagnosis not present

## 2015-09-16 DIAGNOSIS — I10 Essential (primary) hypertension: Secondary | ICD-10-CM | POA: Diagnosis not present

## 2015-09-16 DIAGNOSIS — E039 Hypothyroidism, unspecified: Secondary | ICD-10-CM | POA: Diagnosis not present

## 2015-09-16 DIAGNOSIS — E7800 Pure hypercholesterolemia, unspecified: Secondary | ICD-10-CM | POA: Diagnosis not present

## 2015-09-17 ENCOUNTER — Ambulatory Visit (INDEPENDENT_AMBULATORY_CARE_PROVIDER_SITE_OTHER): Payer: Medicare Other | Admitting: *Deleted

## 2015-09-17 DIAGNOSIS — Z7901 Long term (current) use of anticoagulants: Secondary | ICD-10-CM | POA: Diagnosis not present

## 2015-09-17 DIAGNOSIS — I482 Chronic atrial fibrillation, unspecified: Secondary | ICD-10-CM

## 2015-09-17 DIAGNOSIS — Z5181 Encounter for therapeutic drug level monitoring: Secondary | ICD-10-CM

## 2015-09-17 LAB — POCT INR: INR: 2

## 2015-10-07 DIAGNOSIS — H1032 Unspecified acute conjunctivitis, left eye: Secondary | ICD-10-CM | POA: Diagnosis not present

## 2015-10-15 ENCOUNTER — Ambulatory Visit (INDEPENDENT_AMBULATORY_CARE_PROVIDER_SITE_OTHER): Payer: Medicare Other | Admitting: Pharmacist

## 2015-10-15 DIAGNOSIS — I482 Chronic atrial fibrillation, unspecified: Secondary | ICD-10-CM

## 2015-10-15 DIAGNOSIS — Z5181 Encounter for therapeutic drug level monitoring: Secondary | ICD-10-CM

## 2015-10-15 DIAGNOSIS — Z7901 Long term (current) use of anticoagulants: Secondary | ICD-10-CM | POA: Diagnosis not present

## 2015-10-15 LAB — POCT INR: INR: 1.9

## 2015-11-11 ENCOUNTER — Other Ambulatory Visit: Payer: Self-pay | Admitting: Cardiovascular Disease

## 2015-11-12 ENCOUNTER — Ambulatory Visit (INDEPENDENT_AMBULATORY_CARE_PROVIDER_SITE_OTHER): Payer: Medicare Other | Admitting: *Deleted

## 2015-11-12 DIAGNOSIS — Z7901 Long term (current) use of anticoagulants: Secondary | ICD-10-CM

## 2015-11-12 DIAGNOSIS — Z5181 Encounter for therapeutic drug level monitoring: Secondary | ICD-10-CM | POA: Diagnosis not present

## 2015-11-12 DIAGNOSIS — I482 Chronic atrial fibrillation, unspecified: Secondary | ICD-10-CM

## 2015-11-12 LAB — POCT INR: INR: 2.3

## 2015-11-26 DIAGNOSIS — Z23 Encounter for immunization: Secondary | ICD-10-CM | POA: Diagnosis not present

## 2015-12-24 ENCOUNTER — Ambulatory Visit: Payer: Medicare Other | Admitting: Cardiovascular Disease

## 2015-12-27 DIAGNOSIS — D2272 Melanocytic nevi of left lower limb, including hip: Secondary | ICD-10-CM | POA: Diagnosis not present

## 2015-12-27 DIAGNOSIS — D225 Melanocytic nevi of trunk: Secondary | ICD-10-CM | POA: Diagnosis not present

## 2015-12-27 DIAGNOSIS — D2271 Melanocytic nevi of right lower limb, including hip: Secondary | ICD-10-CM | POA: Diagnosis not present

## 2015-12-27 DIAGNOSIS — D1801 Hemangioma of skin and subcutaneous tissue: Secondary | ICD-10-CM | POA: Diagnosis not present

## 2015-12-27 DIAGNOSIS — L814 Other melanin hyperpigmentation: Secondary | ICD-10-CM | POA: Diagnosis not present

## 2015-12-27 DIAGNOSIS — D692 Other nonthrombocytopenic purpura: Secondary | ICD-10-CM | POA: Diagnosis not present

## 2015-12-27 DIAGNOSIS — L821 Other seborrheic keratosis: Secondary | ICD-10-CM | POA: Diagnosis not present

## 2015-12-31 ENCOUNTER — Ambulatory Visit (INDEPENDENT_AMBULATORY_CARE_PROVIDER_SITE_OTHER): Payer: Medicare Other | Admitting: Cardiovascular Disease

## 2015-12-31 ENCOUNTER — Ambulatory Visit (INDEPENDENT_AMBULATORY_CARE_PROVIDER_SITE_OTHER): Payer: Medicare Other | Admitting: Pharmacist Clinician (PhC)/ Clinical Pharmacy Specialist

## 2015-12-31 ENCOUNTER — Encounter (INDEPENDENT_AMBULATORY_CARE_PROVIDER_SITE_OTHER): Payer: Self-pay

## 2015-12-31 ENCOUNTER — Encounter: Payer: Self-pay | Admitting: Cardiovascular Disease

## 2015-12-31 VITALS — BP 140/80 | HR 69 | Ht 66.0 in | Wt 194.0 lb

## 2015-12-31 DIAGNOSIS — Z5181 Encounter for therapeutic drug level monitoring: Secondary | ICD-10-CM

## 2015-12-31 DIAGNOSIS — I482 Chronic atrial fibrillation, unspecified: Secondary | ICD-10-CM

## 2015-12-31 DIAGNOSIS — Z7901 Long term (current) use of anticoagulants: Secondary | ICD-10-CM

## 2015-12-31 DIAGNOSIS — I10 Essential (primary) hypertension: Secondary | ICD-10-CM

## 2015-12-31 LAB — POCT INR: INR: 1.9

## 2015-12-31 MED ORDER — CARVEDILOL 25 MG PO TABS: 25.0000 mg | ORAL_TABLET | Freq: Two times a day (BID) | ORAL | 3 refills | Status: DC

## 2015-12-31 NOTE — Patient Instructions (Addendum)
Medication Instructions:  STOP Metoprolol - when you finish your prescription START Carvedilol (Coreg) 25 mg twice daily when you finish Metoprolol   Labwork: None Ordered   Testing/Procedures: None Ordered   Follow-Up: Your physician recommends that you schedule a follow-up appointment in: 6 weeks for Nurse visit/BP check - Sharyn Lull will call you with an appointment  Your physician wants you to follow-up in: 6 months with Dr. Acie Fredrickson.  You will receive a reminder letter in the mail two months in advance. If you don't receive a letter, please call our office to schedule the follow-up appointment.   If you need a refill on your cardiac medications before your next appointment, please call your pharmacy.   Thank you for choosing [facility]! Christen Bame, RN [phone]

## 2015-12-31 NOTE — Progress Notes (Signed)
Cardiology Office Note   Date:  12/31/2015   ID:  Kayla Hale, DOB 12-Aug-1945, MRN [id number]  PCP:  Wenda Low, MD  Cardiologist:   Mertie Moores, MD   Chief Complaint  Patient presents with  . Follow-up    atrial fib   1. Atrial fibrillation - CHADS VASC score = 3 2. Hypertension    Kayla Hale is a 70 yo who is referred for atrial fib. Hx of HTN.  She is basically a symptomatically she was found to have an irregular heart rate on A visit to Urgent care ( which was for a bladder infection) . EKG revealed atrial fibrillation. She does have occasional episodes of palpitations but he seemed to be more related to stress.  Nonsmoker Rare ETOH -  Fhx - father died of MI. ( age 56s), sister has CAd,,   April. 26, 2016: Kayla Hale is a 70 y.o. female who presents for  Atrial fib.  She's done very well. She's been keeping a record of her blood pressure readings and for the most part they are  In the normal  Range. She has not been cardioverted.   She has had atrial fib for many years.    12/24/2014:  She is doing well Is in the donut hole ( mostly from Eliquis )  No CP or dyspnea.  Oct. 31, 2017:  Kayla Hale is seen Today for follow-up of her hypertension and atrial fibrillation. Is not working anymore at [employer] ( previously worked at [employer] )  Exercising - walks every day for 30 minutes.     Past Medical History:  Diagnosis Date  . Atrial fibrillation ([place])   . Chronic kidney disease    STAGE 2  . Diverticulosis 2004  . Dyslipidemia   . Gallstones   . GERD (gastroesophageal reflux disease)   . Hyperlipidemia   . Hypertension   . Hypothyroidism   . Mild anemia   . Mild obesity   . Seasonal allergies   . Shingles 2005   LOWER BACK  . Thyroid disease   . Urinary incontinence     Past Surgical History:  Procedure Laterality Date  . CHOLECYSTECTOMY N/A 03/16/2014   Procedure: LAPAROSCOPIC CHOLECYSTECTOMY WITH INTRAOPERATIVE CHOLANGIOGRAM;  Surgeon:  Rolm Bookbinder, MD;  Location: [facility];  Service: General;  Laterality: N/A;  . COLONOSCOPY    . TUBAL LIGATION       Current Outpatient Prescriptions  Medication Sig Dispense Refill  . ALPRAZolam (XANAX) 0.5 MG tablet Take 0.5 mg by mouth See admin instructions. Takes 1 tablet by mouth daily    . Calcium Carbonate-Vitamin D (CALCIUM-VITAMIN D) 500-200 MG-UNIT per tablet Take 1 tablet by mouth daily.    . cholecalciferol (VITAMIN D) 1000 UNITS tablet Take 1,000 Units by mouth daily.    Marland Kitchen FLUZONE HIGH-DOSE 0.5 ML SUSY Inject as directed once.  0  . levothyroxine (SYNTHROID, LEVOTHROID) 75 MCG tablet Take 75 mcg by mouth daily before breakfast.    . Melatonin 200 MCG TABS Take 200 mcg by mouth at bedtime as needed.    . metoprolol (LOPRESSOR) 50 MG tablet Take 1 tablet by mouth two  times daily 180 tablet 3  . Multiple Vitamin (MULTIVITAMIN) tablet Take 1 tablet by mouth daily.    Marland Kitchen omeprazole (PRILOSEC) 20 MG capsule Take 20 mg by mouth daily.    Marland Kitchen oxybutynin (DITROPAN) 5 MG tablet Take 5 mg by mouth See admin instructions. States she takes 2  Tablets by mouth daily    .  pravastatin (PRAVACHOL) 20 MG tablet Take 20 mg by mouth daily.    Marland Kitchen warfarin (COUMADIN) 5 MG tablet TAKE AS DIRECTED BY COUMADIN CLINIC. 120 tablet 0   No current facility-administered medications for this visit.     Allergies:   Lisinopril and Norvasc [amlodipine besylate]    Social History:  The patient  reports that she has never smoked. She has never used smokeless tobacco. She reports that she does not drink alcohol or use drugs.   Family History:  The patient's family history includes CVA in her brother; Diabetes in her brother and sister; Heart attack in her father and sister; Hypertension in her father.    ROS:  Please see the history of present illness.    Review of Systems: Constitutional:  denies fever, chills, diaphoresis, appetite change and fatigue.  HEENT: denies photophobia, eye pain, redness,  hearing loss, ear pain, congestion, sore throat, rhinorrhea, sneezing, neck pain, neck stiffness and tinnitus.  Respiratory: denies SOB, DOE, cough, chest tightness, and wheezing.  Cardiovascular: denies chest pain, palpitations and leg swelling.  Gastrointestinal: denies nausea, vomiting, abdominal pain, diarrhea, constipation, blood in stool.  Genitourinary: denies dysuria, urgency, frequency, hematuria, flank pain and difficulty urinating.  Musculoskeletal: denies  myalgias, back pain, joint swelling, arthralgias and gait problem.   Skin: denies pallor, rash and wound.  Neurological: denies dizziness, seizures, syncope, weakness, light-headedness, numbness and headaches.   Hematological: denies adenopathy, easy bruising, personal or family bleeding history.  Psychiatric/ Behavioral: denies suicidal ideation, mood changes, confusion, nervousness, sleep disturbance and agitation.       All other systems are reviewed and negative.    PHYSICAL EXAM: VS:  BP 140/80 (BP Location: Right Arm, Cuff Size: Large)   Pulse 69   Ht 5\' 6"  (1.676 m)   Wt 194 lb (88 kg)   BMI 31.31 kg/m  , BMI Body mass index is 31.31 kg/m. GEN: Well nourished, well developed, in no acute distress  HEENT: normal  Neck: no JVD, carotid bruits, or masses Cardiac: Irreg. Irreg; no murmurs, rubs, or gallops,no edema  Respiratory:  clear to auscultation bilaterally, normal work of breathing GI: soft, nontender, nondistended, + BS MS: no deformity or atrophy  Skin: warm and dry, no rash Neuro:  Strength and sensation are intact Psych: normal   EKG:  EKG is ordered today. The ekg ordered today demonstrates  Atrial fib with Vent rate of 69.  No ST or T wave changes.    Recent Labs: No results found for requested labs within last 8760 hours.    Lipid Panel No results found for: CHOL, TRIG, HDL, CHOLHDL, VLDL, LDLCALC, LDLDIRECT    Wt Readings from Last 3 Encounters:  12/31/15 194 lb (88 kg)  12/24/14 180  lb 12.8 oz (82 kg)  06/26/14 184 lb 9.6 oz (83.7 kg)      Other studies Reviewed: Additional studies/ records that were reviewed today include: . Review of the above records demonstrates:    ASSESSMENT AND PLAN:  1. Atrial fibrillation - CHADS VASC score = 3 She is doing well.  Basically asymptomatic.  Continue current meds.  Continue coumadin ,  INR today .   Last INR was 2.3   2. Hypertension -  Will change metoprolol to Coreg 25 mg PO BID . Nurse visit BP check in 6 weeks ( she has a month of metoprolol left)   3. Hyperlipidemia:  Managed by her primary MD     Current medicines are reviewed at length with  the patient today.  The patient does not have concerns regarding medicines.  The following changes have been made:  no change  Labs/ tests ordered today include:  No orders of the defined types were placed in this encounter.    Disposition:   FU with me in 6 months .     Signed, Mertie Moores, MD  12/31/2015 8:37 AM    [facility] [place], [place], [place]  [address] Phone: [phone]; Fax: [phone]

## 2016-01-13 ENCOUNTER — Other Ambulatory Visit: Payer: Self-pay | Admitting: Cardiovascular Disease

## 2016-01-14 ENCOUNTER — Ambulatory Visit (INDEPENDENT_AMBULATORY_CARE_PROVIDER_SITE_OTHER): Payer: Medicare Other | Admitting: *Deleted

## 2016-01-14 DIAGNOSIS — M170 Bilateral primary osteoarthritis of knee: Secondary | ICD-10-CM | POA: Diagnosis not present

## 2016-01-14 DIAGNOSIS — I482 Chronic atrial fibrillation, unspecified: Secondary | ICD-10-CM

## 2016-01-14 DIAGNOSIS — Z5181 Encounter for therapeutic drug level monitoring: Secondary | ICD-10-CM

## 2016-01-14 DIAGNOSIS — Z7901 Long term (current) use of anticoagulants: Secondary | ICD-10-CM | POA: Diagnosis not present

## 2016-01-14 DIAGNOSIS — M25562 Pain in left knee: Secondary | ICD-10-CM | POA: Diagnosis not present

## 2016-01-14 DIAGNOSIS — M25561 Pain in right knee: Secondary | ICD-10-CM | POA: Diagnosis not present

## 2016-01-14 LAB — POCT INR: INR: 2.3

## 2016-01-20 DIAGNOSIS — M25561 Pain in right knee: Secondary | ICD-10-CM | POA: Diagnosis not present

## 2016-01-20 DIAGNOSIS — M1711 Unilateral primary osteoarthritis, right knee: Secondary | ICD-10-CM | POA: Diagnosis not present

## 2016-01-21 DIAGNOSIS — M25562 Pain in left knee: Secondary | ICD-10-CM | POA: Diagnosis not present

## 2016-01-21 DIAGNOSIS — M1712 Unilateral primary osteoarthritis, left knee: Secondary | ICD-10-CM | POA: Diagnosis not present

## 2016-01-27 DIAGNOSIS — M1711 Unilateral primary osteoarthritis, right knee: Secondary | ICD-10-CM | POA: Diagnosis not present

## 2016-01-27 DIAGNOSIS — M25561 Pain in right knee: Secondary | ICD-10-CM | POA: Diagnosis not present

## 2016-01-28 DIAGNOSIS — M25562 Pain in left knee: Secondary | ICD-10-CM | POA: Diagnosis not present

## 2016-01-28 DIAGNOSIS — M1712 Unilateral primary osteoarthritis, left knee: Secondary | ICD-10-CM | POA: Diagnosis not present

## 2016-02-03 DIAGNOSIS — M25561 Pain in right knee: Secondary | ICD-10-CM | POA: Diagnosis not present

## 2016-02-03 DIAGNOSIS — M1711 Unilateral primary osteoarthritis, right knee: Secondary | ICD-10-CM | POA: Diagnosis not present

## 2016-02-04 ENCOUNTER — Ambulatory Visit (INDEPENDENT_AMBULATORY_CARE_PROVIDER_SITE_OTHER): Payer: Medicare Other | Admitting: *Deleted

## 2016-02-04 DIAGNOSIS — H2513 Age-related nuclear cataract, bilateral: Secondary | ICD-10-CM | POA: Diagnosis not present

## 2016-02-04 DIAGNOSIS — I482 Chronic atrial fibrillation, unspecified: Secondary | ICD-10-CM

## 2016-02-04 DIAGNOSIS — Z5181 Encounter for therapeutic drug level monitoring: Secondary | ICD-10-CM

## 2016-02-04 DIAGNOSIS — M25562 Pain in left knee: Secondary | ICD-10-CM | POA: Diagnosis not present

## 2016-02-04 DIAGNOSIS — M1712 Unilateral primary osteoarthritis, left knee: Secondary | ICD-10-CM | POA: Diagnosis not present

## 2016-02-04 DIAGNOSIS — H52223 Regular astigmatism, bilateral: Secondary | ICD-10-CM | POA: Diagnosis not present

## 2016-02-04 DIAGNOSIS — Z7901 Long term (current) use of anticoagulants: Secondary | ICD-10-CM

## 2016-02-04 LAB — POCT INR: INR: 2.5

## 2016-02-10 DIAGNOSIS — M170 Bilateral primary osteoarthritis of knee: Secondary | ICD-10-CM | POA: Diagnosis not present

## 2016-02-10 DIAGNOSIS — M25561 Pain in right knee: Secondary | ICD-10-CM | POA: Diagnosis not present

## 2016-02-10 DIAGNOSIS — M25562 Pain in left knee: Secondary | ICD-10-CM | POA: Diagnosis not present

## 2016-02-17 DIAGNOSIS — I4891 Unspecified atrial fibrillation: Secondary | ICD-10-CM | POA: Diagnosis not present

## 2016-02-17 DIAGNOSIS — E7800 Pure hypercholesterolemia, unspecified: Secondary | ICD-10-CM | POA: Diagnosis not present

## 2016-02-17 DIAGNOSIS — I10 Essential (primary) hypertension: Secondary | ICD-10-CM | POA: Diagnosis not present

## 2016-02-17 DIAGNOSIS — I482 Chronic atrial fibrillation: Secondary | ICD-10-CM | POA: Diagnosis not present

## 2016-02-17 DIAGNOSIS — K219 Gastro-esophageal reflux disease without esophagitis: Secondary | ICD-10-CM | POA: Diagnosis not present

## 2016-02-17 DIAGNOSIS — E039 Hypothyroidism, unspecified: Secondary | ICD-10-CM | POA: Diagnosis not present

## 2016-02-17 DIAGNOSIS — R7309 Other abnormal glucose: Secondary | ICD-10-CM | POA: Diagnosis not present

## 2016-02-17 DIAGNOSIS — Z1389 Encounter for screening for other disorder: Secondary | ICD-10-CM | POA: Diagnosis not present

## 2016-02-17 DIAGNOSIS — R32 Unspecified urinary incontinence: Secondary | ICD-10-CM | POA: Diagnosis not present

## 2016-02-17 DIAGNOSIS — Z1159 Encounter for screening for other viral diseases: Secondary | ICD-10-CM | POA: Diagnosis not present

## 2016-02-17 DIAGNOSIS — N183 Chronic kidney disease, stage 3 (moderate): Secondary | ICD-10-CM | POA: Diagnosis not present

## 2016-02-17 DIAGNOSIS — Z0000 Encounter for general adult medical examination without abnormal findings: Secondary | ICD-10-CM | POA: Diagnosis not present

## 2016-03-03 ENCOUNTER — Ambulatory Visit (INDEPENDENT_AMBULATORY_CARE_PROVIDER_SITE_OTHER): Payer: Medicare Other

## 2016-03-03 DIAGNOSIS — Z7901 Long term (current) use of anticoagulants: Secondary | ICD-10-CM | POA: Diagnosis not present

## 2016-03-03 DIAGNOSIS — I482 Chronic atrial fibrillation, unspecified: Secondary | ICD-10-CM

## 2016-03-03 DIAGNOSIS — Z5181 Encounter for therapeutic drug level monitoring: Secondary | ICD-10-CM

## 2016-03-03 LAB — POCT INR: INR: 3.2

## 2016-03-18 ENCOUNTER — Other Ambulatory Visit: Payer: Self-pay | Admitting: Cardiovascular Disease

## 2016-03-24 ENCOUNTER — Ambulatory Visit (INDEPENDENT_AMBULATORY_CARE_PROVIDER_SITE_OTHER): Payer: Medicare Other | Admitting: *Deleted

## 2016-03-24 DIAGNOSIS — Z7901 Long term (current) use of anticoagulants: Secondary | ICD-10-CM | POA: Diagnosis not present

## 2016-03-24 DIAGNOSIS — Z5181 Encounter for therapeutic drug level monitoring: Secondary | ICD-10-CM

## 2016-03-24 DIAGNOSIS — I482 Chronic atrial fibrillation, unspecified: Secondary | ICD-10-CM

## 2016-03-24 LAB — POCT INR: INR: 1.9

## 2016-04-07 ENCOUNTER — Other Ambulatory Visit: Payer: Self-pay | Admitting: Internal Medicine

## 2016-04-07 DIAGNOSIS — Z1231 Encounter for screening mammogram for malignant neoplasm of breast: Secondary | ICD-10-CM

## 2016-04-16 ENCOUNTER — Ambulatory Visit (INDEPENDENT_AMBULATORY_CARE_PROVIDER_SITE_OTHER): Payer: Medicare Other | Admitting: *Deleted

## 2016-04-16 ENCOUNTER — Encounter (INDEPENDENT_AMBULATORY_CARE_PROVIDER_SITE_OTHER): Payer: Self-pay

## 2016-04-16 DIAGNOSIS — I482 Chronic atrial fibrillation, unspecified: Secondary | ICD-10-CM

## 2016-04-16 DIAGNOSIS — Z5181 Encounter for therapeutic drug level monitoring: Secondary | ICD-10-CM

## 2016-04-16 DIAGNOSIS — Z7901 Long term (current) use of anticoagulants: Secondary | ICD-10-CM | POA: Diagnosis not present

## 2016-04-16 LAB — POCT INR: INR: 3.3

## 2016-04-27 ENCOUNTER — Ambulatory Visit
Admission: RE | Admit: 2016-04-27 | Discharge: 2016-04-27 | Disposition: A | Discharge status: Home / Self Care | Payer: Medicare Other | Source: Ambulatory Visit | Attending: Internal Medicine | Admitting: Internal Medicine

## 2016-04-27 DIAGNOSIS — Z1231 Encounter for screening mammogram for malignant neoplasm of breast: Secondary | ICD-10-CM | POA: Diagnosis not present

## 2016-05-04 ENCOUNTER — Telehealth: Payer: Self-pay | Admitting: Cardiovascular Disease

## 2016-05-04 NOTE — Telephone Encounter (Signed)
New Message:   Pt says she have been taking her blood pressure and it  is running high.Pt is coming on Thursday for her Coumadin, shewants to know if she can get a nurse to check her blood pressure?

## 2016-05-04 NOTE — Telephone Encounter (Signed)
Spoke with patient who states her BP monitor has been reading her systolic BP in the [id number] mmHg range. She states she has a wrist cuff and changed the batteries to see if readings improved. She asks if she can get her BP checked when she comes in for CVRR appointment on Thursday. I scheduled her for nurse visit/BP check on 3/8 and advised her to bring her cuff for comparison. She verbalized understanding and agreement and thanked me for the call.

## 2016-05-07 ENCOUNTER — Ambulatory Visit (INDEPENDENT_AMBULATORY_CARE_PROVIDER_SITE_OTHER): Payer: Medicare Other | Admitting: *Deleted

## 2016-05-07 ENCOUNTER — Ambulatory Visit (INDEPENDENT_AMBULATORY_CARE_PROVIDER_SITE_OTHER): Payer: Medicare Other | Admitting: Nurse Practitioner

## 2016-05-07 VITALS — BP 124/70 | HR 74 | Ht 66.0 in | Wt 202.8 lb

## 2016-05-07 DIAGNOSIS — Z0131 Encounter for examination of blood pressure with abnormal findings: Secondary | ICD-10-CM | POA: Diagnosis not present

## 2016-05-07 DIAGNOSIS — Z7901 Long term (current) use of anticoagulants: Secondary | ICD-10-CM

## 2016-05-07 DIAGNOSIS — I482 Chronic atrial fibrillation, unspecified: Secondary | ICD-10-CM

## 2016-05-07 DIAGNOSIS — Z5181 Encounter for therapeutic drug level monitoring: Secondary | ICD-10-CM

## 2016-05-07 LAB — POCT INR: INR: 2.5

## 2016-05-07 NOTE — Patient Instructions (Signed)
Medication Instructions:  Your physician recommends that you continue on your current medications as directed. Please refer to the Current Medication list given to you today.   Labwork: None Ordered   Testing/Procedures: None Ordered   Follow-Up: Keep your appointment with Dr. Acie Fredrickson in April  Buy a blood pressure cuff that fits on your upper arm.  It is best if it has the option to plug in or use batteries. Make sure you put fresh batteries in about every 6 months.  If you need a refill on your cardiac medications before your next appointment, please call your pharmacy.   Thank you for choosing [facility]! Christen Bame, RN [phone]

## 2016-05-07 NOTE — Progress Notes (Signed)
1.) Reason for visit: BP check  2.) Name of MD requesting visit: Dr. Elmarie Shiley patient requesting BP check due to high readings from home BP cuff  3.) H&P: Patient request BP check with nurse to compare to home monitor. She takes carvedilol 25 mg twice daily.   4.) ROS related to problem: Patient denies complaints. States home BP cuff is getting high readings. Patient has a wrist cuff and I advised her to get an upper arm cuff that has battery power and electrical outlet option. I advised her that the likely cause for the high readings at home are her a fib and the type of cuff.   5.) Assessment and plan per MD: Continue current medication and keep your f/u appointment with Dr. Acie Fredrickson in April

## 2016-05-14 DIAGNOSIS — M25561 Pain in right knee: Secondary | ICD-10-CM | POA: Diagnosis not present

## 2016-05-14 DIAGNOSIS — M25562 Pain in left knee: Secondary | ICD-10-CM | POA: Diagnosis not present

## 2016-05-14 DIAGNOSIS — M170 Bilateral primary osteoarthritis of knee: Secondary | ICD-10-CM | POA: Diagnosis not present

## 2016-06-04 ENCOUNTER — Ambulatory Visit (INDEPENDENT_AMBULATORY_CARE_PROVIDER_SITE_OTHER): Payer: Medicare Other | Admitting: *Deleted

## 2016-06-04 DIAGNOSIS — I482 Chronic atrial fibrillation, unspecified: Secondary | ICD-10-CM

## 2016-06-04 DIAGNOSIS — Z7901 Long term (current) use of anticoagulants: Secondary | ICD-10-CM | POA: Diagnosis not present

## 2016-06-04 DIAGNOSIS — Z5181 Encounter for therapeutic drug level monitoring: Secondary | ICD-10-CM | POA: Diagnosis not present

## 2016-06-04 LAB — POCT INR: INR: 2.6

## 2016-06-05 ENCOUNTER — Encounter: Payer: Self-pay | Admitting: Cardiovascular Disease

## 2016-06-24 ENCOUNTER — Encounter: Payer: Self-pay | Admitting: Cardiovascular Disease

## 2016-06-24 ENCOUNTER — Ambulatory Visit (INDEPENDENT_AMBULATORY_CARE_PROVIDER_SITE_OTHER): Payer: Medicare Other | Admitting: Pharmacist

## 2016-06-24 ENCOUNTER — Ambulatory Visit (INDEPENDENT_AMBULATORY_CARE_PROVIDER_SITE_OTHER): Payer: Medicare Other | Admitting: Cardiovascular Disease

## 2016-06-24 VITALS — BP 117/78 | HR 72 | Ht 66.0 in | Wt 202.0 lb

## 2016-06-24 DIAGNOSIS — Z5181 Encounter for therapeutic drug level monitoring: Secondary | ICD-10-CM

## 2016-06-24 DIAGNOSIS — I482 Chronic atrial fibrillation, unspecified: Secondary | ICD-10-CM

## 2016-06-24 DIAGNOSIS — I10 Essential (primary) hypertension: Secondary | ICD-10-CM

## 2016-06-24 DIAGNOSIS — Z7901 Long term (current) use of anticoagulants: Secondary | ICD-10-CM | POA: Diagnosis not present

## 2016-06-24 LAB — POCT INR: INR: 2.3

## 2016-06-24 MED ORDER — CARVEDILOL 25 MG PO TABS
25.0000 mg | ORAL_TABLET | Freq: Two times a day (BID) | ORAL | 3 refills | Status: DC
Start: 2016-06-24 — End: 2017-07-10

## 2016-06-24 NOTE — Patient Instructions (Signed)
Medication Instructions:  Your physician recommends that you continue on your current medications as directed. Please refer to the Current Medication list given to you today.   Labwork: None Ordered   Testing/Procedures: None Ordered   Follow-Up: Your physician wants you to follow-up in: 6 months with Dr. Nahser.  You will receive a reminder letter in the mail two months in advance. If you don't receive a letter, please call our office to schedule the follow-up appointment.   If you need a refill on your cardiac medications before your next appointment, please call your pharmacy.   Thank you for choosing [facility]! Annalise Mcdiarmid, RN [phone]    

## 2016-06-24 NOTE — Progress Notes (Signed)
Cardiology Office Note   Date:  06/24/2016   ID:  Kayla Hale, DOB [birth year], MRN [id number]  PCP:  Wenda Low, MD  Cardiologist:   Mertie Moores, MD   Chief Complaint  Patient presents with  . Atrial Fibrillation   1. Atrial fibrillation - CHADS VASC score = 3 2. Hypertension  Kayla Hale is a 71 yo who is referred for atrial fib. Hx of HTN.  Kayla Hale is basically a symptomatically Kayla Hale was found to have an irregular heart rate on A visit to Urgent care ( which was for a bladder infection) . EKG revealed atrial fibrillation. Kayla Hale does have occasional episodes of palpitations but he seemed to be more related to stress.  Nonsmoker Rare ETOH -  Fhx - father died of MI. ( age 34s), sister has CAd,,   April. 26, 2016: Kayla Hale is a 71 y.o. female who presents for  Atrial fib.  Kayla Hale's done very well. Kayla Hale's been keeping a record of her blood pressure readings and for the most part they are  In the normal  Range. Kayla Hale has not been cardioverted.   Kayla Hale has had atrial fib for many years.    12/24/2014:  Kayla Hale is doing well Is in the donut hole ( mostly from Eliquis )  No CP or dyspnea.  Oct. 31, 2017:  Kayla Hale is seen Today for follow-up of her hypertension and atrial fibrillation. Is not working anymore at [employer] ( previously worked at [employer] )  Exercising - walks every day for 30 minutes.     June 24, 2016:  Kayla Hale is seen for follow up .  Is working - hosting open houses on the weekends. . No CP or dyspnea.   Tries to walk every day for 30 minutes   Past Medical History:  Diagnosis Date  . Atrial fibrillation ([place])   . Chronic kidney disease    STAGE 2  . Diverticulosis 2004  . Dyslipidemia   . Gallstones   . GERD (gastroesophageal reflux disease)   . Hyperlipidemia   . Hypertension   . Hypothyroidism   . Mild anemia   . Mild obesity   . Seasonal allergies   . Shingles 2005   LOWER BACK  . Thyroid disease   . Urinary incontinence     Past Surgical History:    Procedure Laterality Date  . CHOLECYSTECTOMY N/A 03/16/2014   Procedure: LAPAROSCOPIC CHOLECYSTECTOMY WITH INTRAOPERATIVE CHOLANGIOGRAM;  Surgeon: Rolm Bookbinder, MD;  Location: [facility];  Service: General;  Laterality: N/A;  . COLONOSCOPY    . TUBAL LIGATION       Current Outpatient Prescriptions  Medication Sig Dispense Refill  . ALPRAZolam (XANAX) 0.5 MG tablet Take 0.5 mg by mouth See admin instructions. Takes 1 tablet by mouth daily    . Calcium Carbonate-Vitamin D (CALCIUM-VITAMIN D) 500-200 MG-UNIT per tablet Take 1 tablet by mouth daily.    . cholecalciferol (VITAMIN D) 1000 UNITS tablet Take 1,000 Units by mouth daily.    Marland Kitchen levothyroxine (SYNTHROID, LEVOTHROID) 75 MCG tablet Take 75 mcg by mouth daily before breakfast.    . Melatonin 200 MCG TABS Take 200 mcg by mouth at bedtime as needed.    . Multiple Vitamin (MULTIVITAMIN) tablet Take 1 tablet by mouth daily.    Marland Kitchen omeprazole (PRILOSEC) 20 MG capsule Take 20 mg by mouth daily.    Marland Kitchen oxybutynin (DITROPAN) 5 MG tablet Take 5 mg by mouth See admin instructions. States Kayla Hale takes 2  Tablets by mouth  daily    . pravastatin (PRAVACHOL) 20 MG tablet Take 20 mg by mouth daily.    Marland Kitchen warfarin (COUMADIN) 5 MG tablet TAKE AS DIRECTED BY COUMADIN CLINIC. 120 tablet 1  . carvedilol (COREG) 25 MG tablet Take 1 tablet (25 mg total) by mouth 2 (two) times daily. 180 tablet 3   No current facility-administered medications for this visit.     Allergies:   Lisinopril and Norvasc [amlodipine besylate]    Social History:  The patient  reports that Kayla Hale has never smoked. Kayla Hale has never used smokeless tobacco. Kayla Hale reports that Kayla Hale does not drink alcohol or use drugs.   Family History:  The patient's family history includes CVA in her brother; Diabetes in her brother and sister; Heart attack in her father and sister; Hypertension in her father.    ROS:  Please see the history of present illness.    Review of Systems: Constitutional:  denies  fever, chills, diaphoresis, appetite change and fatigue.  HEENT: denies photophobia, eye pain, redness, hearing loss, ear pain, congestion, sore throat, rhinorrhea, sneezing, neck pain, neck stiffness and tinnitus.  Respiratory: denies SOB, DOE, cough, chest tightness, and wheezing.  Cardiovascular: denies chest pain, palpitations and leg swelling.  Gastrointestinal: denies nausea, vomiting, abdominal pain, diarrhea, constipation, blood in stool.  Genitourinary: denies dysuria, urgency, frequency, hematuria, flank pain and difficulty urinating.  Musculoskeletal: denies  myalgias, back pain, joint swelling, arthralgias and gait problem.   Skin: denies pallor, rash and wound.  Neurological: denies dizziness, seizures, syncope, weakness, light-headedness, numbness and headaches.   Hematological: denies adenopathy, easy bruising, personal or family bleeding history.  Psychiatric/ Behavioral: denies suicidal ideation, mood changes, confusion, nervousness, sleep disturbance and agitation.       All other systems are reviewed and negative.    PHYSICAL EXAM: VS:  BP 117/78 (BP Location: Right Arm, Patient Position: Sitting, Cuff Size: Normal)   Pulse 72   Ht 5\' 6"  (1.676 m)   Wt 202 lb (91.6 kg)   BMI 32.60 kg/m  , BMI Body mass index is 32.6 kg/m. GEN: Well nourished, well developed, in no acute distress  HEENT: normal  Neck: no JVD, carotid bruits, or masses Cardiac: Irreg. Irreg; no murmurs, rubs, or gallops, 1+ bilateral edema  Respiratory:  clear to auscultation bilaterally, normal work of breathing GI: soft, nontender, nondistended, + BS MS: no deformity or atrophy  Skin: warm and dry, no rash Neuro:  Strength and sensation are intact Psych: normal   EKG:  EKG is ordered today. The ekg ordered today demonstrates  Atrial fib with Vent rate of 69.  No ST or T wave changes.    Recent Labs: No results found for requested labs within last 8760 hours.    Lipid Panel No results  found for: CHOL, TRIG, HDL, CHOLHDL, VLDL, LDLCALC, LDLDIRECT    Wt Readings from Last 3 Encounters:  06/24/16 202 lb (91.6 kg)  05/07/16 202 lb 12.8 oz (92 kg)  12/31/15 194 lb (88 kg)      Other studies Reviewed: Additional studies/ records that were reviewed today include: . Review of the above records demonstrates:    ASSESSMENT AND PLAN:  1. Atrial fibrillation - CHADS VASC score = 3 Kayla Hale is doing well.  Basically asymptomatic.  Continue current meds.  Continue coumadin ,  INR today .   Last INR was 2.3   2. Hypertension -  Will change metoprolol to Coreg 25 mg PO BID . Nurse visit BP check in 6  weeks ( Kayla Hale has a month of metoprolol left)   3. Hyperlipidemia:  Managed by her primary MD   4.  Leg swelling :   Has normal LV function . I've recommended that Kayla Hale try some total lives on occasion when Kayla Hale is standing. Kayla Hale'll try compression hose if the swelling gets too bad. Kayla Hale also may try leg elevation in the evenings.  Will have her watch her salt intake .   Current medicines are reviewed at length with the patient today.  The patient does not have concerns regarding medicines.  The following changes have been made:  no change  Labs/ tests ordered today include:  No orders of the defined types were placed in this encounter.    Disposition:   FU with me in 6 months .     Signed, Mertie Moores, MD  06/24/2016 4:16 PM    [facility] [place], [place], [place]  [address] Phone: [phone]; Fax: [phone]

## 2016-07-21 ENCOUNTER — Ambulatory Visit (INDEPENDENT_AMBULATORY_CARE_PROVIDER_SITE_OTHER): Payer: Medicare Other | Admitting: *Deleted

## 2016-07-21 DIAGNOSIS — I482 Chronic atrial fibrillation, unspecified: Secondary | ICD-10-CM

## 2016-07-21 DIAGNOSIS — Z7901 Long term (current) use of anticoagulants: Secondary | ICD-10-CM

## 2016-07-21 DIAGNOSIS — Z5181 Encounter for therapeutic drug level monitoring: Secondary | ICD-10-CM

## 2016-07-21 LAB — POCT INR: INR: 3.2

## 2016-08-03 ENCOUNTER — Other Ambulatory Visit: Payer: Self-pay | Admitting: Cardiovascular Disease

## 2016-08-17 DIAGNOSIS — M25561 Pain in right knee: Secondary | ICD-10-CM | POA: Diagnosis not present

## 2016-08-17 DIAGNOSIS — M25562 Pain in left knee: Secondary | ICD-10-CM | POA: Diagnosis not present

## 2016-08-17 DIAGNOSIS — M170 Bilateral primary osteoarthritis of knee: Secondary | ICD-10-CM | POA: Diagnosis not present

## 2016-08-18 DIAGNOSIS — I10 Essential (primary) hypertension: Secondary | ICD-10-CM | POA: Diagnosis not present

## 2016-08-18 DIAGNOSIS — Z1211 Encounter for screening for malignant neoplasm of colon: Secondary | ICD-10-CM | POA: Diagnosis not present

## 2016-08-18 DIAGNOSIS — E039 Hypothyroidism, unspecified: Secondary | ICD-10-CM | POA: Diagnosis not present

## 2016-08-18 DIAGNOSIS — E7800 Pure hypercholesterolemia, unspecified: Secondary | ICD-10-CM | POA: Diagnosis not present

## 2016-08-18 DIAGNOSIS — N183 Chronic kidney disease, stage 3 (moderate): Secondary | ICD-10-CM | POA: Diagnosis not present

## 2016-08-18 DIAGNOSIS — I482 Chronic atrial fibrillation: Secondary | ICD-10-CM | POA: Diagnosis not present

## 2016-08-18 DIAGNOSIS — J309 Allergic rhinitis, unspecified: Secondary | ICD-10-CM | POA: Diagnosis not present

## 2016-08-18 DIAGNOSIS — R32 Unspecified urinary incontinence: Secondary | ICD-10-CM | POA: Diagnosis not present

## 2016-08-20 ENCOUNTER — Ambulatory Visit (INDEPENDENT_AMBULATORY_CARE_PROVIDER_SITE_OTHER): Payer: Medicare Other | Admitting: *Deleted

## 2016-08-20 DIAGNOSIS — I482 Chronic atrial fibrillation, unspecified: Secondary | ICD-10-CM

## 2016-08-20 DIAGNOSIS — Z5181 Encounter for therapeutic drug level monitoring: Secondary | ICD-10-CM

## 2016-08-20 DIAGNOSIS — Z7901 Long term (current) use of anticoagulants: Secondary | ICD-10-CM | POA: Diagnosis not present

## 2016-08-20 LAB — POCT INR: INR: 2.2

## 2016-08-26 DIAGNOSIS — M25561 Pain in right knee: Secondary | ICD-10-CM | POA: Diagnosis not present

## 2016-08-26 DIAGNOSIS — M25562 Pain in left knee: Secondary | ICD-10-CM | POA: Diagnosis not present

## 2016-08-26 DIAGNOSIS — M170 Bilateral primary osteoarthritis of knee: Secondary | ICD-10-CM | POA: Diagnosis not present

## 2016-09-01 DIAGNOSIS — M25562 Pain in left knee: Secondary | ICD-10-CM | POA: Diagnosis not present

## 2016-09-01 DIAGNOSIS — M170 Bilateral primary osteoarthritis of knee: Secondary | ICD-10-CM | POA: Diagnosis not present

## 2016-09-01 DIAGNOSIS — M25561 Pain in right knee: Secondary | ICD-10-CM | POA: Diagnosis not present

## 2016-09-08 DIAGNOSIS — M25561 Pain in right knee: Secondary | ICD-10-CM | POA: Diagnosis not present

## 2016-09-08 DIAGNOSIS — M170 Bilateral primary osteoarthritis of knee: Secondary | ICD-10-CM | POA: Diagnosis not present

## 2016-09-08 DIAGNOSIS — M25562 Pain in left knee: Secondary | ICD-10-CM | POA: Diagnosis not present

## 2016-09-15 DIAGNOSIS — Z1212 Encounter for screening for malignant neoplasm of rectum: Secondary | ICD-10-CM | POA: Diagnosis not present

## 2016-09-15 DIAGNOSIS — Z1211 Encounter for screening for malignant neoplasm of colon: Secondary | ICD-10-CM | POA: Diagnosis not present

## 2016-09-15 DIAGNOSIS — M25561 Pain in right knee: Secondary | ICD-10-CM | POA: Diagnosis not present

## 2016-09-15 DIAGNOSIS — M170 Bilateral primary osteoarthritis of knee: Secondary | ICD-10-CM | POA: Diagnosis not present

## 2016-09-15 DIAGNOSIS — M25562 Pain in left knee: Secondary | ICD-10-CM | POA: Diagnosis not present

## 2016-09-16 ENCOUNTER — Ambulatory Visit (INDEPENDENT_AMBULATORY_CARE_PROVIDER_SITE_OTHER): Payer: Medicare Other | Admitting: *Deleted

## 2016-09-16 DIAGNOSIS — Z5181 Encounter for therapeutic drug level monitoring: Secondary | ICD-10-CM | POA: Diagnosis not present

## 2016-09-16 DIAGNOSIS — I482 Chronic atrial fibrillation, unspecified: Secondary | ICD-10-CM

## 2016-09-16 DIAGNOSIS — Z7901 Long term (current) use of anticoagulants: Secondary | ICD-10-CM | POA: Diagnosis not present

## 2016-09-16 LAB — POCT INR: INR: 2.8

## 2016-10-21 ENCOUNTER — Ambulatory Visit (INDEPENDENT_AMBULATORY_CARE_PROVIDER_SITE_OTHER): Payer: Medicare Other | Admitting: *Deleted

## 2016-10-21 DIAGNOSIS — Z5181 Encounter for therapeutic drug level monitoring: Secondary | ICD-10-CM

## 2016-10-21 DIAGNOSIS — I482 Chronic atrial fibrillation, unspecified: Secondary | ICD-10-CM

## 2016-10-21 DIAGNOSIS — Z7901 Long term (current) use of anticoagulants: Secondary | ICD-10-CM

## 2016-10-21 LAB — POCT INR: INR: 3

## 2016-12-02 ENCOUNTER — Ambulatory Visit (INDEPENDENT_AMBULATORY_CARE_PROVIDER_SITE_OTHER): Payer: Medicare Other

## 2016-12-02 DIAGNOSIS — I482 Chronic atrial fibrillation, unspecified: Secondary | ICD-10-CM

## 2016-12-02 DIAGNOSIS — Z7901 Long term (current) use of anticoagulants: Secondary | ICD-10-CM

## 2016-12-02 DIAGNOSIS — Z5181 Encounter for therapeutic drug level monitoring: Secondary | ICD-10-CM

## 2016-12-02 LAB — POCT INR: INR: 3.3

## 2016-12-17 DIAGNOSIS — W57XXXA Bitten or stung by nonvenomous insect and other nonvenomous arthropods, initial encounter: Secondary | ICD-10-CM | POA: Diagnosis not present

## 2016-12-17 DIAGNOSIS — S90862A Insect bite (nonvenomous), left foot, initial encounter: Secondary | ICD-10-CM | POA: Diagnosis not present

## 2016-12-17 DIAGNOSIS — Z23 Encounter for immunization: Secondary | ICD-10-CM | POA: Diagnosis not present

## 2016-12-29 DIAGNOSIS — L821 Other seborrheic keratosis: Secondary | ICD-10-CM | POA: Diagnosis not present

## 2016-12-29 DIAGNOSIS — D1801 Hemangioma of skin and subcutaneous tissue: Secondary | ICD-10-CM | POA: Diagnosis not present

## 2016-12-29 DIAGNOSIS — D692 Other nonthrombocytopenic purpura: Secondary | ICD-10-CM | POA: Diagnosis not present

## 2016-12-29 DIAGNOSIS — L570 Actinic keratosis: Secondary | ICD-10-CM | POA: Diagnosis not present

## 2016-12-29 DIAGNOSIS — L814 Other melanin hyperpigmentation: Secondary | ICD-10-CM | POA: Diagnosis not present

## 2016-12-29 DIAGNOSIS — S80862A Insect bite (nonvenomous), left lower leg, initial encounter: Secondary | ICD-10-CM | POA: Diagnosis not present

## 2016-12-30 ENCOUNTER — Ambulatory Visit (INDEPENDENT_AMBULATORY_CARE_PROVIDER_SITE_OTHER): Payer: Medicare Other | Admitting: *Deleted

## 2016-12-30 DIAGNOSIS — I482 Chronic atrial fibrillation, unspecified: Secondary | ICD-10-CM

## 2016-12-30 DIAGNOSIS — Z7901 Long term (current) use of anticoagulants: Secondary | ICD-10-CM | POA: Diagnosis not present

## 2016-12-30 DIAGNOSIS — Z5181 Encounter for therapeutic drug level monitoring: Secondary | ICD-10-CM | POA: Diagnosis not present

## 2016-12-30 LAB — POCT INR: INR: 2.8

## 2017-01-27 ENCOUNTER — Ambulatory Visit (INDEPENDENT_AMBULATORY_CARE_PROVIDER_SITE_OTHER): Payer: Medicare Other

## 2017-01-27 DIAGNOSIS — I482 Chronic atrial fibrillation, unspecified: Secondary | ICD-10-CM

## 2017-01-27 DIAGNOSIS — Z5181 Encounter for therapeutic drug level monitoring: Secondary | ICD-10-CM | POA: Diagnosis not present

## 2017-01-27 DIAGNOSIS — Z7901 Long term (current) use of anticoagulants: Secondary | ICD-10-CM | POA: Diagnosis not present

## 2017-01-27 LAB — POCT INR: INR: 5.1

## 2017-01-27 NOTE — Patient Instructions (Signed)
Skip today and tomorrow's dosage of Coumadin, then start taking 1 tablet daily except 1.5 tablets on Mondays, Wednesdays and Fridays.  Recheck in 10 days.  Please call [phone] if you have any questions

## 2017-02-04 ENCOUNTER — Ambulatory Visit (INDEPENDENT_AMBULATORY_CARE_PROVIDER_SITE_OTHER): Payer: Medicare Other

## 2017-02-04 DIAGNOSIS — Z5181 Encounter for therapeutic drug level monitoring: Secondary | ICD-10-CM

## 2017-02-04 DIAGNOSIS — Z7901 Long term (current) use of anticoagulants: Secondary | ICD-10-CM

## 2017-02-04 DIAGNOSIS — I482 Chronic atrial fibrillation, unspecified: Secondary | ICD-10-CM

## 2017-02-04 LAB — POCT INR: INR: 2

## 2017-02-04 NOTE — Patient Instructions (Signed)
Continue on same dosage 1 tablet daily except 1.5 tablets on Mondays, Wednesdays and Fridays.  Recheck in 2 weeks.  Please call [phone] if you have any questions

## 2017-02-16 DIAGNOSIS — Z1389 Encounter for screening for other disorder: Secondary | ICD-10-CM | POA: Diagnosis not present

## 2017-02-16 DIAGNOSIS — N183 Chronic kidney disease, stage 3 (moderate): Secondary | ICD-10-CM | POA: Diagnosis not present

## 2017-02-16 DIAGNOSIS — I482 Chronic atrial fibrillation: Secondary | ICD-10-CM | POA: Diagnosis not present

## 2017-02-16 DIAGNOSIS — E039 Hypothyroidism, unspecified: Secondary | ICD-10-CM | POA: Diagnosis not present

## 2017-02-16 DIAGNOSIS — J309 Allergic rhinitis, unspecified: Secondary | ICD-10-CM | POA: Diagnosis not present

## 2017-02-16 DIAGNOSIS — E7800 Pure hypercholesterolemia, unspecified: Secondary | ICD-10-CM | POA: Diagnosis not present

## 2017-02-16 DIAGNOSIS — I10 Essential (primary) hypertension: Secondary | ICD-10-CM | POA: Diagnosis not present

## 2017-02-16 DIAGNOSIS — R7309 Other abnormal glucose: Secondary | ICD-10-CM | POA: Diagnosis not present

## 2017-02-16 DIAGNOSIS — R32 Unspecified urinary incontinence: Secondary | ICD-10-CM | POA: Diagnosis not present

## 2017-02-16 DIAGNOSIS — Z0000 Encounter for general adult medical examination without abnormal findings: Secondary | ICD-10-CM | POA: Diagnosis not present

## 2017-02-18 ENCOUNTER — Ambulatory Visit (INDEPENDENT_AMBULATORY_CARE_PROVIDER_SITE_OTHER): Payer: Medicare Other | Admitting: *Deleted

## 2017-02-18 DIAGNOSIS — I482 Chronic atrial fibrillation, unspecified: Secondary | ICD-10-CM

## 2017-02-18 DIAGNOSIS — Z7901 Long term (current) use of anticoagulants: Secondary | ICD-10-CM | POA: Diagnosis not present

## 2017-02-18 DIAGNOSIS — Z5181 Encounter for therapeutic drug level monitoring: Secondary | ICD-10-CM | POA: Diagnosis not present

## 2017-02-18 LAB — POCT INR: INR: 2.4

## 2017-02-18 NOTE — Patient Instructions (Signed)
Description   Continue on same dosage 1 tablet daily except 1.5 tablets on Mondays, Wednesdays and Fridays.  Recheck in 3 weeks.  Please call [phone] if you have any questions

## 2017-02-19 DIAGNOSIS — D649 Anemia, unspecified: Secondary | ICD-10-CM | POA: Insufficient documentation

## 2017-02-19 DIAGNOSIS — E785 Hyperlipidemia, unspecified: Secondary | ICD-10-CM | POA: Insufficient documentation

## 2017-02-19 DIAGNOSIS — K8020 Calculus of gallbladder without cholecystitis without obstruction: Secondary | ICD-10-CM | POA: Insufficient documentation

## 2017-02-19 DIAGNOSIS — R32 Unspecified urinary incontinence: Secondary | ICD-10-CM | POA: Insufficient documentation

## 2017-02-19 DIAGNOSIS — J302 Other seasonal allergic rhinitis: Secondary | ICD-10-CM | POA: Insufficient documentation

## 2017-02-19 DIAGNOSIS — N189 Chronic kidney disease, unspecified: Secondary | ICD-10-CM | POA: Insufficient documentation

## 2017-02-19 DIAGNOSIS — I4891 Unspecified atrial fibrillation: Secondary | ICD-10-CM | POA: Insufficient documentation

## 2017-02-19 DIAGNOSIS — E669 Obesity, unspecified: Secondary | ICD-10-CM | POA: Insufficient documentation

## 2017-02-19 DIAGNOSIS — I4821 Permanent atrial fibrillation: Secondary | ICD-10-CM | POA: Insufficient documentation

## 2017-02-19 DIAGNOSIS — E039 Hypothyroidism, unspecified: Secondary | ICD-10-CM | POA: Insufficient documentation

## 2017-02-19 DIAGNOSIS — K219 Gastro-esophageal reflux disease without esophagitis: Secondary | ICD-10-CM | POA: Insufficient documentation

## 2017-02-19 DIAGNOSIS — E079 Disorder of thyroid, unspecified: Secondary | ICD-10-CM | POA: Insufficient documentation

## 2017-02-19 DIAGNOSIS — I10 Essential (primary) hypertension: Secondary | ICD-10-CM | POA: Insufficient documentation

## 2017-02-25 DIAGNOSIS — H1033 Unspecified acute conjunctivitis, bilateral: Secondary | ICD-10-CM | POA: Diagnosis not present

## 2017-02-26 ENCOUNTER — Telehealth: Payer: Self-pay | Admitting: *Deleted

## 2017-02-26 NOTE — Telephone Encounter (Signed)
Pt called to report that she had a blood vessel to pop in her eye & she saw a Physician & she will follow up with her Optometrist. Also, she stated that her eye is looking better and has had no vision changes or any bleeding issues. Pt advised to call back to report any changes or the need for a sooner appt.

## 2017-03-09 ENCOUNTER — Encounter (INDEPENDENT_AMBULATORY_CARE_PROVIDER_SITE_OTHER): Payer: Self-pay

## 2017-03-09 ENCOUNTER — Ambulatory Visit (INDEPENDENT_AMBULATORY_CARE_PROVIDER_SITE_OTHER): Payer: Medicare Other | Admitting: Pharmacist

## 2017-03-09 ENCOUNTER — Ambulatory Visit (INDEPENDENT_AMBULATORY_CARE_PROVIDER_SITE_OTHER): Payer: Medicare Other | Admitting: Cardiovascular Disease

## 2017-03-09 ENCOUNTER — Encounter: Payer: Self-pay | Admitting: Cardiovascular Disease

## 2017-03-09 VITALS — BP 126/74 | HR 92 | Ht 66.0 in | Wt 211.8 lb

## 2017-03-09 DIAGNOSIS — Z7901 Long term (current) use of anticoagulants: Secondary | ICD-10-CM | POA: Diagnosis not present

## 2017-03-09 DIAGNOSIS — I482 Chronic atrial fibrillation, unspecified: Secondary | ICD-10-CM

## 2017-03-09 DIAGNOSIS — I10 Essential (primary) hypertension: Secondary | ICD-10-CM

## 2017-03-09 DIAGNOSIS — Z5181 Encounter for therapeutic drug level monitoring: Secondary | ICD-10-CM | POA: Diagnosis not present

## 2017-03-09 LAB — POCT INR: INR: 2.1

## 2017-03-09 NOTE — Patient Instructions (Signed)
Medication Instructions:  Your physician recommends that you continue on your current medications as directed. Please refer to the Current Medication list given to you today.   Labwork: None Ordered   Testing/Procedures: None Ordered   Follow-Up: Your physician wants you to follow-up in: 1 year with Dr. Nahser.  You will receive a reminder letter in the mail two months in advance. If you don't receive a letter, please call our office to schedule the follow-up appointment.   If you need a refill on your cardiac medications before your next appointment, please call your pharmacy.   Thank you for choosing [facility]! Verble Styron, RN [phone]    

## 2017-03-09 NOTE — Patient Instructions (Signed)
Description   Continue on same dosage 1 tablet daily except 1.5 tablets on Mondays, Wednesdays and Fridays.  Recheck in 4 weeks.  Please call [phone] if you have any questions

## 2017-03-09 NOTE — Progress Notes (Signed)
Cardiology Office Note   Date:  03/09/2017   ID:  Kayla Hale, DOB 05-07-1945, MRN [id number]  PCP:  Wenda Low, MD  Cardiologist:   Mertie Moores, MD   Chief Complaint  Patient presents with  . Atrial Fibrillation   1. Atrial fibrillation - CHADS VASC score = 3 2. Hypertension  Kayla Hale is a 72 yo who is referred for atrial fib. Hx of HTN.  She is basically a symptomatically she was found to have an irregular heart rate on A visit to Urgent care ( which was for a bladder infection) . EKG revealed atrial fibrillation. She does have occasional episodes of palpitations but he seemed to be more related to stress.  Nonsmoker Rare ETOH -  Fhx - father died of MI. ( age 18s), sister has CAd,,   April. 26, 2016: Kayla Hale is a 72 y.o. female who presents for  Atrial fib.  She's done very well. She's been keeping a record of her blood pressure readings and for the most part they are  In the normal  Range. She has not been cardioverted.   She has had atrial fib for many years.    12/24/2014:  She is doing well Is in the donut hole ( mostly from Eliquis )  No CP or dyspnea.  Oct. 31, 2017:  Kayla Hale is seen Today for follow-up of her hypertension and atrial fibrillation. Is not working anymore at [employer] ( previously worked at [employer] )  Exercising - walks every day for 30 minutes.     June 24, 2016:  Kayla Hale is seen for follow up .  Is working - hosting open houses on the weekends. . No CP or dyspnea.   Tries to walk every day for 30 minutes   March 09, 2017:  Doing well.  INR was 2.1 today .   Wants to change to a DOAC ,   She Will call her insurance co. Has gained some weight .  Walking 3 times a week .   Is watching her carbs ( recently )    Past Medical History:  Diagnosis Date  . Atrial fibrillation ([place])   . Chronic kidney disease    STAGE 2  . Diverticulosis 2004  . Dyslipidemia   . Gallstones   . GERD (gastroesophageal reflux disease)   .  Hyperlipidemia   . Hypertension   . Hypothyroidism   . Mild anemia   . Mild obesity   . Seasonal allergies   . Shingles 2005   LOWER BACK  . Thyroid disease   . Urinary incontinence     Past Surgical History:  Procedure Laterality Date  . CHOLECYSTECTOMY N/A 03/16/2014   Procedure: LAPAROSCOPIC CHOLECYSTECTOMY WITH INTRAOPERATIVE CHOLANGIOGRAM;  Surgeon: Rolm Bookbinder, MD;  Location: [place];  Service: General;  Laterality: N/A;  . COLONOSCOPY    . TUBAL LIGATION       Current Outpatient Medications  Medication Sig Dispense Refill  . ALPRAZolam (XANAX) 0.5 MG tablet Take 0.5 mg by mouth See admin instructions. Takes 1 tablet by mouth daily    . Calcium Carbonate-Vitamin D (CALCIUM-VITAMIN D) 500-200 MG-UNIT per tablet Take 1 tablet by mouth daily.    . carvedilol (COREG) 25 MG tablet Take 1 tablet (25 mg total) by mouth 2 (two) times daily. 180 tablet 3  . cholecalciferol (VITAMIN D) 1000 UNITS tablet Take 1,000 Units by mouth daily.    Marland Kitchen levothyroxine (SYNTHROID, LEVOTHROID) 75 MCG tablet Take 75 mcg by  mouth daily before breakfast.    . Melatonin 200 MCG TABS Take 200 mcg by mouth at bedtime as needed.    . Multiple Vitamin (MULTIVITAMIN) tablet Take 1 tablet by mouth daily.    Marland Kitchen omeprazole (PRILOSEC) 20 MG capsule Take 20 mg by mouth daily.    Marland Kitchen oxybutynin (DITROPAN) 5 MG tablet Take 5 mg by mouth See admin instructions. States she takes 2  Tablets by mouth daily    . pravastatin (PRAVACHOL) 20 MG tablet Take 20 mg by mouth daily.    Marland Kitchen warfarin (COUMADIN) 5 MG tablet TAKE AS DIRECTED BY COUMADIN CLINIC 120 tablet 1   No current facility-administered medications for this visit.     Allergies:   Lisinopril and Norvasc [amlodipine besylate]    Social History:  The patient  reports that  has never smoked. she has never used smokeless tobacco. She reports that she does not drink alcohol or use drugs.   Family History:  The patient's family history includes CVA in her  brother; Diabetes in her brother and sister; Heart attack in her father and sister; Hypertension in her father.    ROS: As per current history.  Otherwise negative.   Physical Exam: Blood pressure 126/74, pulse 92, height 5\' 6"  (1.676 m), weight 211 lb 12.8 oz (96.1 kg), SpO2 95 %.  GEN:  Well nourished, well developed in no acute distress HEENT: Normal NECK: No JVD; No carotid bruits LYMPHATICS: No lymphadenopathy CARDIAC: irreg. Irreg.   RESPIRATORY:  Clear to auscultation without rales, wheezing or rhonchi  ABDOMEN: Soft, non-tender, non-distended MUSCULOSKELETAL:  No edema; No deformity  SKIN: Warm and dry NEUROLOGIC:  Alert and oriented x 3   EKG:     March 09, 2016: Atrial fibrillation with a heart rate of 92.  No ST or T wave changes.    Recent Labs: No results found for requested labs within last 8760 hours.    Lipid Panel No results found for: CHOL, TRIG, HDL, CHOLHDL, VLDL, LDLCALC, LDLDIRECT    Wt Readings from Last 3 Encounters:  03/09/17 211 lb 12.8 oz (96.1 kg)  06/24/16 202 lb (91.6 kg)  05/07/16 202 lb 12.8 oz (92 kg)      Other studies Reviewed: Additional studies/ records that were reviewed today include: . Review of the above records demonstrates:    ASSESSMENT AND PLAN:  1. Atrial fibrillation - CHADS VASC score = 3 Continue Coumadin.   Wants to change to 1 of the newer agents.  She will be calling her insurance company to see if 1 of them is on the preferred list.  2. Hypertension -   BP is stable  Encouraged her to exercise on a regular basis.  3. Hyperlipidemia:    Managed by her primiary MD    Current medicines are reviewed at length with the patient today.  The patient does not have concerns regarding medicines.  The following changes have been made:  no change  Labs/ tests ordered today include:  No orders of the defined types were placed in this encounter.    Disposition:   FU with me in 1 year    Signed, Mertie Moores,  MD  03/09/2017 4:37 PM    [address], [place], [place]  [address] Phone: [phone]; Fax: [phone]

## 2017-03-16 ENCOUNTER — Telehealth: Payer: Self-pay | Admitting: Cardiovascular Disease

## 2017-03-16 MED ORDER — RIVAROXABAN 20 MG PO TABS: 20.0000 mg | ORAL_TABLET | Freq: Every day | ORAL | 3 refills | Status: DC

## 2017-03-16 NOTE — Telephone Encounter (Signed)
New Message  Pt c/o medication issue:  1. Name of Medication: Eliquis/ Xarelto   2. How are you currently taking this medication (dosage and times per day)? N/A  3. Are you having a reaction (difficulty breathing--STAT)? No   4. What is your medication issue? Pt call getting one of the medications at a discounted price.

## 2017-03-16 NOTE — Telephone Encounter (Signed)
Spoke with patient regarding switching from coumadin to Xarelto as discussed at last ov with Dr. Acie Fredrickson on 03/09/17. She states she is concerned Xarelto may not be affordable for her but appreciates that she is at less risk for bleeding issues with Coumadin. I advised her that I will forward the request to our Patient Care Advocate, Jeani Hawking Via, LPN for her help with getting approval and helping her with cost.

## 2017-03-17 ENCOUNTER — Telehealth: Payer: Self-pay | Admitting: Cardiovascular Disease

## 2017-03-17 NOTE — Telephone Encounter (Signed)
New Message     Patient calling upset said she spoke with Sharyn Lull yesterday and told her she could not afford the Xarelto and needed financial assistance.  Prescription was sent to pharmacy even though she told nurse she could not afford it.  Pharmacy filled prescription, causing financial hardship for patient.

## 2017-03-17 NOTE — Telephone Encounter (Signed)
Spoke with patient who states [employer] deducted $590 from her checking account when they received the Rx for Xarelto. She states she has called and declined the Rx but was not aware this was going to occur. I explained that the Rx was sent so that we could start the process of getting it approved at a lower cost. I apologized that the drug cost was charged to her without her awareness. She asks that we make certain we do not send another Rx of such great cost without her awareness. She states she is willing to pay $40 or less per month for the anti-coagulant. She is aware that our office is going to work on finding out what the lowest possible cost for her will be. I apologized again for the burden and she thanked me for my help. She is aware to continue her Coumadin as directed by Coumadin clinic until further notice. She thanked me for the call.

## 2017-03-18 DIAGNOSIS — M25562 Pain in left knee: Secondary | ICD-10-CM | POA: Diagnosis not present

## 2017-03-18 DIAGNOSIS — M25561 Pain in right knee: Secondary | ICD-10-CM | POA: Diagnosis not present

## 2017-03-18 DIAGNOSIS — M170 Bilateral primary osteoarthritis of knee: Secondary | ICD-10-CM | POA: Diagnosis not present

## 2017-03-18 DIAGNOSIS — Z9229 Personal history of other drug therapy: Secondary | ICD-10-CM | POA: Diagnosis not present

## 2017-03-18 NOTE — Telephone Encounter (Signed)
I called and left Kayla Hale a message to call to the prior authorization department so we can get information on her medicare part D/medication payor, we need this for a prior authorization or tier exception for XARELTO.

## 2017-03-18 NOTE — Telephone Encounter (Signed)
Follow up  Patient was returning call what other numbers do you need , she is driving and can not answer phone , she will wait 5 minutes then she has to leave   [facility] Id number [id number]

## 2017-03-19 ENCOUNTER — Telehealth: Payer: Self-pay | Admitting: Cardiovascular Disease

## 2017-03-19 NOTE — Telephone Encounter (Signed)
Will route to Dr. Elmarie Shiley nurse to make her aware.

## 2017-03-19 NOTE — Telephone Encounter (Signed)
**Note De-Identified Akaiya Touchette Obfuscation** I called the pt to get needed info from her in order to do a PA or a tier exception on Xarelto. She answered the phone and stated that she cannot talk to me right now because she is walking and that she will call me back.  I called [employer] mail in pharmacy and got the pts insurance info. I asked if the pts Xarelto needs a PA and I was advised that they cannot tell me that until the pt pays her deductible which is $540.  I spoke with Fuller Canada, Pharm D who states that the pt may not need a tier exception or a PA and that until she meets her deductible we will not know what is needed to get Xarelto at a lower price for the pt.   Will wait for the pt to call me back.

## 2017-03-19 NOTE — Telephone Encounter (Signed)
New message   Patient calling to inform nurse that she plans to stay on the Coumadin.  Unable to pay $500 deductible to change to other medication.

## 2017-03-19 NOTE — Telephone Encounter (Signed)
**Note De-Identified Mayzee Reichenbach Obfuscation** The pt called back and I explained to her that we have to send a Prescription in to her pharmacy in order to start the process of getting a PA or tier exception on her Xarelto and that [employer] cannot tell me which is needed until she meets her deductible.  She stated that she cannot take a risk of that amount of money coming out of her banking account again so she wants Dr Acie Fredrickson and his nurse Sharyn Lull to know that she wants to continue to take Coumadin.  She is advised to call me back if she changes her mind. She verbalized understanding and thanked me for my assistance.

## 2017-03-22 ENCOUNTER — Other Ambulatory Visit: Payer: Self-pay | Admitting: Internal Medicine

## 2017-03-22 DIAGNOSIS — Z1231 Encounter for screening mammogram for malignant neoplasm of breast: Secondary | ICD-10-CM

## 2017-03-23 DIAGNOSIS — J0100 Acute maxillary sinusitis, unspecified: Secondary | ICD-10-CM | POA: Diagnosis not present

## 2017-03-24 MED ORDER — WARFARIN SODIUM 5 MG PO TABS: ORAL_TABLET | ORAL | 1 refills | Status: DC

## 2017-03-24 NOTE — Addendum Note (Signed)
Addended by: Emmaline Life on: 03/24/2017 08:53 AM   Modules accepted: Orders

## 2017-03-24 NOTE — Telephone Encounter (Signed)
Updated patient's medication list to remove Xarelto and add Coumadin back per patient's preference

## 2017-03-29 ENCOUNTER — Other Ambulatory Visit: Payer: Self-pay | Admitting: Nurse Practitioner

## 2017-03-29 ENCOUNTER — Ambulatory Visit (INDEPENDENT_AMBULATORY_CARE_PROVIDER_SITE_OTHER): Payer: Medicare Other | Admitting: *Deleted

## 2017-03-29 ENCOUNTER — Ambulatory Visit
Admission: RE | Admit: 2017-03-29 | Discharge: 2017-03-29 | Disposition: A | Discharge status: Home / Self Care | Payer: Medicare Other | Source: Ambulatory Visit | Attending: Nurse Practitioner | Admitting: Nurse Practitioner

## 2017-03-29 DIAGNOSIS — Z5181 Encounter for therapeutic drug level monitoring: Secondary | ICD-10-CM | POA: Diagnosis not present

## 2017-03-29 DIAGNOSIS — S61411A Laceration without foreign body of right hand, initial encounter: Secondary | ICD-10-CM | POA: Diagnosis not present

## 2017-03-29 DIAGNOSIS — R05 Cough: Secondary | ICD-10-CM

## 2017-03-29 DIAGNOSIS — R059 Cough, unspecified: Secondary | ICD-10-CM

## 2017-03-29 DIAGNOSIS — W19XXXA Unspecified fall, initial encounter: Secondary | ICD-10-CM | POA: Diagnosis not present

## 2017-03-29 DIAGNOSIS — I482 Chronic atrial fibrillation, unspecified: Secondary | ICD-10-CM

## 2017-03-29 DIAGNOSIS — J0100 Acute maxillary sinusitis, unspecified: Secondary | ICD-10-CM | POA: Diagnosis not present

## 2017-03-29 DIAGNOSIS — Z7901 Long term (current) use of anticoagulants: Secondary | ICD-10-CM

## 2017-03-29 LAB — POCT INR: INR: 2.1

## 2017-03-29 NOTE — Patient Instructions (Signed)
Description   Continue on same dosage 1 tablet daily except 1.5 tablets on Mondays, Wednesdays and Fridays.  Recheck in 4 weeks.  Please call [phone] if you have any questions

## 2017-04-05 ENCOUNTER — Ambulatory Visit
Admission: RE | Admit: 2017-04-05 | Discharge: 2017-04-05 | Disposition: A | Discharge status: Home / Self Care | Payer: Medicare Other | Source: Ambulatory Visit | Attending: Nurse Practitioner | Admitting: Nurse Practitioner

## 2017-04-05 ENCOUNTER — Other Ambulatory Visit: Payer: Self-pay | Admitting: Nurse Practitioner

## 2017-04-05 DIAGNOSIS — R05 Cough: Secondary | ICD-10-CM

## 2017-04-05 DIAGNOSIS — M545 Low back pain, unspecified: Secondary | ICD-10-CM

## 2017-04-05 DIAGNOSIS — M533 Sacrococcygeal disorders, not elsewhere classified: Secondary | ICD-10-CM | POA: Diagnosis not present

## 2017-04-05 DIAGNOSIS — J0101 Acute recurrent maxillary sinusitis: Secondary | ICD-10-CM | POA: Diagnosis not present

## 2017-04-05 DIAGNOSIS — R059 Cough, unspecified: Secondary | ICD-10-CM

## 2017-04-05 DIAGNOSIS — W19XXXD Unspecified fall, subsequent encounter: Secondary | ICD-10-CM | POA: Diagnosis not present

## 2017-04-15 DIAGNOSIS — M545 Low back pain: Secondary | ICD-10-CM | POA: Diagnosis not present

## 2017-04-27 ENCOUNTER — Ambulatory Visit (INDEPENDENT_AMBULATORY_CARE_PROVIDER_SITE_OTHER): Payer: Medicare Other | Admitting: *Deleted

## 2017-04-27 DIAGNOSIS — Z7901 Long term (current) use of anticoagulants: Secondary | ICD-10-CM | POA: Diagnosis not present

## 2017-04-27 DIAGNOSIS — I482 Chronic atrial fibrillation, unspecified: Secondary | ICD-10-CM

## 2017-04-27 DIAGNOSIS — M25561 Pain in right knee: Secondary | ICD-10-CM | POA: Diagnosis not present

## 2017-04-27 DIAGNOSIS — Z5181 Encounter for therapeutic drug level monitoring: Secondary | ICD-10-CM

## 2017-04-27 DIAGNOSIS — M170 Bilateral primary osteoarthritis of knee: Secondary | ICD-10-CM | POA: Diagnosis not present

## 2017-04-27 DIAGNOSIS — M25562 Pain in left knee: Secondary | ICD-10-CM | POA: Diagnosis not present

## 2017-04-27 LAB — POCT INR: INR: 1.9

## 2017-04-27 NOTE — Patient Instructions (Signed)
Description   Today take 1.5 tablets then continue on same dosage 1 tablet daily except 1.5 tablets on Mondays, Wednesdays and Fridays.  Recheck in 4 weeks.  Please call [phone] if you have any questions

## 2017-04-28 ENCOUNTER — Ambulatory Visit
Admission: RE | Admit: 2017-04-28 | Discharge: 2017-04-28 | Disposition: A | Discharge status: Home / Self Care | Payer: Medicare Other | Source: Ambulatory Visit | Attending: Internal Medicine | Admitting: Internal Medicine

## 2017-04-28 DIAGNOSIS — Z1231 Encounter for screening mammogram for malignant neoplasm of breast: Secondary | ICD-10-CM

## 2017-04-30 DIAGNOSIS — H52223 Regular astigmatism, bilateral: Secondary | ICD-10-CM | POA: Diagnosis not present

## 2017-04-30 DIAGNOSIS — H2513 Age-related nuclear cataract, bilateral: Secondary | ICD-10-CM | POA: Diagnosis not present

## 2017-05-05 DIAGNOSIS — M25562 Pain in left knee: Secondary | ICD-10-CM | POA: Diagnosis not present

## 2017-05-05 DIAGNOSIS — M170 Bilateral primary osteoarthritis of knee: Secondary | ICD-10-CM | POA: Diagnosis not present

## 2017-05-05 DIAGNOSIS — M25561 Pain in right knee: Secondary | ICD-10-CM | POA: Diagnosis not present

## 2017-05-10 DIAGNOSIS — M545 Low back pain: Secondary | ICD-10-CM | POA: Diagnosis not present

## 2017-05-11 DIAGNOSIS — M25561 Pain in right knee: Secondary | ICD-10-CM | POA: Diagnosis not present

## 2017-05-11 DIAGNOSIS — M25562 Pain in left knee: Secondary | ICD-10-CM | POA: Diagnosis not present

## 2017-05-11 DIAGNOSIS — M170 Bilateral primary osteoarthritis of knee: Secondary | ICD-10-CM | POA: Diagnosis not present

## 2017-05-19 DIAGNOSIS — M25561 Pain in right knee: Secondary | ICD-10-CM | POA: Diagnosis not present

## 2017-05-19 DIAGNOSIS — M25562 Pain in left knee: Secondary | ICD-10-CM | POA: Diagnosis not present

## 2017-05-19 DIAGNOSIS — M170 Bilateral primary osteoarthritis of knee: Secondary | ICD-10-CM | POA: Diagnosis not present

## 2017-05-25 ENCOUNTER — Ambulatory Visit (INDEPENDENT_AMBULATORY_CARE_PROVIDER_SITE_OTHER): Payer: Medicare Other | Admitting: *Deleted

## 2017-05-25 DIAGNOSIS — I482 Chronic atrial fibrillation, unspecified: Secondary | ICD-10-CM

## 2017-05-25 DIAGNOSIS — Z5181 Encounter for therapeutic drug level monitoring: Secondary | ICD-10-CM | POA: Diagnosis not present

## 2017-05-25 DIAGNOSIS — Z7901 Long term (current) use of anticoagulants: Secondary | ICD-10-CM

## 2017-05-25 LAB — POCT INR: INR: 3

## 2017-05-25 NOTE — Patient Instructions (Signed)
Description   Continue on same dosage 1 tablet daily except 1.5 tablets on Mondays, Wednesdays and Fridays.  Recheck in 4 weeks.  Please call [phone] if you have any questions

## 2017-06-22 ENCOUNTER — Ambulatory Visit (INDEPENDENT_AMBULATORY_CARE_PROVIDER_SITE_OTHER): Payer: Medicare Other | Admitting: *Deleted

## 2017-06-22 DIAGNOSIS — Z7901 Long term (current) use of anticoagulants: Secondary | ICD-10-CM

## 2017-06-22 DIAGNOSIS — Z5181 Encounter for therapeutic drug level monitoring: Secondary | ICD-10-CM | POA: Diagnosis not present

## 2017-06-22 DIAGNOSIS — I482 Chronic atrial fibrillation, unspecified: Secondary | ICD-10-CM

## 2017-06-22 LAB — POCT INR: INR: 2.6

## 2017-06-22 NOTE — Patient Instructions (Signed)
Description   Continue on same dosage 1 tablet daily except 1.5 tablets on Mondays, Wednesdays and Fridays.  Recheck in 4 weeks.  Please call [phone] if you have any questions

## 2017-06-25 ENCOUNTER — Other Ambulatory Visit: Payer: Self-pay | Admitting: *Deleted

## 2017-06-25 NOTE — Telephone Encounter (Signed)
Please forward to PCP

## 2017-06-25 NOTE — Telephone Encounter (Signed)
I do not see where Dr Acie Fredrickson has ever filled this for the patient. Okay to refill? Please advise. Thanks, MI

## 2017-06-29 ENCOUNTER — Other Ambulatory Visit: Payer: Self-pay

## 2017-07-06 ENCOUNTER — Telehealth: Payer: Self-pay | Admitting: Cardiovascular Disease

## 2017-07-06 ENCOUNTER — Other Ambulatory Visit: Payer: Self-pay | Admitting: Cardiovascular Disease

## 2017-07-06 NOTE — Telephone Encounter (Signed)
Pt's pharmacy is requesting a refill on Omeprazole 20 mg tablet. Would Dr. Acie Fredrickson like to refill this medication? Please Irineo Axon

## 2017-07-06 NOTE — Telephone Encounter (Signed)
Called pt and left a message informing pt that her pharmacy is requesting a refill on omeprazole and per Ann Maki, Lanice Schwab, RN  to [name], [facility]      06/25/17 5:08 PM  Note    Please forward to PCP     I also left message for pt to call back if you has any other problems, questions or concerns.

## 2017-07-06 NOTE — Telephone Encounter (Signed)
Me       07/06/17 10:31 AM  Note    Called pt and left a message informing pt that her pharmacy is requesting a refill on omeprazole and per Ann Maki, Lanice Schwab, RN  to [name], [facility]     06/25/17 5:08 PM  Note    Please forward to PCP     I also left message for pt to call back if she has any other problems, questions or concerns.

## 2017-07-10 ENCOUNTER — Other Ambulatory Visit: Payer: Self-pay | Admitting: Cardiovascular Disease

## 2017-07-12 ENCOUNTER — Other Ambulatory Visit: Payer: Self-pay | Admitting: *Deleted

## 2017-07-12 MED ORDER — WARFARIN SODIUM 5 MG PO TABS: ORAL_TABLET | ORAL | 1 refills | Status: DC

## 2017-07-22 ENCOUNTER — Ambulatory Visit (INDEPENDENT_AMBULATORY_CARE_PROVIDER_SITE_OTHER): Payer: Medicare Other | Admitting: *Deleted

## 2017-07-22 DIAGNOSIS — Z5181 Encounter for therapeutic drug level monitoring: Secondary | ICD-10-CM | POA: Diagnosis not present

## 2017-07-22 DIAGNOSIS — Z7901 Long term (current) use of anticoagulants: Secondary | ICD-10-CM

## 2017-07-22 DIAGNOSIS — I482 Chronic atrial fibrillation, unspecified: Secondary | ICD-10-CM

## 2017-07-22 LAB — POCT INR: INR: 2.9 (ref 2.0–3.0)

## 2017-07-22 NOTE — Patient Instructions (Signed)
Description   Continue on same dosage 1 tablet daily except 1.5 tablets on Mondays, Wednesdays and Fridays.  Recheck in 6 weeks.  Please call [phone] if you have any questions      

## 2017-08-02 ENCOUNTER — Telehealth: Payer: Self-pay | Admitting: Cardiovascular Disease

## 2017-08-02 DIAGNOSIS — Z6833 Body mass index (BMI) 33.0-33.9, adult: Secondary | ICD-10-CM | POA: Diagnosis not present

## 2017-08-02 DIAGNOSIS — I482 Chronic atrial fibrillation: Secondary | ICD-10-CM | POA: Diagnosis not present

## 2017-08-02 DIAGNOSIS — I10 Essential (primary) hypertension: Secondary | ICD-10-CM | POA: Diagnosis not present

## 2017-08-02 DIAGNOSIS — Z79899 Other long term (current) drug therapy: Secondary | ICD-10-CM | POA: Diagnosis not present

## 2017-08-02 DIAGNOSIS — N3281 Overactive bladder: Secondary | ICD-10-CM | POA: Diagnosis not present

## 2017-08-02 DIAGNOSIS — E039 Hypothyroidism, unspecified: Secondary | ICD-10-CM | POA: Diagnosis not present

## 2017-08-02 DIAGNOSIS — E7800 Pure hypercholesterolemia, unspecified: Secondary | ICD-10-CM | POA: Diagnosis not present

## 2017-08-02 NOTE — Telephone Encounter (Signed)
Pt called in to make [place] aware she now has a new PCP Ivin Booty Heyn,FNP w/ [facility].

## 2017-08-04 NOTE — Telephone Encounter (Signed)
Patient's PCP information has been updated in epic

## 2017-09-06 ENCOUNTER — Ambulatory Visit (INDEPENDENT_AMBULATORY_CARE_PROVIDER_SITE_OTHER): Payer: Medicare Other | Admitting: *Deleted

## 2017-09-06 DIAGNOSIS — Z5181 Encounter for therapeutic drug level monitoring: Secondary | ICD-10-CM

## 2017-09-06 DIAGNOSIS — I482 Chronic atrial fibrillation, unspecified: Secondary | ICD-10-CM

## 2017-09-06 DIAGNOSIS — Z7901 Long term (current) use of anticoagulants: Secondary | ICD-10-CM

## 2017-09-06 LAB — POCT INR: INR: 2.2 (ref 2.0–3.0)

## 2017-09-06 NOTE — Patient Instructions (Signed)
Description   Continue on same dosage 1 tablet daily except 1.5 tablets on Mondays, Wednesdays and Fridays.  Recheck in 6 weeks.  Please call [phone] if you have any questions      

## 2017-10-18 ENCOUNTER — Ambulatory Visit (INDEPENDENT_AMBULATORY_CARE_PROVIDER_SITE_OTHER): Payer: Medicare Other | Admitting: *Deleted

## 2017-10-18 DIAGNOSIS — I482 Chronic atrial fibrillation, unspecified: Secondary | ICD-10-CM

## 2017-10-18 DIAGNOSIS — Z5181 Encounter for therapeutic drug level monitoring: Secondary | ICD-10-CM

## 2017-10-18 DIAGNOSIS — Z7901 Long term (current) use of anticoagulants: Secondary | ICD-10-CM | POA: Diagnosis not present

## 2017-10-18 LAB — POCT INR: INR: 1.9 — AB (ref 2.0–3.0)

## 2017-10-18 NOTE — Patient Instructions (Signed)
Description   Today 2 tablets then continue on same dosage 1 tablet daily except 1.5 tablets on Mondays, Wednesdays and Fridays.  Recheck in 5 weeks.  Please call [phone] if you have any questions

## 2017-11-15 DIAGNOSIS — Z23 Encounter for immunization: Secondary | ICD-10-CM | POA: Diagnosis not present

## 2017-11-22 ENCOUNTER — Ambulatory Visit (INDEPENDENT_AMBULATORY_CARE_PROVIDER_SITE_OTHER): Payer: Medicare Other

## 2017-11-22 DIAGNOSIS — Z7901 Long term (current) use of anticoagulants: Secondary | ICD-10-CM

## 2017-11-22 DIAGNOSIS — Z5181 Encounter for therapeutic drug level monitoring: Secondary | ICD-10-CM

## 2017-11-22 DIAGNOSIS — I482 Chronic atrial fibrillation, unspecified: Secondary | ICD-10-CM

## 2017-11-22 LAB — POCT INR: INR: 2.2 (ref 2.0–3.0)

## 2017-11-22 NOTE — Patient Instructions (Signed)
Description   Continue on same dosage 1 tablet daily except 1.5 tablets on Mondays, Wednesdays and Fridays.  Recheck in 6 weeks.  Please call [phone] if you have any questions      

## 2017-12-30 ENCOUNTER — Other Ambulatory Visit: Payer: Self-pay | Admitting: Cardiovascular Disease

## 2018-01-03 ENCOUNTER — Ambulatory Visit (INDEPENDENT_AMBULATORY_CARE_PROVIDER_SITE_OTHER): Payer: Medicare Other

## 2018-01-03 DIAGNOSIS — Z5181 Encounter for therapeutic drug level monitoring: Secondary | ICD-10-CM | POA: Diagnosis not present

## 2018-01-03 DIAGNOSIS — Z7901 Long term (current) use of anticoagulants: Secondary | ICD-10-CM

## 2018-01-03 DIAGNOSIS — I482 Chronic atrial fibrillation, unspecified: Secondary | ICD-10-CM | POA: Diagnosis not present

## 2018-01-03 LAB — POCT INR: INR: 2.4 (ref 2.0–3.0)

## 2018-01-03 NOTE — Patient Instructions (Signed)
Description   Continue on same dosage 1 tablet daily except 1.5 tablets on Mondays, Wednesdays and Fridays.  Recheck in 6 weeks.  Please call [phone] if you have any questions      

## 2018-02-01 DIAGNOSIS — E7800 Pure hypercholesterolemia, unspecified: Secondary | ICD-10-CM | POA: Diagnosis not present

## 2018-02-01 DIAGNOSIS — M25561 Pain in right knee: Secondary | ICD-10-CM | POA: Diagnosis not present

## 2018-02-01 DIAGNOSIS — N3281 Overactive bladder: Secondary | ICD-10-CM | POA: Diagnosis not present

## 2018-02-01 DIAGNOSIS — M25562 Pain in left knee: Secondary | ICD-10-CM | POA: Diagnosis not present

## 2018-02-01 DIAGNOSIS — Z6833 Body mass index (BMI) 33.0-33.9, adult: Secondary | ICD-10-CM | POA: Diagnosis not present

## 2018-02-01 DIAGNOSIS — I10 Essential (primary) hypertension: Secondary | ICD-10-CM | POA: Diagnosis not present

## 2018-02-01 DIAGNOSIS — Z79899 Other long term (current) drug therapy: Secondary | ICD-10-CM | POA: Diagnosis not present

## 2018-02-01 DIAGNOSIS — G8929 Other chronic pain: Secondary | ICD-10-CM | POA: Diagnosis not present

## 2018-02-01 DIAGNOSIS — R7303 Prediabetes: Secondary | ICD-10-CM | POA: Diagnosis not present

## 2018-02-01 DIAGNOSIS — E039 Hypothyroidism, unspecified: Secondary | ICD-10-CM | POA: Diagnosis not present

## 2018-02-02 DIAGNOSIS — D225 Melanocytic nevi of trunk: Secondary | ICD-10-CM | POA: Diagnosis not present

## 2018-02-02 DIAGNOSIS — L304 Erythema intertrigo: Secondary | ICD-10-CM | POA: Diagnosis not present

## 2018-02-02 DIAGNOSIS — L821 Other seborrheic keratosis: Secondary | ICD-10-CM | POA: Diagnosis not present

## 2018-02-02 DIAGNOSIS — L814 Other melanin hyperpigmentation: Secondary | ICD-10-CM | POA: Diagnosis not present

## 2018-02-11 ENCOUNTER — Encounter: Payer: Self-pay | Admitting: Cardiovascular Disease

## 2018-02-14 ENCOUNTER — Ambulatory Visit (INDEPENDENT_AMBULATORY_CARE_PROVIDER_SITE_OTHER): Payer: Medicare Other | Admitting: *Deleted

## 2018-02-14 DIAGNOSIS — I482 Chronic atrial fibrillation, unspecified: Secondary | ICD-10-CM | POA: Diagnosis not present

## 2018-02-14 DIAGNOSIS — Z7901 Long term (current) use of anticoagulants: Secondary | ICD-10-CM

## 2018-02-14 DIAGNOSIS — Z5181 Encounter for therapeutic drug level monitoring: Secondary | ICD-10-CM

## 2018-02-14 LAB — POCT INR: INR: 2.7 (ref 2.0–3.0)

## 2018-02-14 NOTE — Patient Instructions (Signed)
Description   Continue on same dosage 1 tablet daily except 1.5 tablets on Mondays, Wednesdays and Fridays.  Recheck in 6 weeks.  Please call [phone] if you have any questions

## 2018-03-07 DIAGNOSIS — M170 Bilateral primary osteoarthritis of knee: Secondary | ICD-10-CM | POA: Diagnosis not present

## 2018-03-09 ENCOUNTER — Encounter: Payer: Self-pay | Admitting: Cardiovascular Disease

## 2018-03-09 ENCOUNTER — Ambulatory Visit: Payer: PPO | Admitting: Cardiovascular Disease

## 2018-03-09 VITALS — BP 134/72 | HR 92 | Ht 66.0 in | Wt 202.1 lb

## 2018-03-09 DIAGNOSIS — I4891 Unspecified atrial fibrillation: Secondary | ICD-10-CM

## 2018-03-09 DIAGNOSIS — I10 Essential (primary) hypertension: Secondary | ICD-10-CM

## 2018-03-09 MED ORDER — CARVEDILOL 25 MG PO TABS: 25.0000 mg | ORAL_TABLET | Freq: Two times a day (BID) | ORAL | 2 refills | Status: DC

## 2018-03-09 NOTE — Progress Notes (Signed)
Cardiology Office Note   Date:  03/09/2018   ID:  Kayla Hale, DOB [birth year], MRN [id number]  PCP:  Lind Guest, NP  Cardiologist:   Mertie Moores, MD   Chief Complaint  Patient presents with  . Atrial Fibrillation   1. Atrial fibrillation - CHADS VASC score = 3 2. Hypertension  Kayla Hale is a 73 yo who is referred for atrial fib. Hx of HTN.  She is basically a symptomatically she was found to have an irregular heart rate on A visit to Urgent care ( which was for a bladder infection) . EKG revealed atrial fibrillation. She does have occasional episodes of palpitations but he seemed to be more related to stress.  Nonsmoker Rare ETOH -  Fhx - father died of MI. ( age 75s), sister has CAd,,   April. 26, 2016: Kayla Hale is a 73 y.o. female who presents for  Atrial fib.  She's done very well. She's been keeping a record of her blood pressure readings and for the most part they are  In the normal  Range. She has not been cardioverted.   She has had atrial fib for many years.    12/24/2014:  She is doing well Is in the donut hole ( mostly from Eliquis )  No CP or dyspnea.  Oct. 31, 2017:  Kayla Hale is seen Today for follow-up of her hypertension and atrial fibrillation. Is not working anymore at [employer] ( previously worked at [employer] )  Exercising - walks every day for 30 minutes.     June 24, 2016:  Kayla Hale is seen for follow up .  Is working - hosting open houses on the weekends. . No CP or dyspnea.   Tries to walk every day for 30 minutes   March 09, 2017:  Doing well.  INR was 2.1 today .   Wants to change to a DOAC ,   She Will call her insurance co. Has gained some weight .  Walking 3 times a week .   Is watching her carbs ( recently )   Jan. 8, 2020  Kayla Hale is seen today .  Has persistent Afib.    Limited by knee pain .   Tries to walk or ride her bike 3 days a week .  No CP or dyspnea.     Past Medical History:  Diagnosis Date  . Atrial  fibrillation ([place])   . Chronic kidney disease    STAGE 2  . Diverticulosis 2004  . Dyslipidemia   . Gallstones   . GERD (gastroesophageal reflux disease)   . Hyperlipidemia   . Hypertension   . Hypothyroidism   . Mild anemia   . Mild obesity   . Seasonal allergies   . Shingles 2005   LOWER BACK  . Thyroid disease   . Urinary incontinence     Past Surgical History:  Procedure Laterality Date  . CHOLECYSTECTOMY N/A 03/16/2014   Procedure: LAPAROSCOPIC CHOLECYSTECTOMY WITH INTRAOPERATIVE CHOLANGIOGRAM;  Surgeon: Rolm Bookbinder, MD;  Location: [place];  Service: General;  Laterality: N/A;  . COLONOSCOPY    . TUBAL LIGATION       Current Outpatient Medications  Medication Sig Dispense Refill  . ALPRAZolam (XANAX) 0.5 MG tablet Take 0.5 mg by mouth See admin instructions. Takes 1 tablet by mouth daily    . Calcium Carbonate-Vitamin D (CALCIUM-VITAMIN D) 500-200 MG-UNIT per tablet Take 1 tablet by mouth daily.    . carvedilol (COREG) 25 MG  tablet Take 1 tablet (25 mg total) by mouth 2 (two) times daily. 180 tablet 2  . cholecalciferol (VITAMIN D) 1000 UNITS tablet Take 1,000 Units by mouth daily.    Kayla Hale Kitchen levothyroxine (SYNTHROID, LEVOTHROID) 75 MCG tablet Take 75 mcg by mouth daily before breakfast.    . Melatonin 200 MCG TABS Take 200 mcg by mouth at bedtime as needed.    . Multiple Vitamin (MULTIVITAMIN) tablet Take 1 tablet by mouth daily.    Kayla Hale Kitchen omeprazole (PRILOSEC) 20 MG capsule Take 20 mg by mouth daily.    Kayla Hale Kitchen oxybutynin (DITROPAN) 5 MG tablet Take 5 mg by mouth 3 (three) times daily.    . pravastatin (PRAVACHOL) 20 MG tablet Take 20 mg by mouth daily.    Kayla Hale Kitchen UNABLE TO FIND cbd oil qhs    . warfarin (COUMADIN) 5 MG tablet TAKE AS DIRECTED BY  COUMADIN CLINIC 120 tablet 1   No current facility-administered medications for this visit.     Allergies:   Lisinopril and Norvasc [amlodipine besylate]    Social History:  The patient  reports that she has never smoked. She has never  used smokeless tobacco. She reports that she does not drink alcohol or use drugs.   Family History:  The patient's family history includes CVA in her brother; Diabetes in her brother and sister; Heart attack in her father and sister; Hypertension in her father.    ROS: As per current history.  Otherwise negative.  Physical Exam: Blood pressure 134/72, pulse 92, height 5\' 6"  (1.676 m), weight 202 lb 1.9 oz (91.7 kg), SpO2 96 %.  GEN:  Well nourished, well developed in no acute distress HEENT: Normal NECK: No JVD; No carotid bruits LYMPHATICS: No lymphadenopathy CARDIAC:  Irreg. Irreg.  RESPIRATORY:  Clear to auscultation without rales, wheezing or rhonchi  ABDOMEN: Soft, non-tender, non-distended MUSCULOSKELETAL:  No edema; No deformity  SKIN: Warm and dry NEUROLOGIC:  Alert and oriented x 3   EKG:      March 09, 2018: Atrial fibrillation at a rate of 76.  No ST or T wave changes.    Recent Labs: No results found for requested labs within last 8760 hours.    Lipid Panel No results found for: CHOL, TRIG, HDL, CHOLHDL, VLDL, LDLCALC, LDLDIRECT    Wt Readings from Last 3 Encounters:  03/09/18 202 lb 1.9 oz (91.7 kg)  03/09/17 211 lb 12.8 oz (96.1 kg)  06/24/16 202 lb (91.6 kg)      Other studies Reviewed: Additional studies/ records that were reviewed today include: . Review of the above records demonstrates:    ASSESSMENT AND PLAN:  1. Atrial fibrillation - CHADS VASC score = 3   On coumadin  Rate is well controlled.    2. Hypertension -   BP is stable  Encouraged her to work on a diet, exercise, weight loss program.  Continue carvedilol.  3. Hyperlipidemia:   Managed by her primary medical doctor.  She will asked them to refill her Pravachol.  Current medicines are reviewed at length with the patient today.  The patient does not have concerns regarding medicines.  The following changes have been made:  no change  Labs/ tests ordered today include:  No  orders of the defined types were placed in this encounter.    Disposition:   FU with me in 1 year    Signed, Mertie Moores, MD  03/09/2018 3:01 PM    [address],  , [place]  [address] Phone: [phone]; Fax: [phone]  

## 2018-03-09 NOTE — Patient Instructions (Signed)
Your physician recommends that you continue on your current medications as directed. Please refer to the Current Medication list given to you today.   Your physician wants you to follow-up in: YEAR WITH DR NASHER You will receive a reminder letter in the mail two months in advance. If you don't receive a letter, please call our office to schedule the follow-up appointment.  

## 2018-03-28 ENCOUNTER — Ambulatory Visit: Payer: PPO | Admitting: *Deleted

## 2018-03-28 DIAGNOSIS — I4891 Unspecified atrial fibrillation: Secondary | ICD-10-CM

## 2018-03-28 DIAGNOSIS — Z5181 Encounter for therapeutic drug level monitoring: Secondary | ICD-10-CM | POA: Diagnosis not present

## 2018-03-28 DIAGNOSIS — I482 Chronic atrial fibrillation, unspecified: Secondary | ICD-10-CM

## 2018-03-28 DIAGNOSIS — Z7901 Long term (current) use of anticoagulants: Secondary | ICD-10-CM

## 2018-03-28 LAB — POCT INR: INR: 2.4 (ref 2.0–3.0)

## 2018-03-28 NOTE — Patient Instructions (Signed)
Description   Continue on same dosage 1 tablet daily except 1.5 tablets on Mondays, Wednesdays and Fridays.  Recheck in 6 weeks.  Please call [phone] if you have any questions

## 2018-04-05 ENCOUNTER — Other Ambulatory Visit: Payer: Self-pay | Admitting: Family

## 2018-04-05 DIAGNOSIS — Z1231 Encounter for screening mammogram for malignant neoplasm of breast: Secondary | ICD-10-CM

## 2018-05-03 ENCOUNTER — Ambulatory Visit: Payer: PPO

## 2018-05-09 ENCOUNTER — Ambulatory Visit (INDEPENDENT_AMBULATORY_CARE_PROVIDER_SITE_OTHER): Payer: PPO | Admitting: *Deleted

## 2018-05-09 DIAGNOSIS — Z7901 Long term (current) use of anticoagulants: Secondary | ICD-10-CM | POA: Diagnosis not present

## 2018-05-09 DIAGNOSIS — Z5181 Encounter for therapeutic drug level monitoring: Secondary | ICD-10-CM

## 2018-05-09 DIAGNOSIS — I482 Chronic atrial fibrillation, unspecified: Secondary | ICD-10-CM | POA: Diagnosis not present

## 2018-05-09 LAB — POCT INR: INR: 2.3 (ref 2.0–3.0)

## 2018-05-09 NOTE — Patient Instructions (Signed)
Description   Continue on same dosage 1 tablet daily except 1.5 tablets on Mondays, Wednesdays and Fridays.  Recheck in 6 weeks.  Please call [phone] if you have any questions

## 2018-05-25 ENCOUNTER — Ambulatory Visit: Payer: PPO

## 2018-06-17 ENCOUNTER — Telehealth: Payer: Self-pay

## 2018-06-17 NOTE — Telephone Encounter (Signed)
lmom for prescreen  

## 2018-06-17 NOTE — Telephone Encounter (Signed)
1. Do you currently have a fever? no (yes = cancel and refer to pcp for e-visit) 2. Have you recently travelled on a cruise, internationally, or to [place], [place], [place], [place], [place], or [place], [place] [employer]) ? No (yes = cancel, stay home, monitor symptoms, and contact pcp or initiate e-visit if symptoms develop) 3. Have you been in contact with someone that is currently pending confirmation of Covid19 testing or has been confirmed to have the [place] virus?  No (yes = cancel, stay home, away from tested individual, monitor symptoms, and contact pcp or initiate e-visit if symptoms develop) 4. Are you currently experiencing fatigue or cough? No (yes = pt should be prepared to have a mask placed at the time of their visit).  Pt. Advised that we are restricting visitors at this time and anyone present in the vehicle should meet the above criteria as well. Advised that visit will be at curbside for finger stick ONLY and will receive call with instructions. Pt also advised to please bring own pen for signature of arrival document.

## 2018-06-20 ENCOUNTER — Other Ambulatory Visit: Payer: Self-pay

## 2018-06-20 ENCOUNTER — Ambulatory Visit (INDEPENDENT_AMBULATORY_CARE_PROVIDER_SITE_OTHER): Payer: PPO | Admitting: Pharmacist

## 2018-06-20 DIAGNOSIS — I482 Chronic atrial fibrillation, unspecified: Secondary | ICD-10-CM | POA: Diagnosis not present

## 2018-06-20 DIAGNOSIS — Z5181 Encounter for therapeutic drug level monitoring: Secondary | ICD-10-CM

## 2018-06-20 DIAGNOSIS — Z7901 Long term (current) use of anticoagulants: Secondary | ICD-10-CM | POA: Diagnosis not present

## 2018-06-20 LAB — POCT INR: INR: 2.8 (ref 2.0–3.0)

## 2018-06-20 MED ORDER — APIXABAN 5 MG PO TABS: 5.0000 mg | ORAL_TABLET | Freq: Two times a day (BID) | ORAL | 1 refills | Status: DC

## 2018-06-21 DIAGNOSIS — J0140 Acute pansinusitis, unspecified: Secondary | ICD-10-CM | POA: Diagnosis not present

## 2018-06-21 DIAGNOSIS — Z6833 Body mass index (BMI) 33.0-33.9, adult: Secondary | ICD-10-CM | POA: Diagnosis not present

## 2018-06-27 ENCOUNTER — Ambulatory Visit: Payer: PPO

## 2018-06-29 DIAGNOSIS — Z1331 Encounter for screening for depression: Secondary | ICD-10-CM | POA: Diagnosis not present

## 2018-06-29 DIAGNOSIS — E669 Obesity, unspecified: Secondary | ICD-10-CM | POA: Diagnosis not present

## 2018-06-29 DIAGNOSIS — Z1211 Encounter for screening for malignant neoplasm of colon: Secondary | ICD-10-CM | POA: Diagnosis not present

## 2018-06-29 DIAGNOSIS — Z1231 Encounter for screening mammogram for malignant neoplasm of breast: Secondary | ICD-10-CM | POA: Diagnosis not present

## 2018-06-29 DIAGNOSIS — Z1339 Encounter for screening examination for other mental health and behavioral disorders: Secondary | ICD-10-CM | POA: Diagnosis not present

## 2018-06-29 DIAGNOSIS — E2839 Other primary ovarian failure: Secondary | ICD-10-CM | POA: Diagnosis not present

## 2018-06-29 DIAGNOSIS — Z23 Encounter for immunization: Secondary | ICD-10-CM | POA: Diagnosis not present

## 2018-06-29 DIAGNOSIS — Z0000 Encounter for general adult medical examination without abnormal findings: Secondary | ICD-10-CM | POA: Diagnosis not present

## 2018-06-29 DIAGNOSIS — Z1159 Encounter for screening for other viral diseases: Secondary | ICD-10-CM | POA: Diagnosis not present

## 2018-07-01 ENCOUNTER — Other Ambulatory Visit: Payer: Self-pay | Admitting: *Deleted

## 2018-07-01 NOTE — Patient Outreach (Signed)
[facility]) Care Management  07/01/2018  Kayla Hale March 29, 1945 [id number]   RN Health Coach sent welcome letter, HTA Packet and Advance directive. As a benefit of [employer].  Plan:  RN will follow up within  3 months  [place] Management [phone]

## 2018-08-05 DIAGNOSIS — Z79899 Other long term (current) drug therapy: Secondary | ICD-10-CM | POA: Diagnosis not present

## 2018-08-05 DIAGNOSIS — R7303 Prediabetes: Secondary | ICD-10-CM | POA: Diagnosis not present

## 2018-08-08 DIAGNOSIS — H6123 Impacted cerumen, bilateral: Secondary | ICD-10-CM | POA: Diagnosis not present

## 2018-08-08 DIAGNOSIS — Z6833 Body mass index (BMI) 33.0-33.9, adult: Secondary | ICD-10-CM | POA: Diagnosis not present

## 2018-08-08 DIAGNOSIS — E7800 Pure hypercholesterolemia, unspecified: Secondary | ICD-10-CM | POA: Diagnosis not present

## 2018-08-08 DIAGNOSIS — I10 Essential (primary) hypertension: Secondary | ICD-10-CM | POA: Diagnosis not present

## 2018-08-08 DIAGNOSIS — E039 Hypothyroidism, unspecified: Secondary | ICD-10-CM | POA: Diagnosis not present

## 2018-08-08 DIAGNOSIS — E669 Obesity, unspecified: Secondary | ICD-10-CM | POA: Diagnosis not present

## 2018-08-08 DIAGNOSIS — F419 Anxiety disorder, unspecified: Secondary | ICD-10-CM | POA: Diagnosis not present

## 2018-08-08 DIAGNOSIS — N3281 Overactive bladder: Secondary | ICD-10-CM | POA: Diagnosis not present

## 2018-08-16 ENCOUNTER — Other Ambulatory Visit: Payer: Self-pay

## 2018-08-16 ENCOUNTER — Other Ambulatory Visit: Payer: Self-pay | Admitting: Family

## 2018-08-16 ENCOUNTER — Ambulatory Visit
Admission: RE | Admit: 2018-08-16 | Discharge: 2018-08-16 | Disposition: A | Discharge status: Home / Self Care | Payer: PPO | Source: Ambulatory Visit | Attending: Family | Admitting: Family

## 2018-08-16 DIAGNOSIS — E2839 Other primary ovarian failure: Secondary | ICD-10-CM

## 2018-08-16 DIAGNOSIS — Z1231 Encounter for screening mammogram for malignant neoplasm of breast: Secondary | ICD-10-CM | POA: Diagnosis not present

## 2018-08-22 ENCOUNTER — Other Ambulatory Visit: Payer: Self-pay | Admitting: *Deleted

## 2018-08-22 NOTE — Patient Outreach (Signed)
[facility]) Care Management  08/22/2018  Kayla Hale 1946/01/27 [id number]  RN Health Coach is closing this program. Consumer is enrolled in [place] [facility] external program.  [facility] Care Management [phone]

## 2018-08-31 ENCOUNTER — Ambulatory Visit: Payer: PPO | Admitting: *Deleted

## 2018-09-21 DIAGNOSIS — H6120 Impacted cerumen, unspecified ear: Secondary | ICD-10-CM | POA: Diagnosis not present

## 2018-09-21 DIAGNOSIS — Z6833 Body mass index (BMI) 33.0-33.9, adult: Secondary | ICD-10-CM | POA: Diagnosis not present

## 2018-10-28 ENCOUNTER — Other Ambulatory Visit: Payer: Self-pay

## 2018-10-28 MED ORDER — APIXABAN 5 MG PO TABS: 5.0000 mg | ORAL_TABLET | Freq: Two times a day (BID) | ORAL | 1 refills | Status: DC

## 2018-10-28 NOTE — Telephone Encounter (Signed)
Eliquis 5mg  refill request received, pt is 73 yrs old, weight-91.7kg, Crea-0.8 on 08/05/18 via [facility] in [place], Diagnosis-afib, and last seen by Dr Angelena Form on 06/20/18 . Dose is appropriate based on dosing criteria. Will send in refill to requested pharmacy.

## 2018-11-09 ENCOUNTER — Other Ambulatory Visit: Payer: Self-pay

## 2018-11-09 ENCOUNTER — Telehealth: Payer: Self-pay | Admitting: Cardiovascular Disease

## 2018-11-09 MED ORDER — APIXABAN 5 MG PO TABS: 5.0000 mg | ORAL_TABLET | Freq: Two times a day (BID) | ORAL | 1 refills | Status: DC

## 2018-11-09 NOTE — Telephone Encounter (Signed)
New message   Pt c/o medication issue:  1. Name of Medication:  apixaban (ELIQUIS) 5 MG TABS tablet     2. How are you currently taking this medication (dosage and times per day)? Twice daily  3. Are you having a reaction (difficulty breathing--STAT)? n/a  4. What is your medication issue? Patient states that she can't afford this medication. Please call to discuss a substitute. Patient states that she has 41 pills left and wants to know if she should take the remainder of the pills.

## 2018-11-09 NOTE — Telephone Encounter (Signed)
Pt last saw Dr Acie Fredrickson 03/09/18, last labs 08/05/18 Creat 0.8 at [place], age 73, weight 91.7kg, based on specified criteria pt is on appropriate dosage of Eliquis 5mg  BID.  Will refill rx.

## 2018-11-14 NOTE — Telephone Encounter (Signed)
**Note De-Identified Kayla Hale Obfuscation** The pt is advised to call [facility] to ask questions concerning their Pt Asst program and to ask them to mail her an application if she is eligable. She is aware to bring her completed application and all required documents to the office so we can complete the provider part of her application and fax all to [facility] Pt Asst program.  She thanked me for calling her back.

## 2018-11-14 NOTE — Telephone Encounter (Signed)
Patient was returning a call from our office. She did not get a name of who called her. She did reach out to the office last week in regards to the price of her eliquis, and she was hoping someone was calling about that. Please advise

## 2018-11-14 NOTE — Telephone Encounter (Signed)
**Note De-Identified Kayla Hale Obfuscation** LMTCB on the pts VM.  I did call [facility] to get the cost that the pt is paying for Eliquis but was advised that it is too soon to refill so they cannot get the price at this time and that they did not fill her last refill so they do not know what she paid then.  I called [facility] but was on hold for almost 10 mins. I will discuss with the pt when she returns my call.

## 2018-11-25 ENCOUNTER — Telehealth: Payer: Self-pay

## 2018-11-25 NOTE — Telephone Encounter (Signed)
**Note De-Identified Siyah Mault Obfuscation** The pts completed [facility] pt asst application for Eliquis was left at the office. We have completed the provider part of the application, Dr Lovena Le (DOD) has signed it and we have faxed it to [facility] pt asst program.

## 2018-11-30 NOTE — Telephone Encounter (Signed)
**Note De-Identified Setareh Rom Obfuscation** Letter received from [facility] stating that they have approved the pt for asst with her Eliquis. Approval good until 03/02/2019. Case# [id number]  The letter states that they have notified the pt of this approval as well.

## 2019-01-18 DIAGNOSIS — Z03818 Encounter for observation for suspected exposure to other biological agents ruled out: Secondary | ICD-10-CM | POA: Diagnosis not present

## 2019-01-25 ENCOUNTER — Other Ambulatory Visit: Payer: Self-pay

## 2019-02-13 DIAGNOSIS — L814 Other melanin hyperpigmentation: Secondary | ICD-10-CM | POA: Diagnosis not present

## 2019-02-13 DIAGNOSIS — L821 Other seborrheic keratosis: Secondary | ICD-10-CM | POA: Diagnosis not present

## 2019-02-13 DIAGNOSIS — L570 Actinic keratosis: Secondary | ICD-10-CM | POA: Diagnosis not present

## 2019-02-21 DIAGNOSIS — E7800 Pure hypercholesterolemia, unspecified: Secondary | ICD-10-CM | POA: Diagnosis not present

## 2019-02-21 DIAGNOSIS — I10 Essential (primary) hypertension: Secondary | ICD-10-CM | POA: Diagnosis not present

## 2019-02-21 DIAGNOSIS — K219 Gastro-esophageal reflux disease without esophagitis: Secondary | ICD-10-CM | POA: Diagnosis not present

## 2019-02-21 DIAGNOSIS — R197 Diarrhea, unspecified: Secondary | ICD-10-CM | POA: Diagnosis not present

## 2019-02-21 DIAGNOSIS — F419 Anxiety disorder, unspecified: Secondary | ICD-10-CM | POA: Diagnosis not present

## 2019-02-21 DIAGNOSIS — R7303 Prediabetes: Secondary | ICD-10-CM | POA: Diagnosis not present

## 2019-02-21 DIAGNOSIS — Z79899 Other long term (current) drug therapy: Secondary | ICD-10-CM | POA: Diagnosis not present

## 2019-02-21 DIAGNOSIS — N3281 Overactive bladder: Secondary | ICD-10-CM | POA: Diagnosis not present

## 2019-02-21 DIAGNOSIS — E669 Obesity, unspecified: Secondary | ICD-10-CM | POA: Diagnosis not present

## 2019-02-21 DIAGNOSIS — Z1159 Encounter for screening for other viral diseases: Secondary | ICD-10-CM | POA: Diagnosis not present

## 2019-02-21 DIAGNOSIS — E039 Hypothyroidism, unspecified: Secondary | ICD-10-CM | POA: Diagnosis not present

## 2019-03-14 ENCOUNTER — Other Ambulatory Visit: Payer: Self-pay

## 2019-03-14 ENCOUNTER — Ambulatory Visit: Payer: PPO | Admitting: Cardiovascular Disease

## 2019-03-14 ENCOUNTER — Encounter: Payer: Self-pay | Admitting: Cardiovascular Disease

## 2019-03-14 VITALS — BP 128/82 | HR 78 | Ht 66.0 in | Wt 199.8 lb

## 2019-03-14 DIAGNOSIS — Z5181 Encounter for therapeutic drug level monitoring: Secondary | ICD-10-CM | POA: Diagnosis not present

## 2019-03-14 DIAGNOSIS — I10 Essential (primary) hypertension: Secondary | ICD-10-CM | POA: Diagnosis not present

## 2019-03-14 DIAGNOSIS — I482 Chronic atrial fibrillation, unspecified: Secondary | ICD-10-CM | POA: Diagnosis not present

## 2019-03-14 MED ORDER — METOPROLOL TARTRATE 50 MG PO TABS: 50.0000 mg | ORAL_TABLET | Freq: Two times a day (BID) | ORAL | 3 refills | Status: DC

## 2019-03-14 NOTE — Progress Notes (Signed)
Cardiology Office Note   Date:  03/14/2019   ID:  Kayla Hale, Kayla Hale [birth year], MRN [id number]  PCP:  Lind Guest, NP  Cardiologist:   Mertie Moores, MD   Chief Complaint  Patient presents with  . Atrial Fibrillation  . Hypertension   1. Atrial fibrillation - CHADS VASC score = 3 2. Hypertension  [name] is a 74 yo who is referred for atrial fib. Hx of HTN.  She is basically a symptomatically she was found to have an irregular heart rate on A visit to Urgent care ( which was for a bladder infection) . EKG revealed atrial fibrillation. She does have occasional episodes of palpitations but he seemed to be more related to stress.  Nonsmoker Rare ETOH -  Fhx - father died of MI. ( age 4s), sister has CAd,,   April. 26, 2016: Kayla Hale is a 74 y.o. female who presents for  Atrial fib.  She's done very well. She's been keeping a record of her blood pressure readings and for the most part they are  In the normal  Range. She has not been cardioverted.   She has had atrial fib for many years.    12/24/2014:  She is doing well Is in the donut hole ( mostly from Eliquis )  No CP or dyspnea.  Oct. 31, 2017:  Kayla Hale is seen Today for follow-up of her hypertension and atrial fibrillation. Is not working anymore at [employer] ( previously worked at [employer] )  Exercising - walks every day for 30 minutes.     June 24, 2016:  Kayla Hale is seen for follow up .  Is working - hosting open houses on the weekends. . No CP or dyspnea.   Tries to walk every day for 30 minutes   March 09, 2017:  Doing well.  INR was 2.1 today .   Wants to change to a DOAC ,   She Will call her insurance co. Has gained some weight .  Walking 3 times a week .   Is watching her carbs ( recently )   Jan. 8, 2020  Kayla Hale is seen today .  Has persistent Afib.    Limited by knee pain .   Tries to walk or ride her bike 3 days a week .  No CP or dyspnea.   Jan. 12, 2021;  Kayla Hale is seen today  for follow up of her atrial fib  Feeling well.   Tries to work out 3 times a week  HR remains elevated.    Past Medical History:  Diagnosis Date  . Atrial fibrillation ([place])   . Chronic kidney disease    STAGE 2  . Diverticulosis 2004  . Dyslipidemia   . Gallstones   . GERD (gastroesophageal reflux disease)   . Hyperlipidemia   . Hypertension   . Hypothyroidism   . Mild anemia   . Mild obesity   . Seasonal allergies   . Shingles 2005   LOWER BACK  . Thyroid disease   . Urinary incontinence     Past Surgical History:  Procedure Laterality Date  . CHOLECYSTECTOMY N/A 03/16/2014   Procedure: LAPAROSCOPIC CHOLECYSTECTOMY WITH INTRAOPERATIVE CHOLANGIOGRAM;  Surgeon: Rolm Bookbinder, MD;  Location: [facility];  Service: General;  Laterality: N/A;  . COLONOSCOPY    . TUBAL LIGATION       Current Outpatient Medications  Medication Sig Dispense Refill  . ALPRAZolam (XANAX) 0.5 MG tablet Take 0.5 mg by mouth  daily.     . apixaban (ELIQUIS) 5 MG TABS tablet Take 1 tablet (5 mg total) by mouth 2 (two) times daily. 180 tablet 1  . Ascorbic Acid (VITAMIN C) 1000 MG tablet Take 1,000 mg by mouth daily.    . Calcium Carbonate-Vitamin D (CALCIUM-VITAMIN D) 500-200 MG-UNIT per tablet Take 1 tablet by mouth daily.    . cholecalciferol (VITAMIN D) 1000 UNITS tablet Take 1,000 Units by mouth daily.    Marland Kitchen levothyroxine (SYNTHROID, LEVOTHROID) 75 MCG tablet Take 75 mcg by mouth daily before breakfast.    . Melatonin 200 MCG TABS Take 200 mcg by mouth at bedtime as needed.    . Multiple Vitamin (MULTIVITAMIN) tablet Take 1 tablet by mouth daily.    Marland Kitchen omeprazole (PRILOSEC) 20 MG capsule Take 20 mg by mouth daily.    Marland Kitchen oxybutynin (DITROPAN) 5 MG tablet Take 5 mg by mouth 3 (three) times daily.    . pravastatin (PRAVACHOL) 20 MG tablet Take 20 mg by mouth daily.    Marland Kitchen UNABLE TO FIND cbd oil qhs    . zinc gluconate 50 MG tablet Take 50 mg by mouth daily.    . metoprolol tartrate (LOPRESSOR) 50 MG  tablet Take 1 tablet (50 mg total) by mouth 2 (two) times daily. 180 tablet 3   No current facility-administered medications for this visit.    Allergies:   Lisinopril and Norvasc [amlodipine besylate]    Social History:  The patient  reports that she has never smoked. She has never used smokeless tobacco. She reports that she does not drink alcohol or use drugs.   Family History:  The patient's family history includes CVA in her brother; Diabetes in her brother and sister; Heart attack in her father and sister; Hypertension in her father.    ROS: As per current history.  Otherwise negative.  Physical Exam: Blood pressure 128/82, pulse 78, height 5\' 6"  (1.676 m), weight 199 lb 12.8 oz (90.6 kg), SpO2 97 %.  GEN:  Elderly female,  NAD  HEENT: Normal NECK: No JVD; No carotid bruits LYMPHATICS: No lymphadenopathy CARDIAC: irreg. Irreg.  RESPIRATORY:  Clear to auscultation without rales, wheezing or rhonchi  ABDOMEN: Soft, non-tender, non-distended MUSCULOSKELETAL:  No edema; No deformity  SKIN: Warm and dry NEUROLOGIC:  Alert and oriented x 3   EKG:      Jan. 12, 2021:   Afib at 99.  No ST / T wave changes.    Recent Labs: No results found for requested labs within last 8760 hours.    Lipid Panel No results found for: CHOL, TRIG, HDL, CHOLHDL, VLDL, LDLCALC, LDLDIRECT    Wt Readings from Last 3 Encounters:  03/14/19 199 lb 12.8 oz (90.6 kg)  03/09/18 202 lb 1.9 oz (91.7 kg)  03/09/17 211 lb 12.8 oz (96.1 kg)      Other studies Reviewed: Additional studies/ records that were reviewed today include: . Review of the above records demonstrates:    ASSESSMENT AND PLAN:  1. Atrial fibrillation - CHADS VASC score = 3 On eliquis .   HR is a bit on the high side.  We will discontinue the carvedilol and start her on metoprolol 50 mg twice a day.  We will continue to titrate the metoprolol as needed for rate control.  2. Hypertension -we will be changing the carvedilol to  metoprolol as noted above.  I have advised her to continue to work very hard on a diet, exercise, weight loss program.  3. Hyperlipidemia:   Managed by her primary medical doctor. Current medicines are reviewed at length with the patient today.  The patient does not have concerns regarding medicines.  The following changes have been made:  no change  Labs/ tests ordered today include:   Orders Placed This Encounter  Procedures  . EKG 12-Lead     Disposition:   FU with an APP in 6 months    Signed, Mertie Moores, MD  03/14/2019 5:43 PM    [facility] [place] [address], [place], [place]  [address] Phone: [phone]; Fax: [phone]

## 2019-03-14 NOTE — Patient Instructions (Signed)
Medication Instructions:  Your physician has recommended you make the following change in your medication:  STOP Carvedilol (Coreg) START Metoprolol tartrate (Lopressor) 50 mg twice daily  *If you need a refill on your cardiac medications before your next appointment, please call your pharmacy*  Lab Work: None Ordered If you have labs (blood work) drawn today and your tests are completely normal, you will receive your results only by: Marland Kitchen MyChart Message (if you have MyChart) OR . A paper copy in the mail If you have any lab test that is abnormal or we need to change your treatment, we will call you to review the results.   Testing/Procedures: None Ordered   Follow-Up: At [facility], you and your health needs are our priority.  As part of our continuing mission to provide you with exceptional heart care, we have created designated Provider Care Teams.  These Care Teams include your primary Cardiologist (physician) and Advanced Practice Providers (APPs -  Physician Assistants and Nurse Practitioners) who all work together to provide you with the care you need, when you need it.  Your next appointment:   6 month(s)  The format for your next appointment:   Either In Person or Virtual  Provider:   Richardson Dopp, PA-C, Robbie Lis, PA-C or Daune Perch, NP

## 2019-03-15 DIAGNOSIS — R519 Headache, unspecified: Secondary | ICD-10-CM | POA: Diagnosis not present

## 2019-03-15 DIAGNOSIS — J0100 Acute maxillary sinusitis, unspecified: Secondary | ICD-10-CM | POA: Diagnosis not present

## 2019-06-06 ENCOUNTER — Telehealth: Payer: Self-pay | Admitting: Cardiovascular Disease

## 2019-06-06 NOTE — Telephone Encounter (Signed)
Patient states that her feet have been swollen since Sunday. She is not having any shortness of breath. She is not sure about weight gain. She states that she has been eating more salt recently. Advised patient to decrease salt in her diet and to elevate her feet. She will let us know if there if swelling does not improve or worsens with additional symptoms. I advised her to be aware of gaining 3 lbs in one day or 5 lbs in one week. Patient verbalized understanding and also states that she prefers not to have to take a diuretic.

## 2019-06-06 NOTE — Telephone Encounter (Signed)
Pt c/o swelling: STAT is pt has developed SOB within 24 hours  1) How much weight have you gained and in what time span? Does not believe she has gained weight  2) If swelling, where is the swelling located? Legs and feet, since Sunday   3) Are you currently taking a fluid pill? no  4) Are you currently SOB? no  5) Do you have a log of your daily weights (if so, list)? no  6) Have you gained 3 pounds in a day or 5 pounds in a week? no  7) Have you traveled recently? No  Patient states she noticed her feet and legs swelling Sunday. She states she is not having any other symptoms and does not think she has gained any weight. She states in January her medications changed and is not sure if it is causing the swelling. Please advise.

## 2019-06-08 NOTE — Telephone Encounter (Signed)
It sounds like the excess salt is causing the leg swelling .  She should greatly reduce the salt intake ,  cont  current meds  We can address further if the edema continues.

## 2019-06-12 ENCOUNTER — Other Ambulatory Visit: Payer: Self-pay | Admitting: *Deleted

## 2019-06-12 ENCOUNTER — Telehealth: Payer: Self-pay | Admitting: Cardiovascular Disease

## 2019-06-12 MED ORDER — APIXABAN 5 MG PO TABS: 5.0000 mg | ORAL_TABLET | Freq: Two times a day (BID) | ORAL | 1 refills | Status: DC

## 2019-06-12 NOTE — Telephone Encounter (Signed)
Remind her to not eat any salty foods.   I recommend leg elevation ( Loung Doctor leg rest - available at [facility]) Compression hose Increase ambulaton

## 2019-06-12 NOTE — Telephone Encounter (Signed)
Patient is returning Michelle's call.

## 2019-06-12 NOTE — Telephone Encounter (Signed)
Left message for patient to call back  

## 2019-06-12 NOTE — Telephone Encounter (Signed)
Left detailed message with Dr. Elmarie Shiley advice and that he would like her to try these things before prescribing a diuretic. I advised that I will call back later today or tomorrow to discuss or that she may call the office with questions.

## 2019-06-12 NOTE — Telephone Encounter (Signed)
Spoke with patient who states she continues to have bilateral foot swelling that makes it difficult to walk. She called in last week to report the increased swelling after working in her yard for several hours. States she went to at wedding this weekend 3 hours away but she has worked on elevating her feet when she was able. I asked about walking up stairs and walking long distances and she states her knees bother her but she took a 2 mile walk at the [place] and needed to rest on the way back.  Worked in her yard last week for several hours which is when the symptoms initially began. She has had some fluctuation in weight with a gain and then loss of about 4 lbs. I advised that I will discuss with Dr. Acie Fredrickson and call her back with his advice. She verbalized understanding and agreement. Recent lab work from patient's [facility]: BUN 17.000 M 02/21/2019 Creatinine, Serum 0.870 MG/ 02/21/2019 TSH 1.580

## 2019-06-12 NOTE — Telephone Encounter (Signed)
Pt c/o swelling: STAT is pt has developed SOB within 24 hours   1) How much weight have you  gained and in what time span?  Patient states last week she gained  3-4 lbs when her foot swelling  worsened. However, her weight  has gone back down to average of  205 lbs since.  1) If swelling, where is the swelling located? Feet  2) Are you currently taking a fluid pill? No  3) Are you currently SOB? No  4) Do you have a log of your daily weights (if so, list)? No  5) Have you gained 3 pounds in a day or 5 pounds in a week? No  6) Have you traveled recently? No

## 2019-06-12 NOTE — Telephone Encounter (Signed)
Prescription refill request for Eliquis received.  Last office visit: [place], 03/14/2019 Scr: 0.87, 02/21/2019 Age: 74 y.o. Weight: 90.6 kg    Prescription refill sent.

## 2019-06-12 NOTE — Telephone Encounter (Signed)
Left message for patient that I will call her back before I leave for the day with Dr. Elmarie Shiley advice.

## 2019-06-13 ENCOUNTER — Ambulatory Visit: Payer: PPO | Admitting: Cardiovascular Disease

## 2019-06-13 NOTE — Telephone Encounter (Signed)
Spoke with patient who states her leg swelling has already improved significantly since yesterday. States she elevated her legs last night and they are less swollen today. She agrees to get some compression stockings and will look at the lounge doctor as an option or will continue to use multiple pillows. She agrees to call back prior to her next office visit with worsening symptoms or other concerns. She thanked me for the call.

## 2019-06-16 ENCOUNTER — Telehealth: Payer: Self-pay | Admitting: Cardiovascular Disease

## 2019-06-16 NOTE — Telephone Encounter (Signed)
Pt c/o medication issue:  1. Name of Medication:   metoprolol tartrate (LOPRESSOR) 50 MG tablet    2. How are you currently taking this medication (dosage and times per day)?   3. Are you having a reaction (difficulty breathing--STAT)?   4. What is your medication issue?Kayla Hale The pharmacist called and says the patient called for a refill but their records show this medication as being discontinued. Please clarify orders for the pharmacy

## 2019-06-19 NOTE — Telephone Encounter (Signed)
Called pt's pharmacy and this matter has been taken care of. Pt's medication is being process. Confirmation received.

## 2019-06-22 ENCOUNTER — Other Ambulatory Visit: Payer: Self-pay | Admitting: Cardiovascular Disease

## 2019-06-22 MED ORDER — METOPROLOL TARTRATE 50 MG PO TABS: 50.0000 mg | ORAL_TABLET | Freq: Two times a day (BID) | ORAL | 0 refills | Status: DC

## 2019-06-22 NOTE — Telephone Encounter (Signed)
Pt's medication was sent to pt's pharmacy as requested. Confirmation received.  °

## 2019-06-22 NOTE — Telephone Encounter (Signed)
  *  STAT* If patient is at the pharmacy, call can be transferred to refill team.   1. Which medications need to be refilled? (please list name of each medication and dose if known)   metoprolol tartrate (LOPRESSOR) 50 MG tablet    2. Which pharmacy/location (including street and city if local pharmacy) is medication to be sent to? [place], [place]  3. Do they need a 30 day or 90 day supply? 30 days  Pt still waiting her 90 days supply from her mail order. She ran out of meds and need it today.

## 2019-06-23 ENCOUNTER — Other Ambulatory Visit: Payer: Self-pay

## 2019-06-23 ENCOUNTER — Ambulatory Visit: Payer: PPO | Admitting: Cardiovascular Disease

## 2019-06-23 ENCOUNTER — Encounter: Payer: Self-pay | Admitting: Cardiovascular Disease

## 2019-06-23 VITALS — BP 148/76 | HR 74 | Ht 66.0 in | Wt 204.0 lb

## 2019-06-23 DIAGNOSIS — I4891 Unspecified atrial fibrillation: Secondary | ICD-10-CM

## 2019-06-23 DIAGNOSIS — I10 Essential (primary) hypertension: Secondary | ICD-10-CM | POA: Diagnosis not present

## 2019-06-23 DIAGNOSIS — E782 Mixed hyperlipidemia: Secondary | ICD-10-CM | POA: Diagnosis not present

## 2019-06-23 NOTE — Progress Notes (Signed)
Cardiology Office Note   Date:  06/23/2019   ID:  Kayla Hale, Kayla Hale 22-Sep-1945, MRN [id number]  PCP:  Lind Guest, NP  Cardiologist:   Mertie Moores, MD   Chief Complaint  Patient presents with  . Hypertension  . Atrial Fibrillation   1. Atrial fibrillation - CHADS VASC score = 3 2. Hypertension  Kayla Hale is a 74 yo who is referred for atrial fib. Hx of HTN.  She is basically a symptomatically she was found to have an irregular heart rate on A visit to Urgent care ( which was for a bladder infection) . EKG revealed atrial fibrillation. She does have occasional episodes of palpitations but he seemed to be more related to stress.  Nonsmoker Rare ETOH -  Fhx - father died of MI. ( age 72s), sister has CAd,,   April. 26, 2016: Kayla Hale is a 74 y.o. female who presents for  Atrial fib.  She's done very well. She's been keeping a record of her blood pressure readings and for the most part they are  In the normal  Range. She has not been cardioverted.   She has had atrial fib for many years.    12/24/2014:  She is doing well Is in the donut hole ( mostly from Eliquis )  No CP or dyspnea.  Oct. 31, 2017:  Kayla Hale is seen Today for follow-up of her hypertension and atrial fibrillation. Is not working anymore at [employer] ( previously worked at [employer] )  Exercising - walks every day for 30 minutes.     June 24, 2016:  Kayla Hale is seen for follow up .  Is working - hosting open houses on the weekends. . No CP or dyspnea.   Tries to walk every day for 30 minutes   March 09, 2017:  Doing well.  INR was 2.1 today .   Wants to change to a DOAC ,   She Will call her insurance co. Has gained some weight .  Walking 3 times a week .   Is watching her carbs ( recently )   Jan. 8, 2020  Kayla Hale is seen today .  Has persistent Afib.    Limited by knee pain .   Tries to walk or ride her bike 3 days a week .  No CP or dyspnea.   Jan. 12, 2021;  Kayla Hale is seen today  for follow up of her atrial fib  Feeling well.   Tries to work out 3 times a week  HR remains elevated.   June 23, 2019: Kayla Hale is seen today for follow-up of her atrial fibrillation.  She has been having some leg swelling and presents today for further evaluation. Had some ham for easter.  Also ate hot dogs.  Developed the foot swelling 2 days after that.  Swelling has gone down .  Avoids salt for the most part .  Does not want to start a diuretic because she is on Ditropan   Past Medical History:  Diagnosis Date  . Atrial fibrillation ([place])   . Chronic kidney disease    STAGE 2  . Diverticulosis 2004  . Dyslipidemia   . Gallstones   . GERD (gastroesophageal reflux disease)   . Hyperlipidemia   . Hypertension   . Hypothyroidism   . Mild anemia   . Mild obesity   . Seasonal allergies   . Shingles 2005   LOWER BACK  . Thyroid disease   . Urinary incontinence  Past Surgical History:  Procedure Laterality Date  . CHOLECYSTECTOMY N/A 03/16/2014   Procedure: LAPAROSCOPIC CHOLECYSTECTOMY WITH INTRAOPERATIVE CHOLANGIOGRAM;  Surgeon: Rolm Bookbinder, MD;  Location: [facility];  Service: General;  Laterality: N/A;  . COLONOSCOPY    . TUBAL LIGATION       Current Outpatient Medications  Medication Sig Dispense Refill  . ALPRAZolam (XANAX) 0.5 MG tablet Take 0.5 mg by mouth daily.     Marland Kitchen apixaban (ELIQUIS) 5 MG TABS tablet Take 1 tablet (5 mg total) by mouth 2 (two) times daily. 180 tablet 1  . Ascorbic Acid (VITAMIN C) 1000 MG tablet Take 1,000 mg by mouth daily.    . Calcium Carbonate-Vitamin D (CALCIUM-VITAMIN D) 500-200 MG-UNIT per tablet Take 1 tablet by mouth daily.    . cholecalciferol (VITAMIN D) 1000 UNITS tablet Take 1,000 Units by mouth daily.    . fluticasone (FLONASE) 50 MCG/ACT nasal spray Place 1 spray into both nostrils daily as needed.    Marland Kitchen levothyroxine (SYNTHROID, LEVOTHROID) 75 MCG tablet Take 75 mcg by mouth daily before breakfast.    . metoprolol tartrate  (LOPRESSOR) 50 MG tablet Take 1 tablet (50 mg total) by mouth 2 (two) times daily. 60 tablet 0  . Multiple Vitamin (MULTIVITAMIN) tablet Take 1 tablet by mouth daily.    Marland Kitchen omeprazole (PRILOSEC) 20 MG capsule Take 20 mg by mouth daily.    Marland Kitchen oxybutynin (DITROPAN) 5 MG tablet Take 5 mg by mouth 3 (three) times daily.    . pravastatin (PRAVACHOL) 20 MG tablet Take 20 mg by mouth daily.    Marland Kitchen UNABLE TO FIND cbd oil qhs    . zinc gluconate 50 MG tablet Take 50 mg by mouth daily.     No current facility-administered medications for this visit.    Allergies:   Lisinopril and Norvasc [amlodipine besylate]    Social History:  The patient  reports that she has never smoked. She has never used smokeless tobacco. She reports that she does not drink alcohol or use drugs.   Family History:  The patient's family history includes CVA in her brother; Diabetes in her brother and sister; Heart attack in her father and sister; Hypertension in her father.    ROS: As per current history.  Otherwise negative.  Physical Exam: Blood pressure (!) 148/76, pulse 74, height 5\' 6"  (1.676 m), weight 204 lb (92.5 kg), SpO2 98 %.  GEN:  Well nourished, well developed in no acute distress HEENT: Normal NECK: No JVD; No carotid bruits LYMPHATICS: No lymphadenopathy CARDIAC: RRR , no murmurs, rubs, gallops RESPIRATORY:  Clear to auscultation without rales, wheezing or rhonchi  ABDOMEN: Soft, non-tender, non-distended MUSCULOSKELETAL:  No edema; No deformity  SKIN: Warm and dry NEUROLOGIC:  Alert and oriented x 3   EKG:          Recent Labs: No results found for requested labs within last 8760 hours.    Lipid Panel No results found for: CHOL, TRIG, HDL, CHOLHDL, VLDL, LDLCALC, LDLDIRECT    Wt Readings from Last 3 Encounters:  06/23/19 204 lb (92.5 kg)  03/14/19 199 lb 12.8 oz (90.6 kg)  03/09/18 202 lb 1.9 oz (91.7 kg)      Other studies Reviewed: Additional studies/ records that were reviewed today  include: . Review of the above records demonstrates:    ASSESSMENT AND PLAN:  1. Atrial fibrillation - CHADS VASC score = 3 On eliquis .     Stable,   No issues  2. Hypertension - . BP is mildly elevated.  Ate ham and hot dogs for easter and developed leg edema  Does not want to start a diuretic because she already has issues with urinary frequency and is on Ditropan.  3. Hyperlipidemia:    Labs have been stable.  She will follow up with my APP in 6 months.  The following changes have been made:  no change  Labs/ tests ordered today include:   No orders of the defined types were placed in this encounter.    Disposition:   FU with an APP in 6 months    Signed, Mertie Moores, MD  06/23/2019 12:05 PM    [facility] [address], [place], [place]  [address] Phone: [phone]; Fax: [phone]

## 2019-06-23 NOTE — Patient Instructions (Signed)
Medication Instructions:  Your physician recommends that you continue on your current medications as directed. Please refer to the Current Medication list given to you today.  *If you need a refill on your cardiac medications before your next appointment, please call your pharmacy*   Lab Work: None Ordered If you have labs (blood work) drawn today and your tests are completely normal, you will receive your results only by: . MyChart Message (if you have MyChart) OR . A paper copy in the mail If you have any lab test that is abnormal or we need to change your treatment, we will call you to review the results.   Testing/Procedures: None Ordered   Follow-Up: At [facility], you and your health needs are our priority.  As part of our continuing mission to provide you with exceptional heart care, we have created designated Provider Care Teams.  These Care Teams include your primary Cardiologist (physician) and Advanced Practice Providers (APPs -  Physician Assistants and Nurse Practitioners) who all work together to provide you with the care you need, when you need it.  We recommend signing up for the patient portal called "MyChart".  Sign up information is provided on this After Visit Summary.  MyChart is used to connect with patients for Virtual Visits (Telemedicine).  Patients are able to view lab/test results, encounter notes, upcoming appointments, etc.  Non-urgent messages can be sent to your provider as well.   To learn more about what you can do with MyChart, go to [email or web address].    Your next appointment:   6 month(s)  The format for your next appointment:   Either In Person or Virtual  Provider:   Scott Weaver, PA-C or Vin Bhagat, PA-C    

## 2019-07-06 DIAGNOSIS — K529 Noninfective gastroenteritis and colitis, unspecified: Secondary | ICD-10-CM | POA: Diagnosis not present

## 2019-07-06 DIAGNOSIS — M79671 Pain in right foot: Secondary | ICD-10-CM | POA: Diagnosis not present

## 2019-07-11 DIAGNOSIS — S9031XA Contusion of right foot, initial encounter: Secondary | ICD-10-CM | POA: Diagnosis not present

## 2019-07-14 ENCOUNTER — Other Ambulatory Visit: Payer: Self-pay | Admitting: Family

## 2019-07-14 DIAGNOSIS — Z1231 Encounter for screening mammogram for malignant neoplasm of breast: Secondary | ICD-10-CM

## 2019-08-17 ENCOUNTER — Other Ambulatory Visit: Payer: Self-pay

## 2019-08-17 ENCOUNTER — Ambulatory Visit
Admission: RE | Admit: 2019-08-17 | Discharge: 2019-08-17 | Disposition: A | Discharge status: Home / Self Care | Payer: PPO | Source: Ambulatory Visit | Attending: Family | Admitting: Family

## 2019-08-17 DIAGNOSIS — Z1231 Encounter for screening mammogram for malignant neoplasm of breast: Secondary | ICD-10-CM

## 2019-08-21 DIAGNOSIS — M170 Bilateral primary osteoarthritis of knee: Secondary | ICD-10-CM | POA: Diagnosis not present

## 2019-08-22 DIAGNOSIS — I10 Essential (primary) hypertension: Secondary | ICD-10-CM | POA: Diagnosis not present

## 2019-08-22 DIAGNOSIS — N3281 Overactive bladder: Secondary | ICD-10-CM | POA: Diagnosis not present

## 2019-08-22 DIAGNOSIS — Z79899 Other long term (current) drug therapy: Secondary | ICD-10-CM | POA: Diagnosis not present

## 2019-08-22 DIAGNOSIS — E7800 Pure hypercholesterolemia, unspecified: Secondary | ICD-10-CM | POA: Diagnosis not present

## 2019-08-22 DIAGNOSIS — E039 Hypothyroidism, unspecified: Secondary | ICD-10-CM | POA: Diagnosis not present

## 2019-08-22 DIAGNOSIS — K591 Functional diarrhea: Secondary | ICD-10-CM | POA: Diagnosis not present

## 2019-08-22 DIAGNOSIS — K219 Gastro-esophageal reflux disease without esophagitis: Secondary | ICD-10-CM | POA: Diagnosis not present

## 2019-08-22 DIAGNOSIS — L293 Anogenital pruritus, unspecified: Secondary | ICD-10-CM | POA: Diagnosis not present

## 2019-09-26 ENCOUNTER — Other Ambulatory Visit: Payer: Self-pay

## 2019-10-25 ENCOUNTER — Other Ambulatory Visit: Payer: Self-pay | Admitting: *Deleted

## 2019-10-25 MED ORDER — APIXABAN 5 MG PO TABS: 5.0000 mg | ORAL_TABLET | Freq: Two times a day (BID) | ORAL | 1 refills | Status: DC

## 2019-10-25 NOTE — Telephone Encounter (Signed)
Eliquis 5mg  refill request received. Patient is 74 years old, weight-92.5kg, Crea-0.87 on 02/21/2019 via [facility] at [place], [place], and last seen by Dr. Acie Fredrickson on 06/23/2019. Dose is appropriate based on dosing criteria. Will send in refill to requested pharmacy.

## 2019-10-26 ENCOUNTER — Telehealth: Payer: Self-pay | Admitting: Cardiovascular Disease

## 2019-10-26 NOTE — Telephone Encounter (Signed)
Patient calling the office for samples of medication:   1.  What medication and dosage are you requesting samples for? apixaban (ELIQUIS) 5 MG TABS tablet  2.  Are you currently out of this medication? No  Patient says she has enough medication to last her until the end of October but then she will be in the donut hole until January 1. She would like to know if the office has enough samples to last her until January

## 2019-10-26 NOTE — Telephone Encounter (Signed)
Hey Lynn, LPN, can you please advise on this matter? Thanks  ?

## 2019-10-27 ENCOUNTER — Other Ambulatory Visit: Payer: Self-pay | Admitting: *Deleted

## 2019-10-27 MED ORDER — APIXABAN 5 MG PO TABS: 5.0000 mg | ORAL_TABLET | Freq: Two times a day (BID) | ORAL | 1 refills | Status: DC

## 2019-10-27 MED ORDER — APIXABAN 5 MG PO TABS: 5.0000 mg | ORAL_TABLET | Freq: Two times a day (BID) | ORAL | 0 refills | Status: DC

## 2019-10-27 NOTE — Telephone Encounter (Signed)
Pt would like medication to be sent to [facility] in [place].   Prescription refill request for Eliquis received.  Last office visit: Nahser, 06/23/2019 Scr: 0.87, 02/21/2019 Age: 74 Weight: 92.5 kg  Prescription refill sent.

## 2019-10-27 NOTE — Telephone Encounter (Addendum)
**Note De-Identified Khira Cudmore Obfuscation** I encouraged the pt to re-apply for pt asst through [employer] for her Eliquis as she was approved for it last year but she states she only needs enough to last her until 2022 when her ins. starts paying again.  I explained that our samples are meant for new starts and that since she has enough to last her until the 3rd week in October we have plenty of time for her to apply and that I will help her in any way I can.  She is requesting that I e-scribe her Eliquis to [facility] for a 90 day supply as she wants to see how much it will cost her with GoodRx which I have done.  She states that if the cost is sill too expensive using Good[employer] she will call [employer] to request that they mail her a Pt Asst application.  She is aware to complete her part of the application, obtain required documents per [employer] and to bring all to the office to drop off and that we will take care of Dr Julious Payer part of the application and will fax all to [place].

## 2019-11-18 IMAGING — DX DG CHEST 2V
2 series · 2 of 2 positions shown · non-contrast
Comparison: Chest x-ray of March 13, 2014

CLINICAL DATA: One week of productive cough. History of atrial
fibrillation, hypertension, nonsmoker.

EXAM:
CHEST  2 VIEW

[dg chest 2 view (1 of 2)]
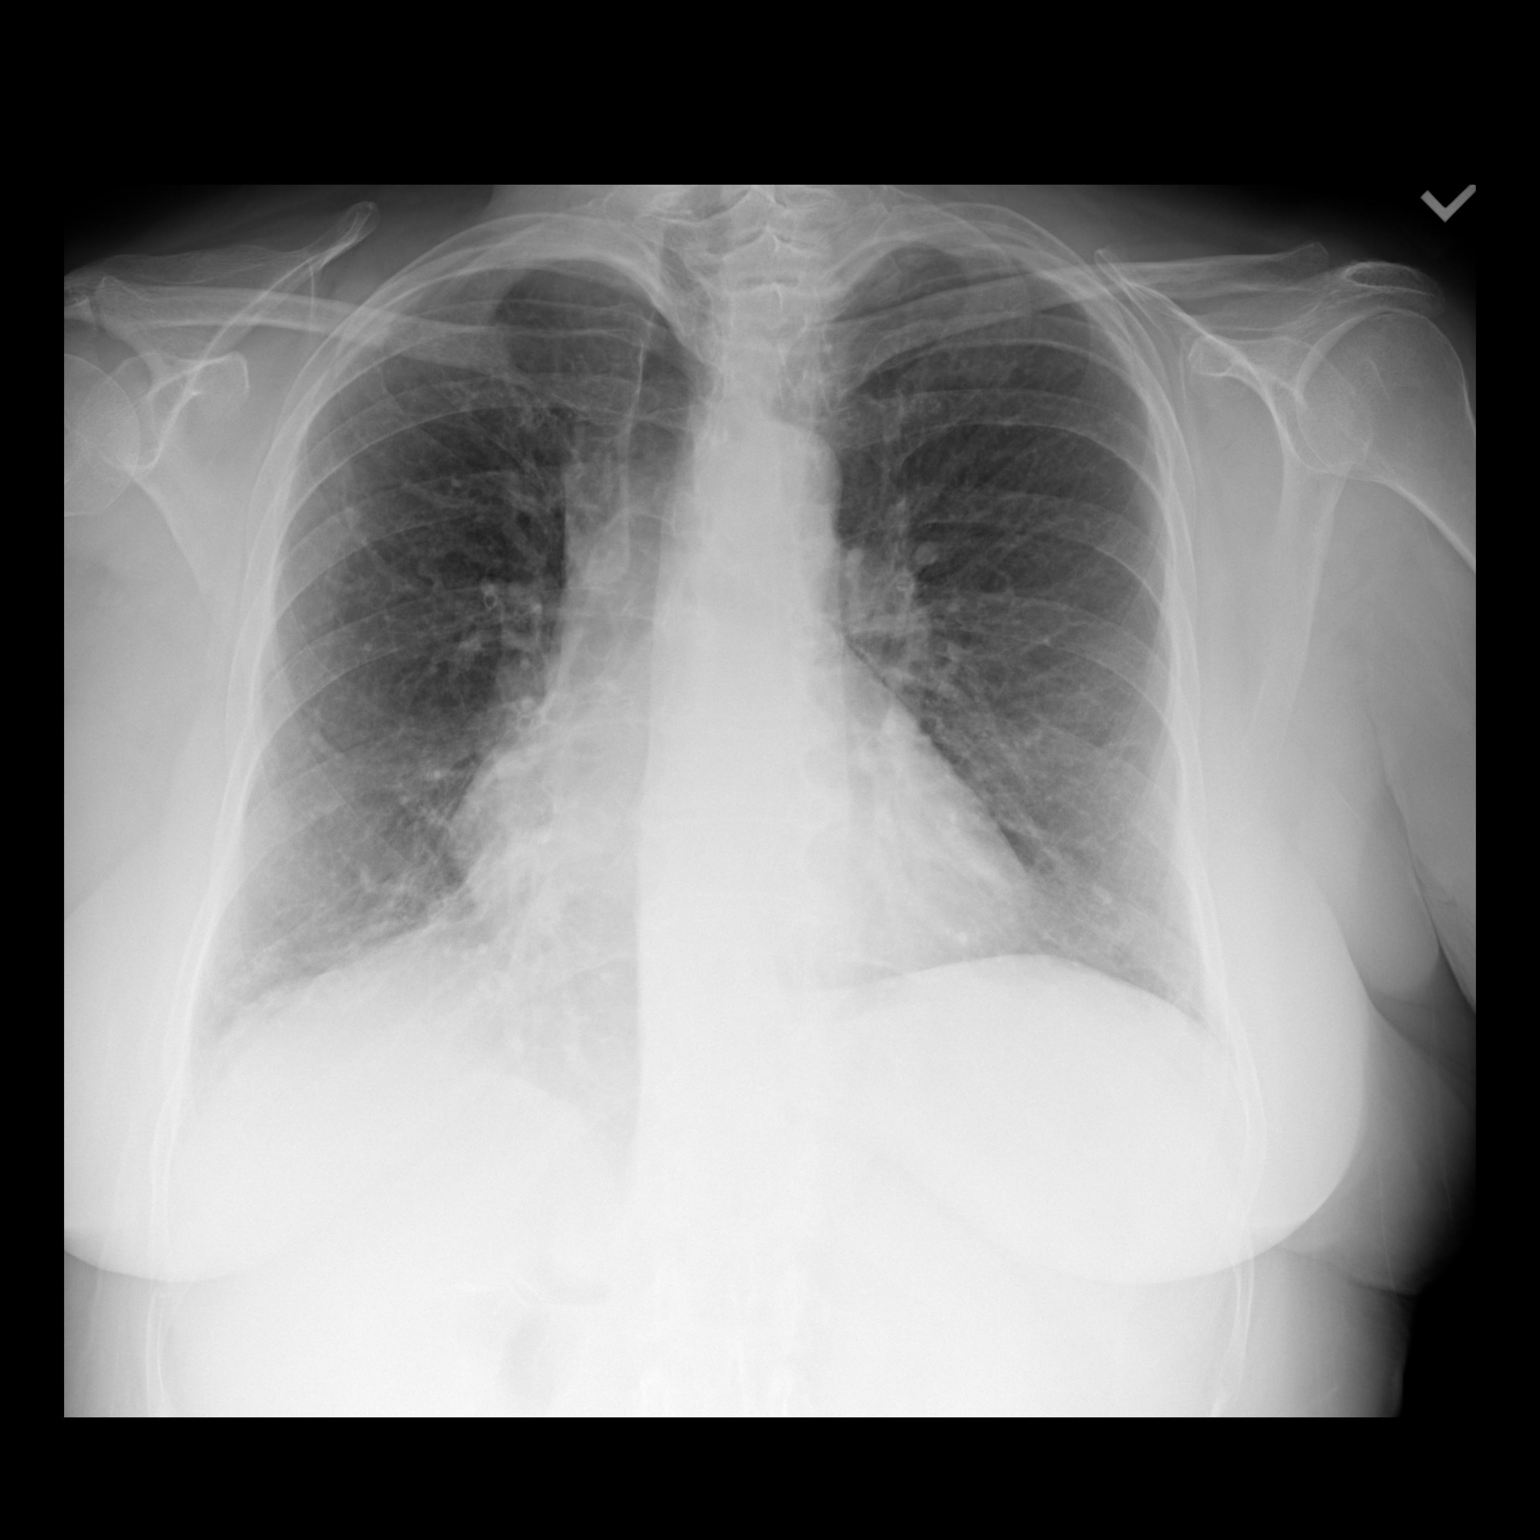

[dg chest 2 view (2 of 2)]
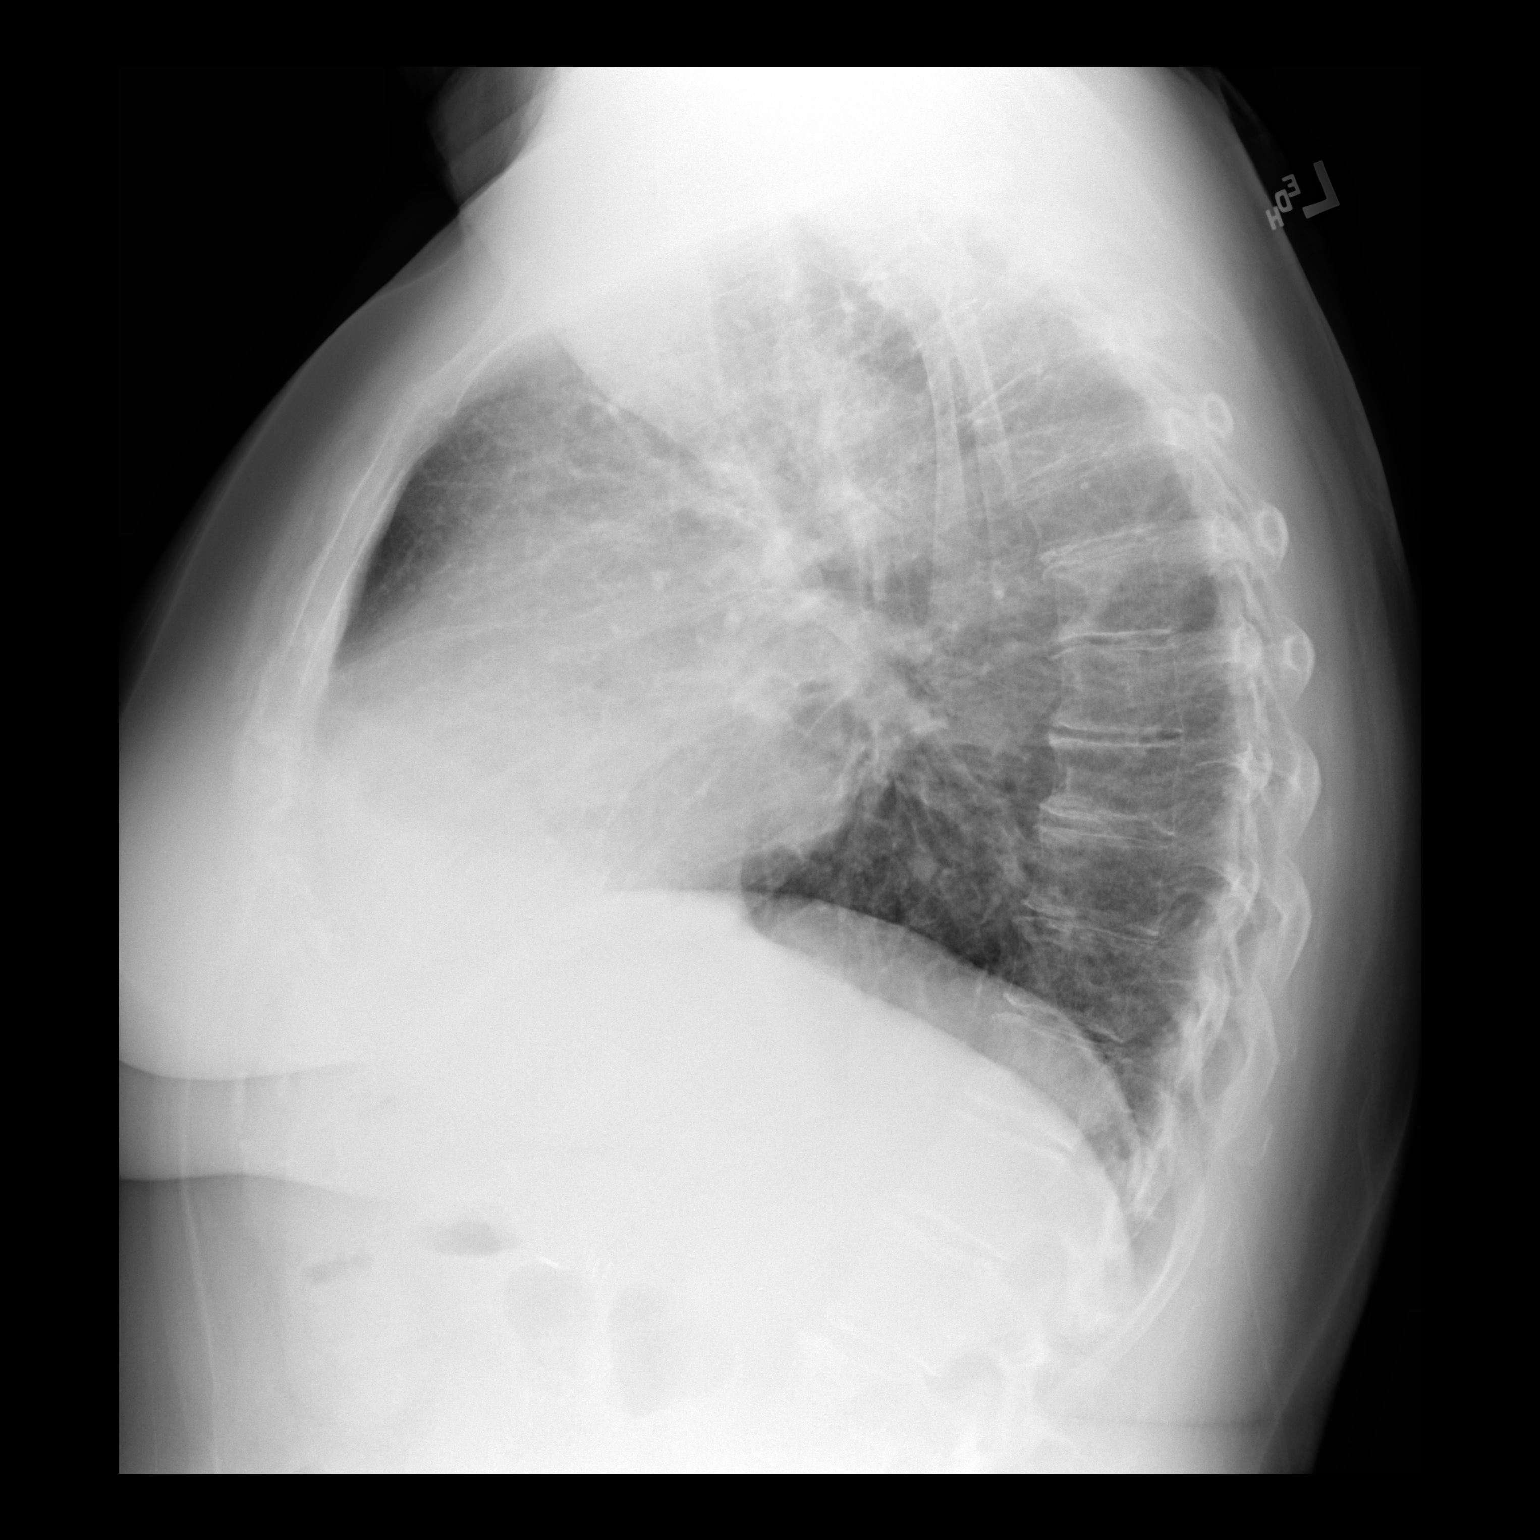

[2 of 2 positions shown; findings below may reference images not displayed]

FINDINGS: The lungs are adequately inflated. The lung markings are coarse in
the right infrahilar region. There is no pleural effusion. The left
lung is clear. The heart is top-normal in size. The pulmonary
vascularity is normal. The trachea is positioned slightly to the
right of midline but this is chronic.
IMPRESSION: No alveolar pneumonia nor pulmonary edema. Chronic bronchitic
changes.

Multilevel degenerative disc disease of the thoracic spine.

## 2019-11-25 IMAGING — DX DG SACRUM/COCCYX 2+V
3 series · 3 of 3 positions shown · non-contrast
Comparison: None.

CLINICAL DATA: Acute midline low back pain without sciatica

EXAM:
SACRUM AND COCCYX - 2+ VIEW

[dg sacrum/coccyx (1 of 3)]
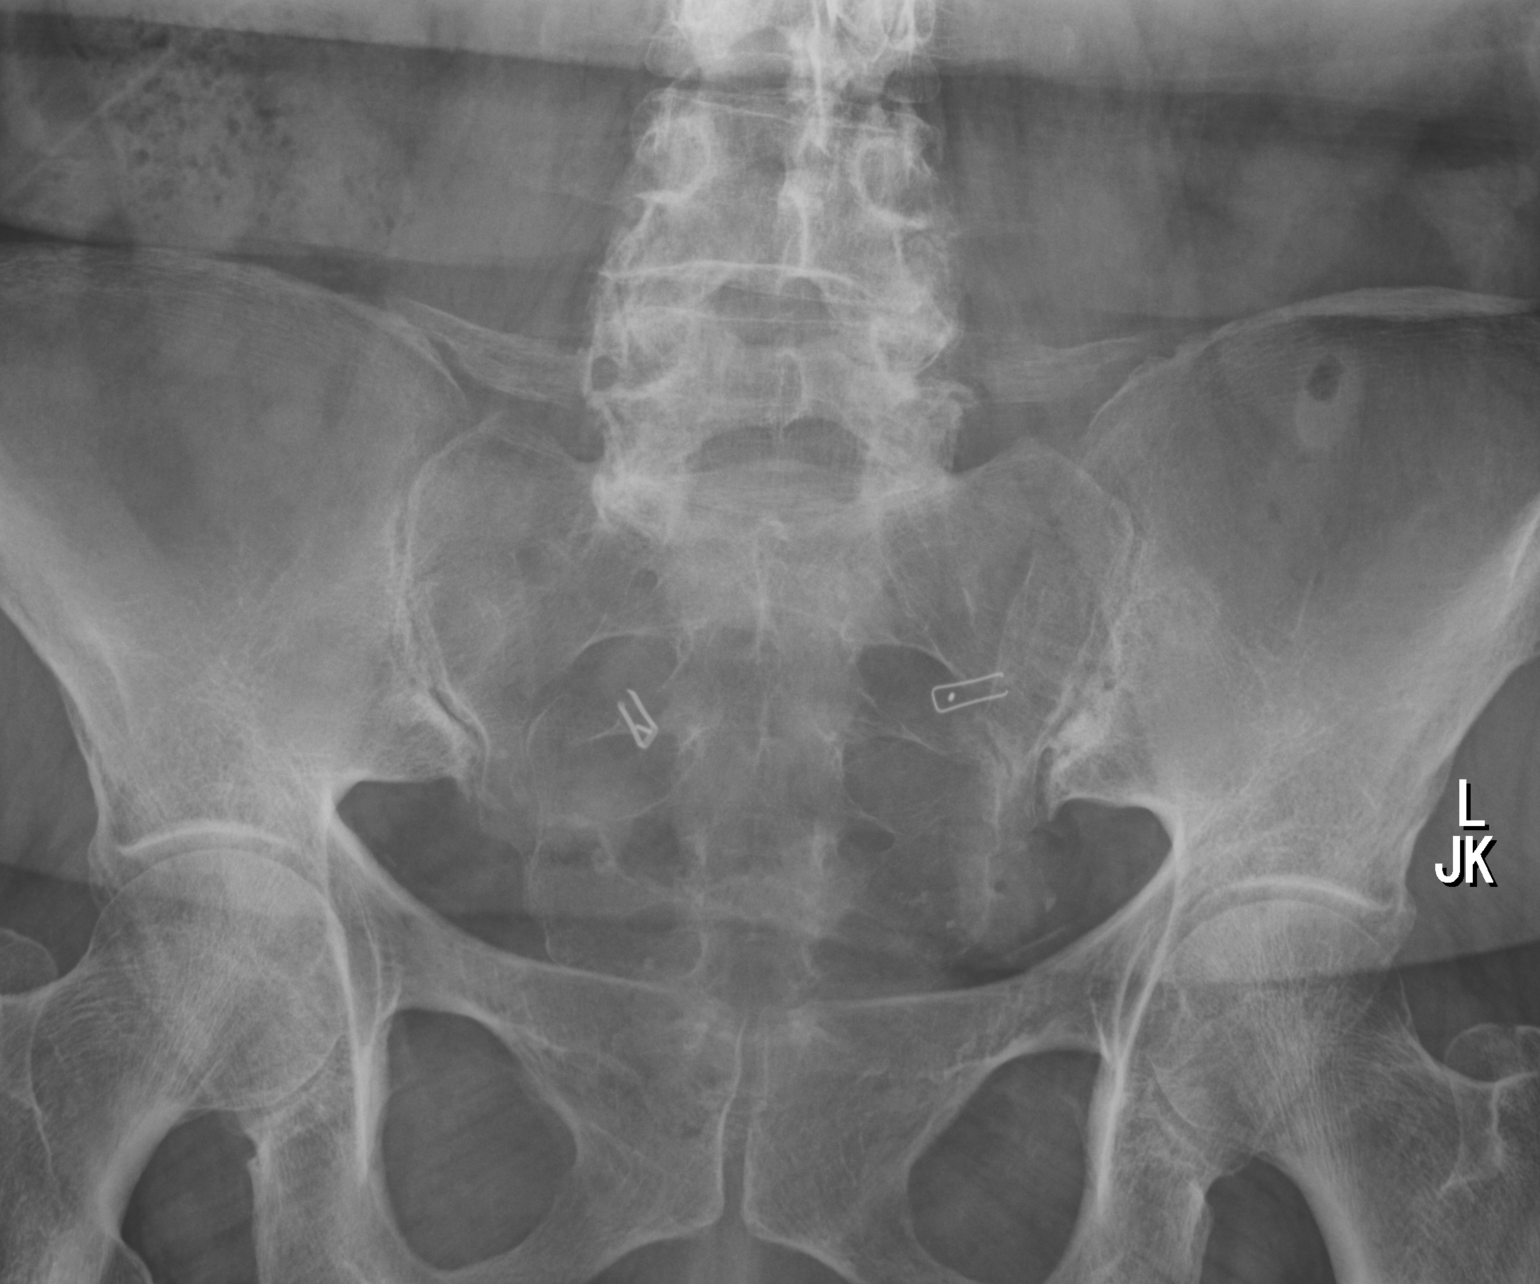

[dg sacrum/coccyx (2 of 3)]
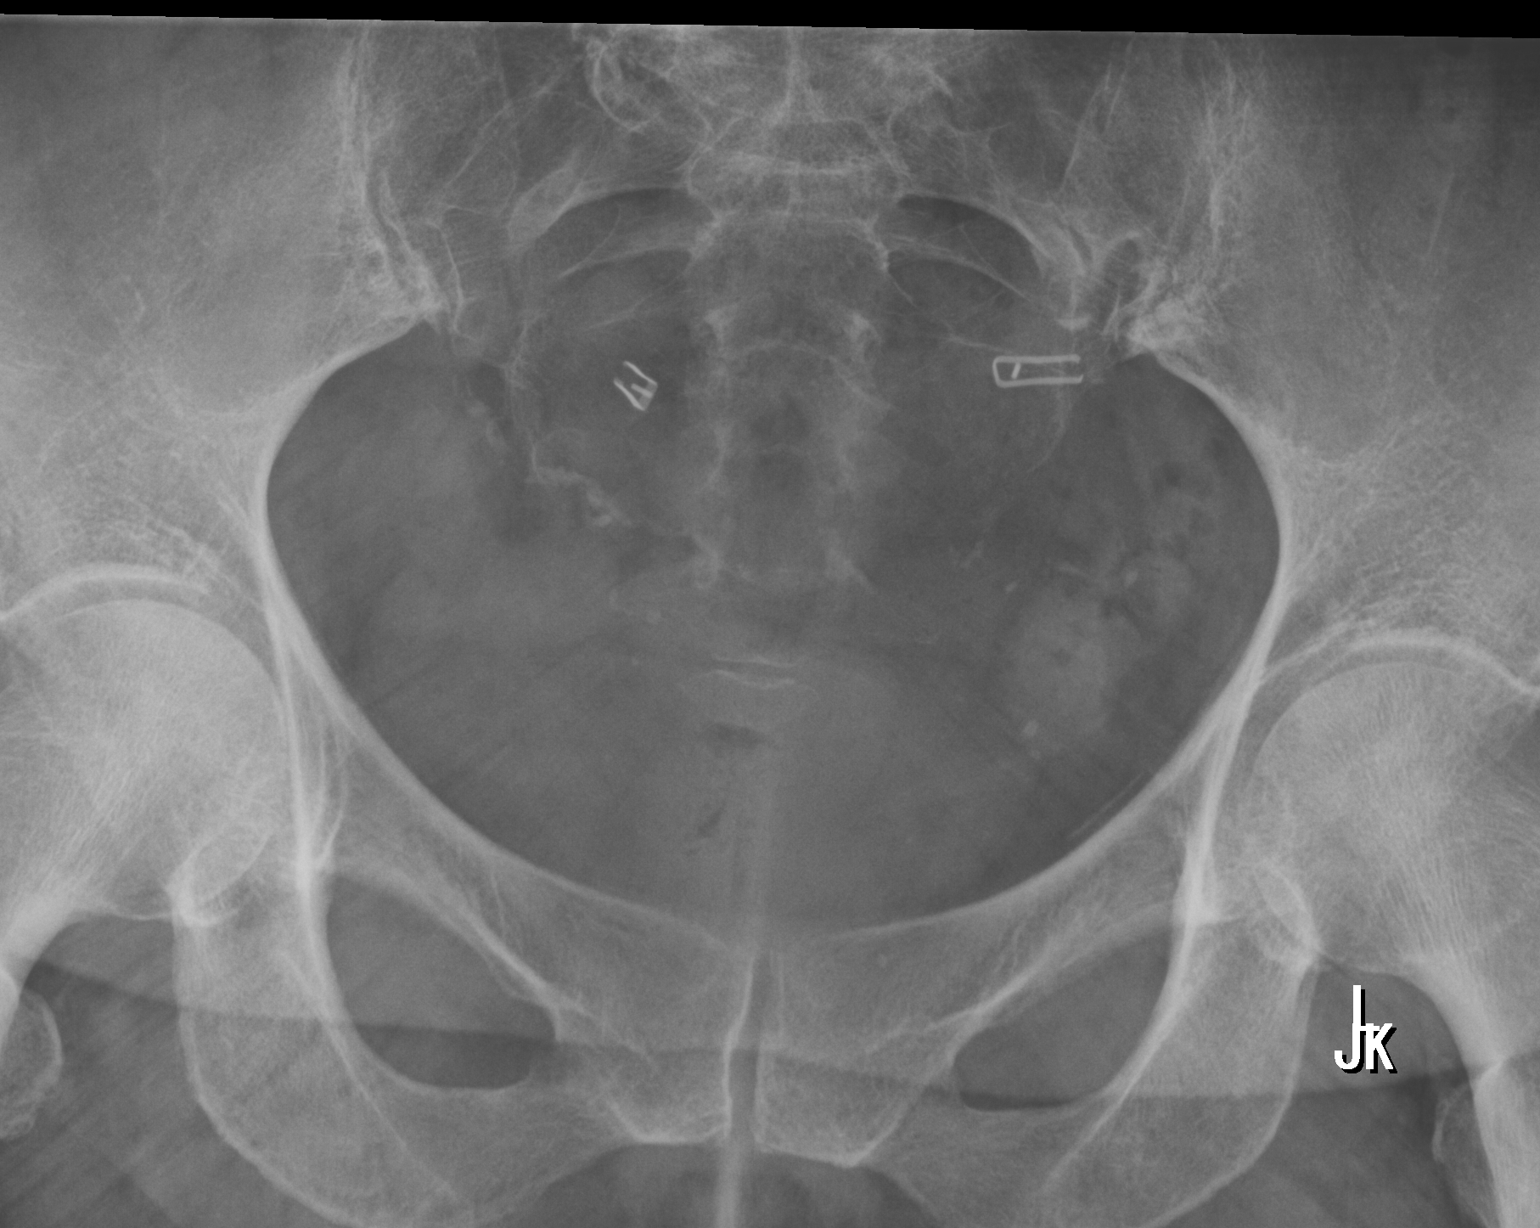

[dg sacrum/coccyx (3 of 3)]
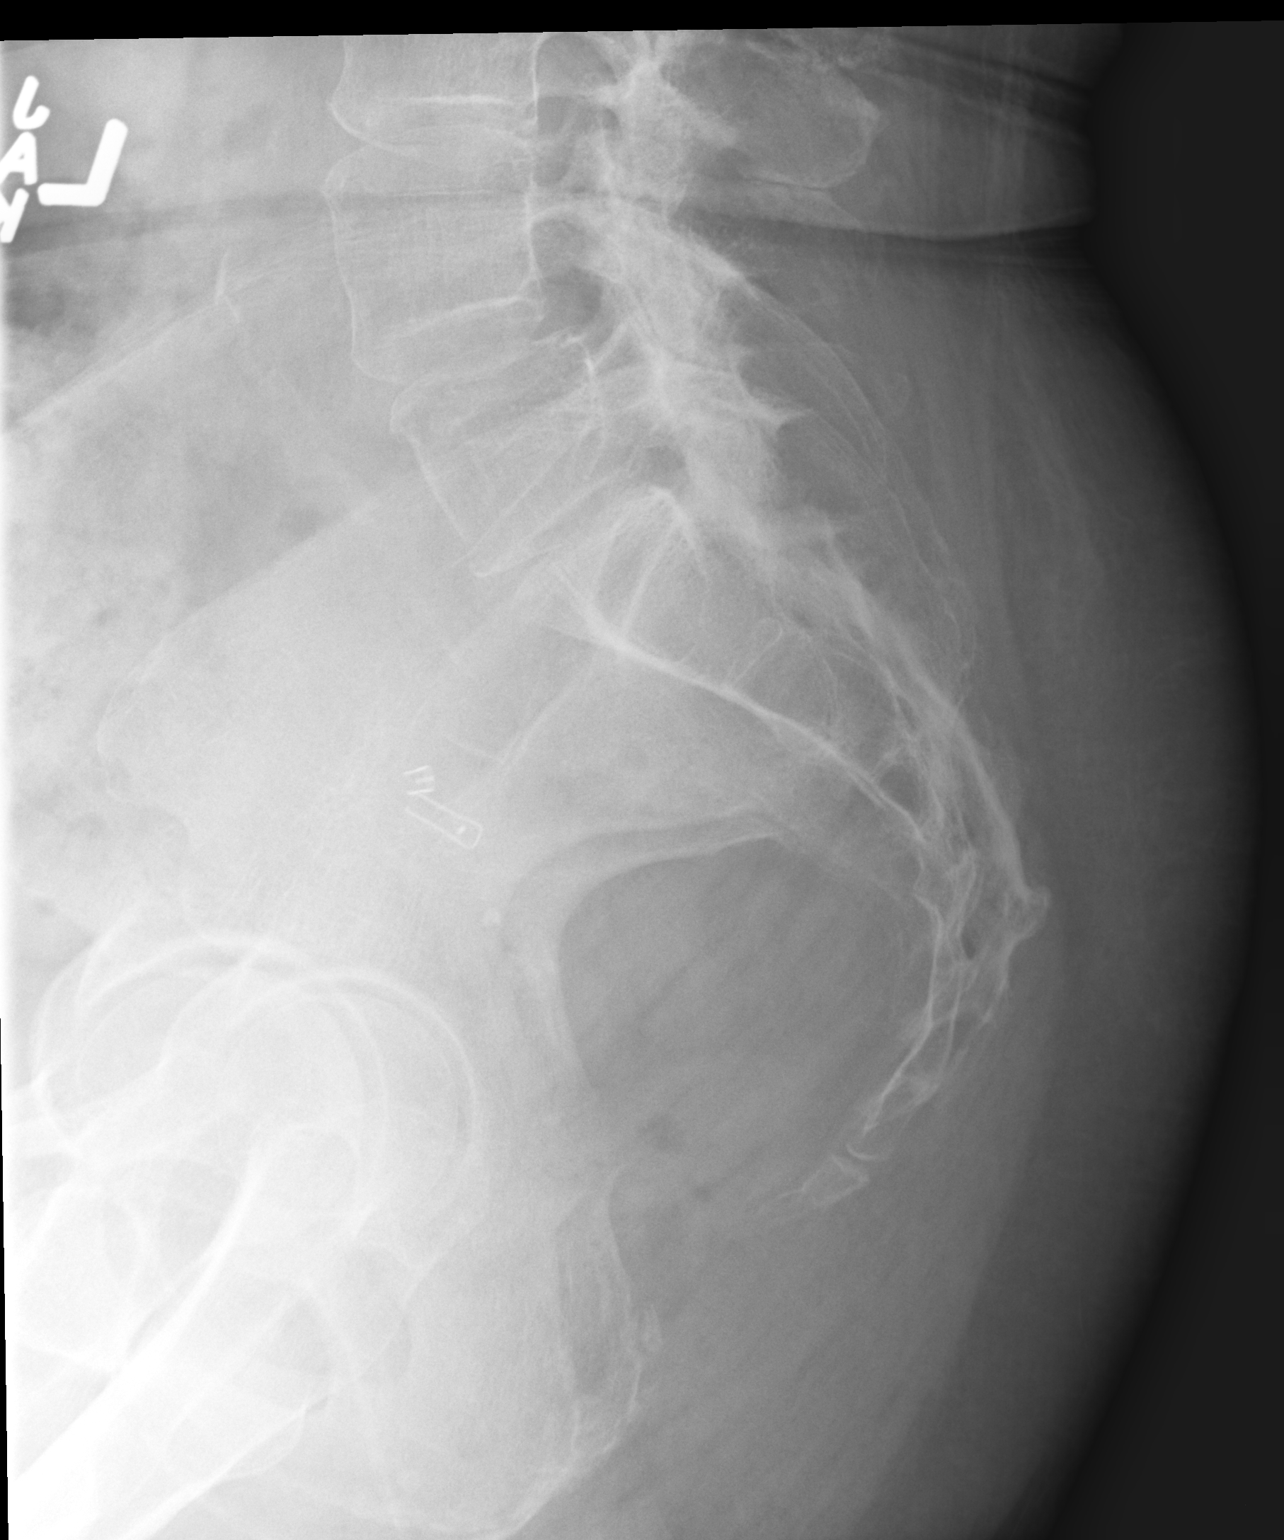

[3 of 3 positions shown; findings below may reference images not displayed]

FINDINGS: Negative for sacral fracture or mass. Mild degenerative change in
the lower SI joint bilaterally. No erosion or ankylosis.

Grade 1 anterolisthesis L4-5 with disc and facet degeneration.
Bilateral fallopian tube clips..
IMPRESSION: Mild degenerative change in the SI joints bilaterally.

## 2019-12-19 NOTE — Progress Notes (Signed)
Cardiology Office Note:    Date:  12/20/2019   ID:  Kayla Hale [id number], MRN [id number]  PCP:  Kayla Guest, NP  [facility] Cardiologist:  Mertie Moores, MD  [facility] Electrophysiologist:  None   Referring MD: Kayla Guest, NP   Chief Complaint:  Follow-up (Atrial fibrillation) and Shortness of Breath    Patient Profile:    Kayla Hale is a 74 y.o. female with:   Permanent atrial fibrillation   Hypertension   Fhx of CAD   Hyperlipidemia   GERD   Hypothyroidism   Prior CV studies: Echocardiogram 12/06/13 Mild LVH, EF 55-60, no RWMA, mild AI, mild MR, severe LAE, mild RAE, mild TR, PASP 20   History of Present Illness:    Ms. Kayla Hale was last seen by Dr. Acie Hale in 4/21.  She returns for follow up.  She is here alone.  Over the past few months, she has noticed dyspnea with exertion.  She walks on a treadmill several times a week.  She has had to stop exercise early due to this.  She feels that her shortness of breath has gotten worse.  She has not had orthopnea, paroxysmal nocturnal dyspnea, chest discomfort, syncope.  She does have mild ankle edema that resolves with elevation.      Past Medical History:  Diagnosis Date  . Atrial fibrillation ([place])   . Chronic kidney disease    STAGE 2  . Diverticulosis 2004  . Dyslipidemia   . Gallstones   . GERD (gastroesophageal reflux disease)   . Hyperlipidemia   . Hypertension   . Hypothyroidism   . Mild anemia   . Mild obesity   . Seasonal allergies   . Shingles 2005   LOWER BACK  . Thyroid disease   . Urinary incontinence     Current Medications: Current Meds  Medication Sig  . ALPRAZolam (XANAX) 0.5 MG tablet Take 0.5 mg by mouth daily.   Marland Kitchen apixaban (ELIQUIS) 5 MG TABS tablet Take 1 tablet (5 mg total) by mouth 2 (two) times daily.  . Ascorbic Acid (VITAMIN C) 1000 MG tablet Take 1,000 mg by mouth daily.  . Calcium Carbonate-Vitamin D (CALCIUM-VITAMIN D) 500-200 MG-UNIT per  tablet Take 1 tablet by mouth daily.  . cholecalciferol (VITAMIN D) 1000 UNITS tablet Take 1,000 Units by mouth daily.  . fluticasone (FLONASE) 50 MCG/ACT nasal spray Place 1 spray into both nostrils daily as needed.  Marland Kitchen levothyroxine (SYNTHROID, LEVOTHROID) 75 MCG tablet Take 75 mcg by mouth daily before breakfast.  . MAGNESIUM-ZINC PO Take 1 tablet by mouth daily.  . metoprolol tartrate (LOPRESSOR) 50 MG tablet Take 1 tablet (50 mg total) by mouth 2 (two) times daily.  . Misc Natural Products (AIRBORNE ELDERBERRY PO) Take 1 tablet by mouth daily.  . Multiple Vitamin (MULTIVITAMIN) tablet Take 1 tablet by mouth daily.  . Omega-3 Fatty Acids (FISH OIL) 1000 MG CAPS Take 1 capsule by mouth daily.  Marland Kitchen omeprazole (PRILOSEC) 20 MG capsule Take 20 mg by mouth daily.  Marland Kitchen oxybutynin (DITROPAN) 5 MG tablet Take 5 mg by mouth 3 (three) times daily.  . pravastatin (PRAVACHOL) 20 MG tablet Take 20 mg by mouth daily.  Marland Kitchen UNABLE TO FIND cbd oil qhs  . zinc gluconate 50 MG tablet Take 50 mg by mouth daily.     Allergies:   Lisinopril and Norvasc [amlodipine besylate]   Social History   Tobacco Use  . Smoking status: Never Smoker  .  Smokeless tobacco: Never Used  Vaping Use  . Vaping Use: Never used  Substance Use Topics  . Alcohol use: No  . Drug use: No     Family Hx: The patient's family history includes CVA in her brother; Diabetes in her brother and sister; Heart attack in her father and sister; Hypertension in her father.  Review of Systems  Respiratory: Negative for hemoptysis.   Gastrointestinal: Negative for hematochezia and melena.  Genitourinary: Negative for hematuria.     EKGs/Labs/Other Test Reviewed:    EKG:  EKG is   ordered today.  The ekg ordered today demonstrates atrial fibrillation, HR 86, nonspecific ST-T wave changes, QTC 418, no change from prior tracing  Recent Labs: No results found for requested labs within last 8760 hours.   Recent Lipid Panel No results found  for: CHOL, TRIG, HDL, CHOLHDL, LDLCALC, LDLDIRECT  Labs from PCP personally reviewed and interpreted 07/2000/21: Total cholesterol 162, HDL 49, triglycerides 162, hemoglobin 12.7, creatinine 0.92, K+ 4.1, ALT 19, TSH 1.15  Risk Assessment/Calculations:     CHA2DS2-VASc Score = 3  This indicates a 3.2% annual risk of stroke. The patient's score is based upon: CHF History: 0 HTN History: 1 Diabetes History: 0 Stroke History: 0 Vascular Disease History: 0 Age Score: 1 Gender Score: 1      10-year ASCVD risk: 14.4%   Physical Exam:    VS:  BP 118/72   Pulse 86   Ht 5\' 6"  (1.676 m)   Wt 205 lb 6.4 oz (93.2 kg)   SpO2 96%   BMI 33.15 kg/m     Wt Readings from Last 3 Encounters:  12/20/19 205 lb 6.4 oz (93.2 kg)  06/23/19 204 lb (92.5 kg)  03/14/19 199 lb 12.8 oz (90.6 kg)     Constitutional:      Appearance: Healthy appearance. Not in distress.  Neck:     Vascular: JVD normal.  Pulmonary:     Effort: Pulmonary effort is normal.     Breath sounds: No wheezing. No rales.  Cardiovascular:     Normal rate. Irregularly irregular rhythm. Normal S1. Normal S2.     Murmurs: There is no murmur.  Edema:    Peripheral edema absent.  Abdominal:     General: There is no distension.     Palpations: Abdomen is soft.  Skin:    General: Skin is warm and dry.  Neurological:     Mental Status: Alert and oriented to person, place and time.     Cranial Nerves: Cranial nerves are intact.      ASSESSMENT & PLAN:    1. Shortness of breath She has noted progressively worsening dyspnea with exertion over the past 2 months.  She does not display any signs or symptoms of congestive heart failure and previous EF was.  She does have a strong family history of coronary artery disease.  She does not have exertional chest discomfort but question if this could be an anginal equivalent.  She had mild valvular disease by echocardiogram in 2015 (mild aortic insufficiency, mitral regurgitation  and tricuspid regurgitation).  She does not have any findings on exam that would suggest worsening valve disease.  However, it has been 6 years since her last study.  She is on chronic anticoagulation with Apixaban.  She has not noticed any signs or symptoms of bleeding.  However, I feel we should rule out occult bleeding.  Of note, her hemoglobin was normal in June.  BMI is 33.  Her symptoms could all be related to obesity and deconditioning.  -Obtain BMET, BNP, CBC  -Obtain echocardiogram  -Obtain Lexiscan Myoview  -Follow-up virtual visit in 6 weeks to review test results  2. Permanent atrial fibrillation (HCC) CHA2DS2-VASc Score = 3 [CHF History: 0, HTN History: 1, Diabetes History: 0, Stroke History: 0, Vascular Disease History: 0, Age Score: 1, Gender Score: 1].  Therefore, the patient's annual risk of stroke is 3.2 %.   Continue current dose of Apixaban.  Rate is controlled.  Continue current dose of metoprolol.  3. Essential hypertension The patient's blood pressure is controlled on her current regimen.  Continue current therapy.   4. Mixed hyperlipidemia Managed by primary care.     Dispo:  Return in about 6 weeks (around 01/31/2020) for Follow up after testing, w/ Dr. Acie Hale, or Richardson Dopp, PA-C, via Telemedicine.   Medication Adjustments/Labs and Tests Ordered: Current medicines are reviewed at length with the patient today.  Concerns regarding medicines are outlined above.  Tests Ordered: Orders Placed This Encounter  Procedures  . CBC  . Basic metabolic panel  . Pro b natriuretic peptide (BNP)  . MYOCARDIAL PERFUSION IMAGING  . ECHOCARDIOGRAM COMPLETE   Medication Changes: No orders of the defined types were placed in this encounter.   Signed, Richardson Dopp, PA-C  12/20/2019 12:43 PM    [facility] [place], [place], [place]  [address] Phone: [phone]; Fax: [phone]

## 2019-12-20 ENCOUNTER — Other Ambulatory Visit: Payer: Self-pay

## 2019-12-20 ENCOUNTER — Encounter: Payer: Self-pay | Admitting: Physician Assistant

## 2019-12-20 ENCOUNTER — Ambulatory Visit: Payer: PPO | Admitting: Physician Assistant

## 2019-12-20 VITALS — BP 118/72 | HR 86 | Ht 66.0 in | Wt 205.4 lb

## 2019-12-20 DIAGNOSIS — I4821 Permanent atrial fibrillation: Secondary | ICD-10-CM

## 2019-12-20 DIAGNOSIS — R0602 Shortness of breath: Secondary | ICD-10-CM

## 2019-12-20 DIAGNOSIS — I10 Essential (primary) hypertension: Secondary | ICD-10-CM | POA: Diagnosis not present

## 2019-12-20 DIAGNOSIS — E782 Mixed hyperlipidemia: Secondary | ICD-10-CM | POA: Diagnosis not present

## 2019-12-20 NOTE — Patient Instructions (Signed)
Medication Instructions:  Your physician recommends that you continue on your current medications as directed. Please refer to the Current Medication list given to you today.  *If you need a refill on your cardiac medications before your next appointment, please call your pharmacy*  Lab Work: You will have labs drawn today: BMET/CBC/BNP  Testing/Procedures: Your physician has requested that you have an echocardiogram. Echocardiography is a painless test that uses sound waves to create images of your heart. It provides your doctor with information about the size and shape of your heart and how well your heart's chambers and valves are working. This procedure takes approximately one hour. There are no restrictions for this procedure.  Your physician has requested that you have a lexiscan myoview. For further information please visit [email or web address]. Please follow instruction sheet, as given.  Follow-Up: At [facility], you and your health needs are our priority.  As part of our continuing mission to provide you with exceptional heart care, we have created designated Provider Care Teams.  These Care Teams include your primary Cardiologist (physician) and Advanced Practice Providers (APPs -  Physician Assistants and Nurse Practitioners) who all work together to provide you with the care you need, when you need it.  Your next appointment:   6 week(s)  The format for your next appointment:   In Person  Provider:   You may see Mertie Moores, MD or Richardson Dopp, PA-C

## 2019-12-21 ENCOUNTER — Telehealth: Payer: Self-pay

## 2019-12-21 DIAGNOSIS — I10 Essential (primary) hypertension: Secondary | ICD-10-CM

## 2019-12-21 DIAGNOSIS — R0602 Shortness of breath: Secondary | ICD-10-CM

## 2019-12-21 LAB — BASIC METABOLIC PANEL
BUN/Creatinine Ratio: 17 (ref 12–28)
BUN: 13 mg/dL (ref 8–27)
CO2: 23 mmol/L (ref 20–29)
Calcium: 9.1 mg/dL (ref 8.7–10.3)
Chloride: 103 mmol/L (ref 96–106)
Creatinine, Ser: 0.78 mg/dL (ref 0.57–1.00)
GFR calc Af Amer: 87 mL/min/{1.73_m2} (ref >59)
GFR calc non Af Amer: 76 mL/min/{1.73_m2} (ref >59)
Glucose: 98 mg/dL (ref 65–99)
Potassium: 4.4 mmol/L (ref 3.5–5.2)
Sodium: 140 mmol/L (ref 134–144)

## 2019-12-21 LAB — CBC
Hematocrit: 35 % (ref 34.0–46.6)
Hemoglobin: 12.1 g/dL (ref 11.1–15.9)
MCH: 32.1 pg (ref 26.6–33.0)
MCHC: 34.6 g/dL (ref 31.5–35.7)
MCV: 93 fL (ref 79–97)
Platelets: 233 10*3/uL (ref 150–450)
RBC: 3.77 x10E6/uL (ref 3.77–5.28)
RDW: 12.8 % (ref 11.7–15.4)
WBC: 6.6 10*3/uL (ref 3.4–10.8)

## 2019-12-21 LAB — PRO B NATRIURETIC PEPTIDE: NT-Pro BNP: 1845 pg/mL — ABNORMAL HIGH (ref 0–301)

## 2019-12-21 MED ORDER — FUROSEMIDE 20 MG PO TABS: 20.0000 mg | ORAL_TABLET | Freq: Every day | ORAL | 3 refills | Status: DC

## 2019-12-21 MED ORDER — POTASSIUM CHLORIDE ER 10 MEQ PO TBCR: 10.0000 meq | EXTENDED_RELEASE_TABLET | Freq: Every day | ORAL | 3 refills | Status: DC

## 2019-12-21 NOTE — Telephone Encounter (Signed)
-----   Message from Liliane Shi, [place] sent at 12/21/2019  7:54 AM EDT ----- Hgb, Creatinine, K+ normal.  BNP is elevated.  PLAN:  - Start Furosemide 20 mg once daily  - Start K+ 10 mEq once daily  - BMET, BNP 7-10 days Richardson Dopp, PA-C    12/21/2019 7:48 AM

## 2019-12-21 NOTE — Telephone Encounter (Signed)
Call placed to pt.  Advised of results.  Sent new prescriptions to mail order pharmacy as requested.  She will get repeat labs on 11/8 when she is here for her ECHO/stress test.  Advised to hold metoprolol on morning of tests.

## 2019-12-22 ENCOUNTER — Other Ambulatory Visit: Payer: Self-pay

## 2019-12-22 NOTE — Addendum Note (Signed)
Addended by: Gwyndolyn Saxon R on: 12/22/2019 08:00 AM   Modules accepted: Orders

## 2020-01-03 ENCOUNTER — Telehealth (HOSPITAL_COMMUNITY): Payer: Self-pay | Admitting: *Deleted

## 2020-01-03 ENCOUNTER — Encounter (HOSPITAL_COMMUNITY): Payer: Self-pay | Admitting: *Deleted

## 2020-01-03 NOTE — Telephone Encounter (Signed)
Left message on voicemail per DPR in reference to upcoming appointment scheduled on 01/08/2020 at 1045 with detailed instructions given per Myocardial Perfusion Study Information Sheet for the test. LM to arrive 15 minutes early, and that it is imperative to arrive on time for appointment to keep from having the test rescheduled. If you need to cancel or reschedule your appointment, please call the office within 24 hours of your appointment. Failure to do so may result in a cancellation of your appointment, and a $50 no show fee. Phone number given for call back for any questions.  Mychart letter sent with instructions.Tomeko Scoville, Ranae [place]

## 2020-01-08 ENCOUNTER — Other Ambulatory Visit: Payer: PPO | Admitting: *Deleted

## 2020-01-08 ENCOUNTER — Other Ambulatory Visit: Payer: Self-pay

## 2020-01-08 ENCOUNTER — Ambulatory Visit (HOSPITAL_BASED_OUTPATIENT_CLINIC_OR_DEPARTMENT_OTHER): Payer: PPO

## 2020-01-08 ENCOUNTER — Ambulatory Visit (HOSPITAL_COMMUNITY): Payer: PPO | Attending: Cardiology

## 2020-01-08 DIAGNOSIS — R0602 Shortness of breath: Secondary | ICD-10-CM | POA: Diagnosis not present

## 2020-01-08 DIAGNOSIS — I4821 Permanent atrial fibrillation: Secondary | ICD-10-CM | POA: Diagnosis not present

## 2020-01-08 DIAGNOSIS — I10 Essential (primary) hypertension: Secondary | ICD-10-CM

## 2020-01-08 LAB — BASIC METABOLIC PANEL
BUN/Creatinine Ratio: 22 (ref 12–28)
BUN: 19 mg/dL (ref 8–27)
CO2: 24 mmol/L (ref 20–29)
Calcium: 10 mg/dL (ref 8.7–10.3)
Chloride: 102 mmol/L (ref 96–106)
Creatinine, Ser: 0.85 mg/dL (ref 0.57–1.00)
GFR calc Af Amer: 79 mL/min/{1.73_m2} (ref >59)
GFR calc non Af Amer: 68 mL/min/{1.73_m2} (ref >59)
Glucose: 97 mg/dL (ref 65–99)
Potassium: 4.7 mmol/L (ref 3.5–5.2)
Sodium: 141 mmol/L (ref 134–144)

## 2020-01-08 LAB — MYOCARDIAL PERFUSION IMAGING
LV dias vol: 53 mL (ref 46–106)
LV sys vol: 31 mL
Peak HR: 139 {beats}/min
Rest HR: 96 {beats}/min
SDS: 3
SRS: 2
SSS: 5
TID: 1.05

## 2020-01-08 LAB — ECHOCARDIOGRAM COMPLETE
Height: 66 in
P 1/2 time: 356 msec
S' Lateral: 3.2 cm
Weight: 3280 oz

## 2020-01-08 LAB — PRO B NATRIURETIC PEPTIDE: NT-Pro BNP: 1134 pg/mL — ABNORMAL HIGH (ref 0–301)

## 2020-01-08 MED ORDER — TECHNETIUM TC 99M TETROFOSMIN IV KIT
10.7000 | PACK | Freq: Once | INTRAVENOUS | Status: AC | PRN
  Administered 2020-01-08: 10.7 via INTRAVENOUS
  Filled 2020-01-08: qty 11

## 2020-01-08 MED ORDER — PERFLUTREN LIPID MICROSPHERE
1.0000 mL | INTRAVENOUS | Status: AC | PRN
  Administered 2020-01-08: 2 mL via INTRAVENOUS

## 2020-01-08 MED ORDER — REGADENOSON 0.4 MG/5ML IV SOLN
0.4000 mg | Freq: Once | INTRAVENOUS | Status: AC
  Administered 2020-01-08: 0.4 mg via INTRAVENOUS

## 2020-01-08 MED ORDER — TECHNETIUM TC 99M TETROFOSMIN IV KIT
32.1000 | PACK | Freq: Once | INTRAVENOUS | Status: AC | PRN
  Administered 2020-01-08: 32.1 via INTRAVENOUS
  Filled 2020-01-08: qty 33

## 2020-01-09 ENCOUNTER — Telehealth: Payer: Self-pay

## 2020-01-09 ENCOUNTER — Encounter: Payer: Self-pay | Admitting: Physician Assistant

## 2020-01-09 DIAGNOSIS — I10 Essential (primary) hypertension: Secondary | ICD-10-CM

## 2020-01-09 DIAGNOSIS — I5030 Unspecified diastolic (congestive) heart failure: Secondary | ICD-10-CM

## 2020-01-09 DIAGNOSIS — R0602 Shortness of breath: Secondary | ICD-10-CM

## 2020-01-09 HISTORY — DX: Unspecified diastolic (congestive) heart failure: I50.30

## 2020-01-09 MED ORDER — FUROSEMIDE 40 MG PO TABS: 40.0000 mg | ORAL_TABLET | Freq: Every day | ORAL | 3 refills | Status: DC

## 2020-01-09 MED ORDER — POTASSIUM CHLORIDE CRYS ER 20 MEQ PO TBCR: 20.0000 meq | EXTENDED_RELEASE_TABLET | Freq: Every day | ORAL | 3 refills | Status: DC

## 2020-01-09 NOTE — Telephone Encounter (Signed)
-----   Message from Liliane Shi, [place] sent at 01/09/2020  8:28 AM EST ----- Pls see Myoview and Echocardiogram results  Creatinine, K+ normal.  NT-Pro BNP slightly improved.   I think she would feel better on a higher dose of Furosemide. PLAN:  - Increase Furosemide to 40 mg once daily  - Increase K+ to 20 mEq once daily  - BMET 1 week. - Keep appt with Dr. Acie Fredrickson later this month - Call for sooner f/u if shortness of breath is not improving or is getting worse. Richardson Dopp, PA-C    01/09/2020 8:22 AM

## 2020-01-09 NOTE — Telephone Encounter (Signed)
The patient has been notified of the result and verbalized understanding.  All questions (if any) were answered. Antonieta Iba, RN 01/09/2020 8:56 AM  Patient will increase furosemide for 40 mg daily and potassium to 20 meq daily. Repeat lab work on 11/16.

## 2020-01-11 ENCOUNTER — Telehealth: Payer: Self-pay | Admitting: Physician Assistant

## 2020-01-11 DIAGNOSIS — Z79899 Other long term (current) drug therapy: Secondary | ICD-10-CM

## 2020-01-11 DIAGNOSIS — E876 Hypokalemia: Secondary | ICD-10-CM

## 2020-01-11 DIAGNOSIS — I5030 Unspecified diastolic (congestive) heart failure: Secondary | ICD-10-CM

## 2020-01-11 MED ORDER — POTASSIUM CHLORIDE 40 MEQ/15ML (20%) PO SOLN: 7.5000 mL | Freq: Every day | ORAL | 1 refills | Status: DC

## 2020-01-11 NOTE — Telephone Encounter (Signed)
I spoke with Vaughan Basta in clinical department at [facility] and gave her information from C. Pavero, RPH.  Vaughan Basta confirms they have received new prescription and will fill for patient.  I placed call to patient to make her aware. Left message to call office

## 2020-01-11 NOTE — Telephone Encounter (Signed)
° ° ° °  Pt c/o medication issue:  1. Name of Medication: potassium chloride SA (KLOR-CON) 20 MEQ tablet  oxybutynin (DITROPAN) 5 MG tablet    2. How are you currently taking this medication (dosage and times per day)?   3. Are you having a reaction (difficulty breathing--STAT)?   4. What is your medication issue? Tammy from [facility] calling to follow up, she said they sent a fax order for clarification of these medication on 11/05. She said there's a potential drug interaction between these 2 meds. She wanted to clarify if they can go ahead and fill the medication or there's should be some changes. She said the nurse can leave them a detailed message as long as they provide their first and last name on the VM

## 2020-01-11 NOTE — Telephone Encounter (Signed)
"  Solid oral dosage forms of potassium chloride are contraindicated in patients with impaired gastric emptying (e.g., due to the effects of drugs such as many anticholinergics). Patients on drugs with substantial anticholinergic effects should avoid using any solid oral dosage form of potassium chloride. Agents with greater anticholinergic effects are likely of more concern than agents with lesser anticholinergic effects. Liquid or effervescent potassium preparations are possible alternatives."  Please let pharmacy and patient know that I have switched the patient's potasium tablets to liquid.

## 2020-01-11 NOTE — Telephone Encounter (Signed)
Patient made aware potassium has been changed to liquid form

## 2020-01-12 NOTE — Telephone Encounter (Signed)
Scott, thoughts on continuing to monitor electrolytes and hold off on potassium supplementation?

## 2020-01-12 NOTE — Telephone Encounter (Signed)
    Pt is calling, she said she spoke with pharmacy and the liquid potasium cost $107 and she said she can't afford that, she asked if there's any alternative medication

## 2020-01-14 NOTE — Telephone Encounter (Signed)
That is very expensive. Let's DC the K+. Keep lab visit 11/15 to repeat BMET so I can see what her potassium is now. Have her increase dietary K+. Repeat BMET 1 week. If her potassium is low, I will probably add Spironolactone. Richardson Dopp, PA-C    01/14/2020 3:33 PM

## 2020-01-15 ENCOUNTER — Other Ambulatory Visit: Payer: Self-pay

## 2020-01-15 ENCOUNTER — Other Ambulatory Visit: Payer: PPO

## 2020-01-15 ENCOUNTER — Telehealth: Payer: Self-pay | Admitting: Physician Assistant

## 2020-01-15 DIAGNOSIS — R0602 Shortness of breath: Secondary | ICD-10-CM | POA: Diagnosis not present

## 2020-01-15 DIAGNOSIS — I10 Essential (primary) hypertension: Secondary | ICD-10-CM | POA: Diagnosis not present

## 2020-01-15 LAB — BASIC METABOLIC PANEL
BUN/Creatinine Ratio: 18 (ref 12–28)
BUN: 15 mg/dL (ref 8–27)
CO2: 23 mmol/L (ref 20–29)
Calcium: 9.5 mg/dL (ref 8.7–10.3)
Chloride: 103 mmol/L (ref 96–106)
Creatinine, Ser: 0.83 mg/dL (ref 0.57–1.00)
GFR calc Af Amer: 81 mL/min/{1.73_m2} (ref >59)
GFR calc non Af Amer: 70 mL/min/{1.73_m2} (ref >59)
Glucose: 94 mg/dL (ref 65–99)
Potassium: 4.2 mmol/L (ref 3.5–5.2)
Sodium: 139 mmol/L (ref 134–144)

## 2020-01-15 NOTE — Telephone Encounter (Signed)
See previous phone message re: the pts K that has been discontinued.

## 2020-01-15 NOTE — Telephone Encounter (Signed)
Patient returning call.

## 2020-01-15 NOTE — Addendum Note (Signed)
Addended by: Stephani Police on: 01/15/2020 08:38 AM   Modules accepted: Orders

## 2020-01-15 NOTE — Telephone Encounter (Signed)
LMTCB

## 2020-01-15 NOTE — Telephone Encounter (Signed)
Pt c/o medication issue:  1. Name of Medication:  Potassium Chloride 40 MEQ/15ML (20%) SOLN oxybutynin (DITROPAN) 5 MG tablet  2. How are you currently taking this medication (dosage and times per day)? potassium 7.5 mL by mouth daily, oxybutynin 1 tablet 3 times daily  3. Are you having a reaction (difficulty breathing--STAT)? no  4. What is your medication issue? Patient states the liquid potassium is too expensive so she stopped taking it. She also states she called her PCP to ask if she can stop the oxybutynin. She states she is also going to call her insurance company today.

## 2020-01-15 NOTE — Telephone Encounter (Addendum)
Pt verbalized understanding of Scott's instructions and will be here for her labs this afternoon.

## 2020-01-16 ENCOUNTER — Other Ambulatory Visit: Payer: PPO

## 2020-01-17 NOTE — Telephone Encounter (Signed)
Patient is following up regarding lab work. She is scheduled for a repeat BMET on 01/22/20 and she would like to ensure this appointment is necessary. Please advise.

## 2020-01-17 NOTE — Telephone Encounter (Signed)
Patient should keep lab appointment on 11/22.  I placed call to patient and left message to call office

## 2020-01-17 NOTE — Telephone Encounter (Signed)
I spoke with patient and told her she should stop potassium and come in for lab work as planned on 11/22.

## 2020-01-17 NOTE — Telephone Encounter (Signed)
Kayla Hale is returning [name]'s call. I told the pt she advised her to keep her lab appointment that is scheduled, but the patient still requested a callback due to some questions. Please advise.

## 2020-01-22 ENCOUNTER — Telehealth: Payer: Self-pay | Admitting: Cardiovascular Disease

## 2020-01-22 ENCOUNTER — Other Ambulatory Visit: Payer: PPO | Admitting: *Deleted

## 2020-01-22 ENCOUNTER — Other Ambulatory Visit: Payer: Self-pay

## 2020-01-22 DIAGNOSIS — Z79899 Other long term (current) drug therapy: Secondary | ICD-10-CM

## 2020-01-22 DIAGNOSIS — I5030 Unspecified diastolic (congestive) heart failure: Secondary | ICD-10-CM

## 2020-01-22 DIAGNOSIS — E876 Hypokalemia: Secondary | ICD-10-CM

## 2020-01-22 LAB — BASIC METABOLIC PANEL
BUN/Creatinine Ratio: 36 — ABNORMAL HIGH (ref 12–28)
BUN: 27 mg/dL (ref 8–27)
CO2: 26 mmol/L (ref 20–29)
Calcium: 9.9 mg/dL (ref 8.7–10.3)
Chloride: 103 mmol/L (ref 96–106)
Creatinine, Ser: 0.75 mg/dL (ref 0.57–1.00)
GFR calc Af Amer: 91 mL/min/{1.73_m2} (ref >59)
GFR calc non Af Amer: 79 mL/min/{1.73_m2} (ref >59)
Glucose: 90 mg/dL (ref 65–99)
Potassium: 4.9 mmol/L (ref 3.5–5.2)
Sodium: 141 mmol/L (ref 134–144)

## 2020-01-22 NOTE — Telephone Encounter (Signed)
Returned the patients call and left a message to call back if needed.

## 2020-01-22 NOTE — Telephone Encounter (Signed)
Patient is requesting to discuss lab results. 

## 2020-01-29 ENCOUNTER — Telehealth: Payer: Self-pay | Admitting: Cardiovascular Disease

## 2020-01-29 NOTE — Telephone Encounter (Signed)
New Message   Pt c/o medication issue:  1. Name of Medication: furosemide (LASIX) 40 MG tablet  2. How are you currently taking this medication (dosage and times per day)? Pt is taking 20mg  1x daily   3. Are you having a reaction (difficulty breathing--STAT)? No   4. What is your medication issue? Pt is calling and is wondering if she can cut the 40mg  tablets she has in half. She is also asking for a new prescription to be written for the 20mg  and sent to her mail order pharmacy    [facility] ([place], [place]  Please advise

## 2020-01-30 ENCOUNTER — Ambulatory Visit: Payer: PPO | Admitting: Cardiovascular Disease

## 2020-01-30 MED ORDER — FUROSEMIDE 20 MG PO TABS: 20.0000 mg | ORAL_TABLET | Freq: Every day | ORAL | 1 refills | Status: DC

## 2020-01-30 NOTE — Progress Notes (Signed)
Virtual Visit via Telephone Note   This visit type was conducted due to national recommendations for restrictions regarding the COVID-19 Pandemic (e.g. social distancing) in an effort to limit this patient's exposure and mitigate transmission in our community.  Due to her co-morbid illnesses, this patient is at least at moderate risk for complications without adequate follow up.  This format is felt to be most appropriate for this patient at this time.  The patient did not have access to video technology/had technical difficulties with video requiring transitioning to audio format only (telephone).  All issues noted in this document were discussed and addressed.  No physical exam could be performed with this format.  Please refer to the patient's chart for her  consent to telehealth for [facility].    Date:  01/31/2020   ID:  Kayla Hale, DOB 07-22-1945, MRN [id number] The patient was identified using 2 identifiers.  Patient Location: Home Provider Location: Home Office  PCP:  Lind Guest, NP  Cardiologist:  Mertie Moores, MD   Electrophysiologist:  None   Evaluation Performed:  Follow-Up Visit  Chief Complaint:  Follow-up (CHF, AFib)    Patient Profile: Kayla Hale is a 74 y.o. female with:  Permanent atrial fibrillation   Heart failure with preserved ejection fraction   Echocardiogram 11/21: EF 50-55, RVSP 33.8, mild reduced RVSF  Mild AI (echocardiogram 10/21)  Hypertension   Fhx of CAD   Myoview 10/21: no ischemia or scar   Hyperlipidemia   GERD   Hypothyroidism   Prior CV studies: Echocardiogram 01/08/20 EF 50-55, no RWMA, mild reduced RVSF, RVSP 33.8, mod BAE, trivial MR, mild AI  Myoview 01/08/20 EF 42, no ischemia or scar, intermediate risk due to low EF  Echocardiogram 12/06/13 Mild LVH, EF 55-60, no RWMA, mild AI, mild MR, severe LAE, mild RAE, mild TR, PASP 20    History of Present Illness:   [name] was last seen in 10/21.  She  had symptoms of shortness of breath.  A NT-Pro BNP was elevated and she was started on Furosemide.  An echocardiogram demonstrated low normal EF.  A Myoview was neg for ischemia or scar.  She is seen for f/u.  Since starting on furosemide, her breathing has improved significantly.  She was able to take her friend to the doctor yesterday and go out to eat.  This used to be a fairly big struggle for her but she had no issues yesterday.  She has not had chest discomfort or leg swelling.  Past Medical History:  Diagnosis Date  . Atrial fibrillation ([place])   . Chronic kidney disease    STAGE 2  . Diverticulosis 2004  . Dyslipidemia   . Gallstones   . GERD (gastroesophageal reflux disease)   . Heart failure with preserved EF 01/09/2020   Echocardiogram 11/21: EF 50-5, no RWMA, mild reduced RVSF, RVSP 33.8, mod BAE, trivial MR, mild AI  . Hyperlipidemia   . Hypertension   . Hypothyroidism   . Mild anemia   . Mild obesity   . Nuclear stress test    Myoview 11/21: min apical thinning, no scar or ischemia, EF 42; intermediate risk due to calculated EF (EF looks better than calculated).  . Seasonal allergies   . Shingles 2005   LOWER BACK  . Thyroid disease   . Urinary incontinence    Past Surgical History:  Procedure Laterality Date  . CHOLECYSTECTOMY N/A 03/16/2014   Procedure: LAPAROSCOPIC CHOLECYSTECTOMY WITH INTRAOPERATIVE CHOLANGIOGRAM;  Surgeon: Rolm Bookbinder, MD;  Location: [facility];  Service: General;  Laterality: N/A;  . COLONOSCOPY    . TUBAL LIGATION       Current Meds  Medication Sig  . ALPRAZolam (XANAX) 0.5 MG tablet Take 0.5 mg by mouth daily.   Marland Kitchen apixaban (ELIQUIS) 5 MG TABS tablet Take 1 tablet (5 mg total) by mouth 2 (two) times daily.  . Ascorbic Acid (VITAMIN C) 1000 MG tablet Take 1,000 mg by mouth daily.  . Calcium Carbonate-Vitamin D (CALCIUM-VITAMIN D) 500-200 MG-UNIT per tablet Take 1 tablet by mouth daily.  . cholecalciferol (VITAMIN D) 1000 UNITS tablet Take  1,000 Units by mouth daily.  . colestipol (COLESTID) 1 g tablet Take 1 g by mouth 2 (two) times daily.  . fluticasone (FLONASE) 50 MCG/ACT nasal spray Place 1 spray into both nostrils daily as needed for allergies or rhinitis.   . furosemide (LASIX) 20 MG tablet Take 1 tablet (20 mg total) by mouth daily.  Marland Kitchen levothyroxine (SYNTHROID, LEVOTHROID) 75 MCG tablet Take 75 mcg by mouth daily before breakfast.  . MAGNESIUM-ZINC PO Take 1 tablet by mouth daily.  . metoprolol tartrate (LOPRESSOR) 50 MG tablet Take 1 tablet (50 mg total) by mouth 2 (two) times daily.  . Misc Natural Products (AIRBORNE ELDERBERRY PO) Take 1 tablet by mouth daily.  . Multiple Vitamin (MULTIVITAMIN) tablet Take 1 tablet by mouth daily.  . Omega-3 Fatty Acids (FISH OIL) 1000 MG CAPS Take 1 capsule by mouth daily.  Marland Kitchen omeprazole (PRILOSEC) 20 MG capsule Take 20 mg by mouth daily.  Marland Kitchen oxybutynin (DITROPAN) 5 MG tablet Take 5 mg by mouth 3 (three) times daily.  . pravastatin (PRAVACHOL) 20 MG tablet Take 20 mg by mouth daily.  Marland Kitchen UNABLE TO FIND cbd oil qhs     Allergies:   Lisinopril and Norvasc [amlodipine besylate]   Social History   Tobacco Use  . Smoking status: Never Smoker  . Smokeless tobacco: Never Used  Vaping Use  . Vaping Use: Never used  Substance Use Topics  . Alcohol use: No  . Drug use: No     Family Hx: The patient's family history includes CVA in her brother; Diabetes in her brother and sister; Heart attack in her father and sister; Hypertension in her father.  ROS:   Please see the history of present illness.      Labs/Other Tests and Data Reviewed:    EKG:  No ECG reviewed.  Recent Labs: 12/20/2019: Hemoglobin 12.1; Platelets 233 01/08/2020: NT-Pro BNP 1,134 01/22/2020: BUN 27; Creatinine, Ser 0.75; Potassium 4.9; Sodium 141   Recent Lipid Panel No results found for: CHOL, TRIG, HDL, CHOLHDL, LDLCALC, LDLDIRECT  Wt Readings from Last 3 Encounters:  01/31/20 201 lb (91.2 kg)  01/08/20  205 lb (93 kg)  12/20/19 205 lb 6.4 oz (93.2 kg)     Risk Assessment/Calculations:       CHA2DS2-VASc Score = 4  This indicates a 4.8% annual risk of stroke. The patient's score is based upon: CHF History: 1 HTN History: 1 Diabetes History: 0 Stroke History: 0 Vascular Disease History: 0 Age Score: 1 Gender Score: 1     Objective:    Vital Signs:  Ht 5\' 6"  (1.676 m)   Wt 201 lb (91.2 kg)   BMI 32.44 kg/m    VITAL SIGNS:  reviewed GEN:  no acute distress PSYCH:  normal affect  ASSESSMENT & PLAN:    1. Heart failure with preserved ejection fraction Recent  echocardiogram with normal EF.  She has had significant improvement with low-dose furosemide.  Continue current dose of furosemide.  We discussed the importance of limiting salt and obtaining daily weights.  We discussed when she would take extra furosemide.  Follow-up 6 months.  2. Permanent atrial fibrillation (HCC) Tolerating anticoagulation.  Continue current dose of Apixaban, metoprolol tartrate.  Recent creatinine and hemoglobin stable.  3. Aortic valve insufficiency, etiology of cardiac valve disease unspecified Mild aortic insufficiency by recent echocardiogram.  This is unchanged from the echocardiogram in 2015.  She does not have a  4. Essential hypertension She does not have a blood pressure cuff at home.  But her blood pressure does typically run fairly normal.  Continue current therapy.     Time:   Today, I have spent 13 minutes with the patient with telehealth technology discussing the above problems.     Medication Adjustments/Labs and Tests Ordered: Current medicines are reviewed at length with the patient today.  Concerns regarding medicines are outlined above.   Tests Ordered: No orders of the defined types were placed in this encounter.   Medication Changes: No orders of the defined types were placed in this encounter.   Follow Up:  In Person in 6 month(s)  Signed, Richardson Dopp, PA-C    01/31/2020 4:36 PM    [facility]

## 2020-01-30 NOTE — Telephone Encounter (Signed)
New prescription for Furosemide 20 mg sent to patients pharmacy.

## 2020-01-30 NOTE — Telephone Encounter (Signed)
Ok to cut 40 mg tabs in 1/2 to = 20 mg. Please send in Rx for 20 mg tabs to her pharmacy. Richardson Dopp, PA-C    01/30/2020 1:24 PM

## 2020-01-31 ENCOUNTER — Encounter: Payer: Self-pay | Admitting: Physician Assistant

## 2020-01-31 ENCOUNTER — Telehealth: Payer: Self-pay | Admitting: *Deleted

## 2020-01-31 ENCOUNTER — Telehealth (INDEPENDENT_AMBULATORY_CARE_PROVIDER_SITE_OTHER): Payer: PPO | Admitting: Physician Assistant

## 2020-01-31 ENCOUNTER — Other Ambulatory Visit: Payer: Self-pay

## 2020-01-31 VITALS — Ht 66.0 in | Wt 201.0 lb

## 2020-01-31 DIAGNOSIS — I10 Essential (primary) hypertension: Secondary | ICD-10-CM | POA: Diagnosis not present

## 2020-01-31 DIAGNOSIS — I4821 Permanent atrial fibrillation: Secondary | ICD-10-CM

## 2020-01-31 DIAGNOSIS — I5030 Unspecified diastolic (congestive) heart failure: Secondary | ICD-10-CM

## 2020-01-31 DIAGNOSIS — I351 Nonrheumatic aortic (valve) insufficiency: Secondary | ICD-10-CM | POA: Diagnosis not present

## 2020-01-31 NOTE — Telephone Encounter (Signed)
  Patient Consent for Virtual Visit         Kayla Hale has provided verbal consent on 01/31/2020 for a virtual visit (video or telephone).   CONSENT FOR VIRTUAL VISIT FOR:  Kayla Hale  By participating in this virtual visit I agree to the following:  I hereby voluntarily request, consent and authorize [place] and its employed or contracted physicians, physician assistants, nurse practitioners or other licensed health care professionals (the Practitioner), to provide me with telemedicine health care services (the "Services") as deemed necessary by the treating Practitioner. I acknowledge and consent to receive the Services by the Practitioner via telemedicine. I understand that the telemedicine visit will involve communicating with the Practitioner through live audiovisual communication technology and the disclosure of certain medical information by electronic transmission. I acknowledge that I have been given the opportunity to request an in-person assessment or other available alternative prior to the telemedicine visit and am voluntarily participating in the telemedicine visit.  I understand that I have the right to withhold or withdraw my consent to the use of telemedicine in the course of my care at any time, without affecting my right to future care or treatment, and that the Practitioner or I may terminate the telemedicine visit at any time. I understand that I have the right to inspect all information obtained and/or recorded in the course of the telemedicine visit and may receive copies of available information for a reasonable fee.  I understand that some of the potential risks of receiving the Services via telemedicine include:  Marland Kitchen Delay or interruption in medical evaluation due to technological equipment failure or disruption; . Information transmitted may not be sufficient (e.g. poor resolution of images) to allow for appropriate medical decision making by the  Practitioner; and/or  . In rare instances, security protocols could fail, causing a breach of personal health information.  Furthermore, I acknowledge that it is my responsibility to provide information about my medical history, conditions and care that is complete and accurate to the best of my ability. I acknowledge that Practitioner's advice, recommendations, and/or decision may be based on factors not within their control, such as incomplete or inaccurate data provided by me or distortions of diagnostic images or specimens that may result from electronic transmissions. I understand that the practice of medicine is not an exact science and that Practitioner makes no warranties or guarantees regarding treatment outcomes. I acknowledge that a copy of this consent can be made available to me via my patient portal ([place]), or I can request a printed copy by calling the office of [place].    I understand that my insurance will be billed for this visit.   I have read or had this consent read to me. . I understand the contents of this consent, which adequately explains the benefits and risks of the Services being provided via telemedicine.  . I have been provided ample opportunity to ask questions regarding this consent and the Services and have had my questions answered to my satisfaction. . I give my informed consent for the services to be provided through the use of telemedicine in my medical care

## 2020-01-31 NOTE — Telephone Encounter (Deleted)
Patient Consent for Virtual Visit   { TIP  Anything in RED or BLUE will delete when you sign your note. There is no need to delete it or select it by pressing F2.   Please read everything in BLUE to the patient.        :[id number]} { PLEASE READ THE FOLLOWING TO THE PATIENT. Then, scroll to the question in red below. If the patient has questions about consent, refer to and read the consent below,    CONSENT FOR VIRTUAL VISIT.  Kayla Hale, you are scheduled for a virtual visit with your provider today.  Just as we do with appointments in the office, we must obtain your consent to participate.  Your consent will be active for this visit and any virtual visit you may have with one of our providers in the next 365 days.  If you have a MyChart account, I can also send a copy of this consent to you electronically.  All virtual visits are billed to your insurance company just like a traditional visit in the office.  As this is a virtual visit, video technology does not allow for your provider to perform a traditional examination.  This may limit your provider's ability to fully assess your condition.  If your provider identifies any concerns that need to be evaluated in person or the need to arrange testing such as labs, EKG, etc, we will make arrangements to do so.  Although advances in technology are sophisticated, we cannot ensure that it will always work on either your end or our end.  If the connection with a video visit is poor, we may have to switch to a telephone visit.  With either a video or telephone visit, we are not always able to ensure that we have a secure connection.   I need to obtain your verbal consent now.   Are you willing to proceed with your visit today?        :[id number]}  {  Did the patient verbally consent to the visit?  Place your cursor on the next line. Then, press F2 or Fn plus F2 to select the list below.  Then, choose YES or NO.  :[id number]}   {Click here.  Press F2 and  choose YES or NO                  :[id number]}   CONSENT FOR VIRTUAL VISIT FOR:  Kayla Hale  By participating in this virtual visit I agree to the following:  I hereby voluntarily request, consent and authorize [place] and its employed or contracted physicians, physician assistants, nurse practitioners or other licensed health care professionals (the Practitioner), to provide me with telemedicine health care services (the "Services") as deemed necessary by the treating Practitioner. I acknowledge and consent to receive the Services by the Practitioner via telemedicine. I understand that the telemedicine visit will involve communicating with the Practitioner through live audiovisual communication technology and the disclosure of certain medical information by electronic transmission. I acknowledge that I have been given the opportunity to request an in-person assessment or other available alternative prior to the telemedicine visit and am voluntarily participating in the telemedicine visit.  I understand that I have the right to withhold or withdraw my consent to the use of telemedicine in the course of my care at any time, without affecting my right to future care or treatment, and that the Practitioner or I may terminate the telemedicine visit at any time. I  understand that I have the right to inspect all information obtained and/or recorded in the course of the telemedicine visit and may receive copies of available information for a reasonable fee.  I understand that some of the potential risks of receiving the Services via telemedicine include:  Marland Kitchen Delay or interruption in medical evaluation due to technological equipment failure or disruption; . Information transmitted may not be sufficient (e.g. poor resolution of images) to allow for appropriate medical decision making by the Practitioner; and/or  . In rare instances, security protocols could fail, causing a breach of personal health  information.  Furthermore, I acknowledge that it is my responsibility to provide information about my medical history, conditions and care that is complete and accurate to the best of my ability. I acknowledge that Practitioner's advice, recommendations, and/or decision may be based on factors not within their control, such as incomplete or inaccurate data provided by me or distortions of diagnostic images or specimens that may result from electronic transmissions. I understand that the practice of medicine is not an exact science and that Practitioner makes no warranties or guarantees regarding treatment outcomes. I acknowledge that a copy of this consent can be made available to me via my patient portal ([place]), or I can request a printed copy by calling the office of [place].    I understand that my insurance will be billed for this visit.   I have read or had this consent read to me. . I understand the contents of this consent, which adequately explains the benefits and risks of the Services being provided via telemedicine.  . I have been provided ample opportunity to ask questions regarding this consent and the Services and have had my questions answered to my satisfaction. . I give my informed consent for the services to be provided through the use of telemedicine in my medical care

## 2020-01-31 NOTE — Patient Instructions (Addendum)
Medication Instructions:  Your physician has recommended you make the following change in your medication:   1) Take extra furosemide 20 mg if weight increases 3 pounds or more in 1 day or 5 pounds or more in 1 week   *If you need a refill on your cardiac medications before your next appointment, please call your pharmacy*  Lab Work: None ordered today  Testing/Procedures: None Ordered today  Follow-Up: At [facility], you and your health needs are our priority.  As part of our continuing mission to provide you with exceptional heart care, we have created designated Provider Care Teams.  These Care Teams include your primary Cardiologist (physician) and Advanced Practice Providers (APPs -  Physician Assistants and Nurse Practitioners) who all work together to provide you with the care you need, when you need it.  Your next appointment:   6 month(s)  The format for your next appointment:   In Person  Provider:   Mertie Moores, MD  Other Instructions: Weigh daily   Two Gram Sodium Diet 2000 mg  What is Sodium? Sodium is a mineral found naturally in many foods. The most significant source of sodium in the diet is table salt, which is about 40% sodium.  Processed, convenience, and preserved foods also contain a large amount of sodium.  The body needs only 500 mg of sodium daily to function,  A normal diet provides more than enough sodium even if you do not use salt.  Why Limit Sodium? A build up of sodium in the body can cause thirst, increased blood pressure, shortness of breath, and water retention.  Decreasing sodium in the diet can reduce edema and risk of heart attack or stroke associated with high blood pressure.  Keep in mind that there are many other factors involved in these health problems.  Heredity, obesity, lack of exercise, cigarette smoking, stress and what you eat all play a role.  General Guidelines:  Do not add salt at the table or in cooking.  One teaspoon of  salt contains over 2 grams of sodium.  Read food labels  Avoid processed and convenience foods  Ask your dietitian before eating any foods not dicussed in the menu planning guidelines  Consult your physician if you wish to use a salt substitute or a sodium containing medication such as antacids.  Limit milk and milk products to 16 oz (2 cups) per day.  Shopping Hints:  READ LABELS!! "Dietetic" does not necessarily mean low sodium.  Salt and other sodium ingredients are often added to foods during processing.   Menu Planning Guidelines Food Group Choose More Often Avoid  Beverages (see also the milk group All fruit juices, low-sodium, salt-free vegetables juices, low-sodium carbonated beverages Regular vegetable or tomato juices, commercially softened water used for drinking or cooking  Breads and Cereals Enriched white, wheat, rye and pumpernickel bread, hard rolls and dinner rolls; muffins, cornbread and waffles; most dry cereals, cooked cereal without added salt; unsalted crackers and breadsticks; low sodium or homemade bread crumbs Bread, rolls and crackers with salted tops; quick breads; instant hot cereals; pancakes; commercial bread stuffing; self-rising flower and biscuit mixes; regular bread crumbs or cracker crumbs  Desserts and Sweets Desserts and sweets mad with mild should be within allowance Instant pudding mixes and cake mixes  Fats Butter or margarine; vegetable oils; unsalted salad dressings, regular salad dressings limited to 1 Tbs; light, sour and heavy cream Regular salad dressings containing bacon fat, bacon bits, and salt pork; snack dips  made with instant soup mixes or processed cheese; salted nuts  Fruits Most fresh, frozen and canned fruits Fruits processed with salt or sodium-containing ingredient (some dried fruits are processed with sodium sulfites        Vegetables Fresh, frozen vegetables and low- sodium canned vegetables Regular canned vegetables,  sauerkraut, pickled vegetables, and others prepared in brine; frozen vegetables in sauces; vegetables seasoned with ham, bacon or salt pork  Condiments, Sauces, Miscellaneous  Salt substitute with physician's approval; pepper, herbs, spices; vinegar, lemon or lime juice; hot pepper sauce; garlic powder, onion powder, low sodium soy sauce (1 Tbs.); low sodium condiments (ketchup, chili sauce, mustard) in limited amounts (1 tsp.) fresh ground horseradish; unsalted tortilla chips, pretzels, potato chips, popcorn, salsa (1/4 cup) Any seasoning made with salt including garlic salt, celery salt, onion salt, and seasoned salt; sea salt, rock salt, kosher salt; meat tenderizers; monosodium glutamate; mustard, regular soy sauce, barbecue, sauce, chili sauce, teriyaki sauce, steak sauce, Worcestershire sauce, and most flavored vinegars; canned gravy and mixes; regular condiments; salted snack foods, olives, picles, relish, horseradish sauce, catsup   Food preparation: Try these seasonings Meats:    Pork Sage, onion Serve with applesauce  Chicken Poultry seasoning, thyme, parsley Serve with cranberry sauce  Lamb Curry powder, rosemary, garlic, thyme Serve with mint sauce or jelly  Veal Marjoram, basil Serve with current jelly, cranberry sauce  Beef Pepper, bay leaf Serve with dry mustard, unsalted chive butter  Fish [place] leaf, dill Serve with unsalted lemon butter, unsalted parsley butter  Vegetables:    Asparagus Lemon juice   Broccoli Lemon juice   Carrots Mustard dressing parsley, mint, nutmeg, glazed with unsalted butter and sugar   Green beans Marjoram, lemon juice, nutmeg,dill seed   Tomatoes Basil, marjoram, onion   Spice /blend for [employer]" 4 tsp ground thyme 1 tsp ground sage 3 tsp ground rosemary 4 tsp ground marjoram   Test your knowledge 1. A product that says "Salt Free" may still contain sodium. True or False 2. Garlic Powder and Hot Pepper Sauce an be used as alternative  seasonings.True or False 3. Processed foods have more sodium than fresh foods.  True or False 4. Canned Vegetables have less sodium than froze True or False  WAYS TO DECREASE YOUR SODIUM INTAKE 1. Avoid the use of added salt in cooking and at the table.  Table salt (and other prepared seasonings which contain salt) is probably one of the greatest sources of sodium in the diet.  Unsalted foods can gain flavor from the sweet, sour, and butter taste sensations of herbs and spices.  Instead of using salt for seasoning, try the following seasonings with the foods listed.  Remember: how you use them to enhance natural food flavors is limited only by your creativity... Allspice-Meat, fish, eggs, fruit, peas, red and yellow vegetables Almond Extract-Fruit baked goods Anise Seed-Sweet breads, fruit, carrots, beets, cottage cheese, cookies (tastes like licorice) Basil-Meat, fish, eggs, vegetables, rice, vegetables salads, soups, sauces Bay Leaf-Meat, fish, stews, poultry Burnet-Salad, vegetables (cucumber-like flavor) Caraway Seed-Bread, cookies, cottage cheese, meat, vegetables, cheese, rice Cardamon-Baked goods, fruit, soups Celery Powder or seed-Salads, salad dressings, sauces, meatloaf, soup, bread.Do not use  celery salt Chervil-Meats, salads, fish, eggs, vegetables, cottage cheese (parsley-like flavor) Chili Power-Meatloaf, chicken cheese, corn, eggplant, egg dishes Chives-Salads cottage cheese, egg dishes, soups, vegetables, sauces Cilantro-Salsa, casseroles Cinnamon-Baked goods, fruit, pork, lamb, chicken, carrots Cloves-Fruit, baked goods, fish, pot roast, green beans, beets, carrots Coriander-Pastry, cookies, meat, salads, cheese (lemon-orange flavor)  Cumin-Meatloaf, fish,cheese, eggs, cabbage,fruit pie (caraway flavor) [employer], fruit, eggs, fish, poultry, cottage cheese, vegetables Dill Seed-Meat, cottage cheese, poultry, vegetables, fish, salads, bread Fennel Seed-Bread, cookies,  apples, pork, eggs, fish, beets, cabbage, cheese, Licorice-like flavor Garlic-(buds or powder) Salads, meat, poultry, fish, bread, butter, vegetables, potatoes.Do not  use garlic salt Ginger-Fruit, vegetables, baked goods, meat, fish, poultry Horseradish Root-Meet, vegetables, butter Lemon Juice or Extract-Vegetables, fruit, tea, baked goods, fish salads Mace-Baked goods fruit, vegetables, fish, poultry (taste like nutmeg) Maple Extract-Syrups Marjoram-Meat, chicken, fish, vegetables, breads, green salads (taste like Sage) Mint-Tea, lamb, sherbet, vegetables, desserts, carrots, cabbage Mustard, Dry or Seed-Cheese, eggs, meats, vegetables, poultry Nutmeg-Baked goods, fruit, chicken, eggs, vegetables, desserts Onion Powder-Meat, fish, poultry, vegetables, cheese, eggs, bread, rice salads (Do not use   Onion salt) Orange Extract-Desserts, baked goods Oregano-Pasta, eggs, cheese, onions, pork, lamb, fish, chicken, vegetables, green salads Paprika-Meat, fish, poultry, eggs, cheese, vegetables Parsley Flakes-Butter, vegetables, meat fish, poultry, eggs, bread, salads (certain forms may   Contain sodium Pepper-Meat fish, poultry, vegetables, eggs Peppermint Extract-Desserts, baked goods Poppy Seed-Eggs, bread, cheese, fruit dressings, baked goods, noodles, vegetables, cottage  [employer], poultry, meat, fish, cauliflower, turnips,eggs bread Saffron-Rice, bread, veal, chicken, fish, eggs Sage-Meat, fish, poultry, onions, eggplant, tomateos, pork, stews Savory-Eggs, salads, poultry, meat, rice, vegetables, soups, pork Tarragon-Meat, poultry, fish, eggs, butter, vegetables (licorice-like flavor)  Thyme-Meat, poultry, fish, eggs, vegetables, (clover-like flavor), sauces, soups Tumeric-Salads, butter, eggs, fish, rice, vegetables (saffron-like flavor) Vanilla Extract-Baked goods, candy Vinegar-Salads, vegetables, meat marinades Walnut Extract-baked goods,  candy  2. Choose your Foods Wisely   The following is a list of foods to avoid which are high in sodium:  Meats-Avoid all smoked, canned, salt cured, dried and kosher meat and fish as well as Anchovies   [employer] meats:Bologna, Liverwurst, Pastrami Canned meat or fish  Marinated herring Caviar    Pepperoni Corned Beef   Pizza Dried chipped beef  Salami Frozen breaded fish or meat Salt pork Frankfurters or hot dogs  Sardines Gefilte fish   Sausage Ham (boiled ham, Proscuitto Smoked butt    spiced ham)   Spam      TV Dinners Vegetables Canned vegetables (Regular) Relish Canned mushrooms  Sauerkraut Olives    Tomato juice Pickles  Bakery and Dessert Products Canned puddings  Cream pies Cheesecake   Decorated cakes Cookies  Beverages/Juices Tomato juice, regular  Gatorade   V-8 vegetable juice, regular  Breads and Cereals Biscuit mixes   Salted potato chips, corn chips, pretzels Bread stuffing mixes  Salted crackers and rolls Pancake and waffle mixes Self-rising flour  Seasonings Accent    Meat sauces Barbecue sauce  Meat tenderizer Catsup    Monosodium glutamate (MSG) Celery salt   Onion salt Chili sauce   Prepared mustard Garlic salt   Salt, seasoned salt, sea salt Gravy mixes   Soy sauce Horseradish   Steak sauce Ketchup   Tartar sauce Lite salt    Teriyaki sauce Marinade mixes   Worcestershire sauce  Others Baking powder   Cocoa and cocoa mixes Baking soda   Commercial casserole mixes Candy-caramels, chocolate  Dehydrated soups    Bars, fudge,nougats  Instant rice and pasta mixes Canned broth or soup  Maraschino cherries Cheese, aged and processed cheese and cheese spreads  Learning Assessment Quiz  Indicated T (for True) or F (for False) for each of the following statements:  1. _____ Fresh fruits and vegetables and unprocessed grains are generally low in sodium  2. _____ Water may contain a considerable amount of sodium, depending on the  source 3. _____ You can always tell if a food is high in sodium by tasting it 4. _____ Certain laxatives my be high in sodium and should be avoided unless prescribed   by a physician or pharmacist 5. _____ Salt substitutes may be used freely by anyone on a sodium restricted diet 6. _____ Sodium is present in table salt, food additives and as a natural component of   most foods 7. _____ Table salt is approximately 90% sodium 8. _____ Limiting sodium intake may help prevent excess fluid accumulation in the body 9. _____ On a sodium-restricted diet, seasonings such as bouillon soy sauce, and    cooking wine should be used in place of table salt 10. _____ On an ingredient list, a product which lists monosodium glutamate as the first   ingredient is an appropriate food to include on a low sodium diet  Circle the best answer(s) to the following statements (Hint: there may be more than one correct answer)  11. On a low-sodium diet, some acceptable snack items are:    A. Olives  F. Bean dip   K. Grapefruit juice    B. Salted Pretzels G. Commercial Popcorn   L. Canned peaches    C. Carrot Sticks  H. Bouillon   M. Unsalted nuts   D. [place] fries  I. Peanut butter crackers N. Salami   E. Sweet pickles J. Tomato Juice   O. Pizza  12.  Seasonings that may be used freely on a reduced - sodium diet include   A. Lemon wedges F.Monosodium glutamate K. Celery seed    B.Soysauce   G. Pepper   L. Mustard powder   C. Sea salt  H. Cooking wine  M. Onion flakes   D. Vinegar  E. Prepared horseradish N. Salsa   E. Sage   J. Worcestershire sauce  O. Chutney

## 2020-03-05 DIAGNOSIS — D696 Thrombocytopenia, unspecified: Secondary | ICD-10-CM | POA: Diagnosis not present

## 2020-03-05 DIAGNOSIS — E7800 Pure hypercholesterolemia, unspecified: Secondary | ICD-10-CM | POA: Diagnosis not present

## 2020-03-05 DIAGNOSIS — N3941 Urge incontinence: Secondary | ICD-10-CM | POA: Diagnosis not present

## 2020-03-05 DIAGNOSIS — E039 Hypothyroidism, unspecified: Secondary | ICD-10-CM | POA: Diagnosis not present

## 2020-03-05 DIAGNOSIS — Z79899 Other long term (current) drug therapy: Secondary | ICD-10-CM | POA: Diagnosis not present

## 2020-03-05 DIAGNOSIS — Z1331 Encounter for screening for depression: Secondary | ICD-10-CM | POA: Diagnosis not present

## 2020-03-05 DIAGNOSIS — I10 Essential (primary) hypertension: Secondary | ICD-10-CM | POA: Diagnosis not present

## 2020-03-05 DIAGNOSIS — Z131 Encounter for screening for diabetes mellitus: Secondary | ICD-10-CM | POA: Diagnosis not present

## 2020-03-05 DIAGNOSIS — Z1339 Encounter for screening examination for other mental health and behavioral disorders: Secondary | ICD-10-CM | POA: Diagnosis not present

## 2020-03-05 DIAGNOSIS — Z0001 Encounter for general adult medical examination with abnormal findings: Secondary | ICD-10-CM | POA: Diagnosis not present

## 2020-03-05 DIAGNOSIS — E2839 Other primary ovarian failure: Secondary | ICD-10-CM | POA: Diagnosis not present

## 2020-03-05 DIAGNOSIS — Z6834 Body mass index (BMI) 34.0-34.9, adult: Secondary | ICD-10-CM | POA: Diagnosis not present

## 2020-03-28 DIAGNOSIS — D649 Anemia, unspecified: Secondary | ICD-10-CM | POA: Diagnosis not present

## 2020-04-11 DIAGNOSIS — L821 Other seborrheic keratosis: Secondary | ICD-10-CM | POA: Diagnosis not present

## 2020-04-11 DIAGNOSIS — D1801 Hemangioma of skin and subcutaneous tissue: Secondary | ICD-10-CM | POA: Diagnosis not present

## 2020-04-11 DIAGNOSIS — L814 Other melanin hyperpigmentation: Secondary | ICD-10-CM | POA: Diagnosis not present

## 2020-04-12 ENCOUNTER — Other Ambulatory Visit: Payer: Self-pay | Admitting: *Deleted

## 2020-04-12 MED ORDER — APIXABAN 5 MG PO TABS
5.0000 mg | ORAL_TABLET | Freq: Two times a day (BID) | ORAL | 1 refills | Status: DC
Start: 2020-04-12 — End: 2020-12-24

## 2020-04-12 NOTE — Telephone Encounter (Signed)
Prescription refill request for Eliquis received.   Indication: Afib Last office visit: 01/31/2020, Kathlen Mody Scr: 0.75, 01/22/2020 Age: 75 yo  Weight: 91.2 kg   Prescription refill sent.

## 2020-04-16 ENCOUNTER — Other Ambulatory Visit: Payer: Self-pay

## 2020-04-16 MED ORDER — METOPROLOL TARTRATE 50 MG PO TABS
50.0000 mg | ORAL_TABLET | Freq: Two times a day (BID) | ORAL | 3 refills | Status: DC
Start: 2020-04-16 — End: 2020-04-19

## 2020-04-18 ENCOUNTER — Other Ambulatory Visit: Payer: Self-pay | Admitting: Cardiovascular Disease

## 2020-04-19 ENCOUNTER — Telehealth: Payer: Self-pay | Admitting: Cardiovascular Disease

## 2020-04-19 ENCOUNTER — Other Ambulatory Visit: Payer: Self-pay

## 2020-04-19 MED ORDER — METOPROLOL TARTRATE 50 MG PO TABS
50.0000 mg | ORAL_TABLET | Freq: Two times a day (BID) | ORAL | 1 refills | Status: DC
Start: 2020-04-19 — End: 2021-01-13

## 2020-04-19 NOTE — Telephone Encounter (Signed)
Patient calling in complaining of new onset joint pain that began about 6 months ago.   Patient has been taking 20 mg Pravastatin for the past 25+ years.  She thinks that this medication has started causing her joint pain.   She is going to schedule with an Orthopedic doctor as well to be evaluated.   Patient requesting to change her cholesterol medication to something other than a statin in hopes that it will decrease her joint pain.   Will send to Dr. Acie Fredrickson and PharmD for advisement.

## 2020-04-19 NOTE — Telephone Encounter (Signed)
Agree to check lipids .  If the chol levels are significantly elevate, I think she would benefit from a PCSK9 inhibitor

## 2020-04-19 NOTE — Telephone Encounter (Signed)
Called patient.  Reports joint pain has been progressively getting worse so she d/c her pravastatin 5 days ago and has noticed her fingers have been feeling better.  Would like to try a new medication that is not a statin.  Do not see a current lipid panel on file and last panel in [place] is from 2019.  Patient says she had a PCP appointment a few weeks ago where they drew lab work. Does not know if lipid panel was included but she will fax over the results when she can.  Advised we can put in an order for a lipid panel if it was not included in PCP's labs and then discuss possible change in medication.

## 2020-04-19 NOTE — Telephone Encounter (Signed)
New message:    Patient would like to know if she can stop taken pravastatin she think it is messing her joints. Please call patient.

## 2020-04-19 NOTE — Telephone Encounter (Signed)
Hold Pravastatin for 3-4 weeks. If symptoms do not change, resume Pravastatin. If symptoms resolve, stop Pravastatin, start Ezetimibe 10 once daily; then arrange fasting Lipids and LFTs for 3 mos later. Richardson Dopp, PA-C    04/19/2020 12:52 PM

## 2020-04-19 NOTE — Telephone Encounter (Signed)
*  STAT* If patient is at the pharmacy, call can be transferred to refill team.   1. Which medications need to be refilled? (please list name of each medication and dose if known) Metoprolol   2. Which pharmacy/location (including street and city if local pharmacy) is medication to be sent to? Please send to [place]  3. Do they need a 30 day or 90 day supply? 30 day

## 2020-04-19 NOTE — Telephone Encounter (Signed)
Kayla Hale, Scott T, PA-C  You 25 minutes ago (12:53 PM)      Hold Pravastatin for 3-4 weeks. If symptoms do not change, resume Pravastatin. If symptoms resolve, stop Pravastatin, start Ezetimibe 10 once daily; then arrange fasting Lipids and LFTs for 3 mos later. Richardson Dopp, PA-C    04/19/2020 12:52 PM     Patient reports that she has been off her statin for the past five days. She is working on getting a recent lipid panel sent to our office (see 2/18 phone note from [facility], PharmD). Patient will call back with an update in 2-3 weeks letting us know how she feels off the Pravastatin.

## 2020-04-19 NOTE — Telephone Encounter (Signed)
Pt called In stating that she has been on Pravastatin for several years 20+ and has recently noticed joint pain in her hands and knees. She would like to try something different, other than a Statin.  Please call pt to discuss or she is also willing to make an appt if needed to discuss this with Dr Acie Fredrickson or Richardson Dopp PA.  Pt was last seen by Nicki Reaper via Telemedicine on 01/31/2020. Her last appt with Dr Acie Fredrickson was 06/23/2019.

## 2020-05-21 DIAGNOSIS — R112 Nausea with vomiting, unspecified: Secondary | ICD-10-CM | POA: Diagnosis not present

## 2020-05-21 DIAGNOSIS — N3000 Acute cystitis without hematuria: Secondary | ICD-10-CM | POA: Diagnosis not present

## 2020-05-21 DIAGNOSIS — R197 Diarrhea, unspecified: Secondary | ICD-10-CM | POA: Diagnosis not present

## 2020-05-21 DIAGNOSIS — B9620 Unspecified Escherichia coli [E. coli] as the cause of diseases classified elsewhere: Secondary | ICD-10-CM | POA: Diagnosis not present

## 2020-05-22 DIAGNOSIS — Z6833 Body mass index (BMI) 33.0-33.9, adult: Secondary | ICD-10-CM | POA: Diagnosis not present

## 2020-05-22 DIAGNOSIS — R197 Diarrhea, unspecified: Secondary | ICD-10-CM | POA: Diagnosis not present

## 2020-05-23 DIAGNOSIS — K921 Melena: Secondary | ICD-10-CM | POA: Diagnosis not present

## 2020-05-27 DIAGNOSIS — R197 Diarrhea, unspecified: Secondary | ICD-10-CM | POA: Diagnosis not present

## 2020-06-17 ENCOUNTER — Other Ambulatory Visit: Payer: Self-pay | Admitting: Physician Assistant

## 2020-06-20 ENCOUNTER — Telehealth: Payer: Self-pay | Admitting: Cardiovascular Disease

## 2020-06-20 DIAGNOSIS — Z79899 Other long term (current) drug therapy: Secondary | ICD-10-CM

## 2020-06-20 DIAGNOSIS — I5030 Unspecified diastolic (congestive) heart failure: Secondary | ICD-10-CM

## 2020-06-20 NOTE — Telephone Encounter (Signed)
RN returned call to patient who states she continues to not take the pravastatin due to joint pain. Patient states she never came to get her lab work checked and she was wondering if she should do that so that we can discuss starting a different medication if needed. Patient has an upcoming appt on 6/3. Patient also has questions regarding potassium, and states she was not sure if she needs to be taking a supplement now that she is taking furosemide every 3 days. RN reviewed medication list and furosemide is still listed as 20mg  daily. Patient unsure of when that was changed. RN advised I would send a message to Dr. Acie Fredrickson for recommendations. Patient verbalized understanding.

## 2020-06-20 NOTE — Telephone Encounter (Signed)
Patient called to say that she is not taken her cholesterol medicine and she would like to discuss it with the nurse.

## 2020-06-21 MED ORDER — POTASSIUM CHLORIDE ER 10 MEQ PO TBCR: 10.0000 meq | EXTENDED_RELEASE_TABLET | ORAL | 3 refills | Status: DC

## 2020-06-21 MED ORDER — FUROSEMIDE 20 MG PO TABS: 20.0000 mg | ORAL_TABLET | ORAL | 3 refills | Status: DC

## 2020-06-21 NOTE — Telephone Encounter (Signed)
Please change lasix to 20 mg QOD Kdur 10 meq QOD  Check bmp in 2-3 weeks  Thanks Abbe Amsterdam

## 2020-06-21 NOTE — Telephone Encounter (Signed)
Pt advised on Dr. Elmarie Shiley recommendation. Pt reports she is currently taking the Lasix every other day. She needs a Rx for K+ 10 mEq to be sent in. Aware this should be fine but will forward to Dr. Acie Fredrickson for approval prior to sending Rx... Aware they will further discuss lipid mngt at June OV. Patient verbalized understanding and agreeable to plan.

## 2020-06-21 NOTE — Telephone Encounter (Signed)
Informed patient of results and verbal understanding expressed. Chart updated to reflect QOD dosing. Rx sent to [facility] as requested by pt. Pt scheduled for follow up blood work 5/9

## 2020-06-21 NOTE — Telephone Encounter (Signed)
Change lasix to 20 mg every 3 days. If she is taking a potassium supplement, please change that to every 3 days also We will discuss referral to lipid clinic when she is in the office in June

## 2020-06-25 ENCOUNTER — Telehealth: Payer: Self-pay | Admitting: Cardiovascular Disease

## 2020-06-25 NOTE — Telephone Encounter (Signed)
Pt c/o medication issue:  1. Name of Medication:   furosemide (LASIX) 20 MG tablet  potassium chloride (KLOR-CON) 10 MEQ tablet   2. How are you currently taking this medication (dosage and times per day)?  N/A  3. Are you having a reaction (difficulty breathing--STAT)? N/A  4. What is your medication issue? Lattie Haw is needing to confirm this decrease on both medications to confirm they give the correct amount of tablets for a 90 day supply. Please advise.

## 2020-06-25 NOTE — Telephone Encounter (Signed)
RN returned call to [place] to confim dosing. Dose for lasix and potassium confirmed with Dr.Nahser. Lasix 20mg  every other day and potassium 77mEq every other day. Chart updated to reflect changes.

## 2020-06-26 ENCOUNTER — Telehealth: Payer: Self-pay | Admitting: *Deleted

## 2020-06-26 DIAGNOSIS — K921 Melena: Secondary | ICD-10-CM | POA: Diagnosis not present

## 2020-06-26 DIAGNOSIS — K591 Functional diarrhea: Secondary | ICD-10-CM | POA: Diagnosis not present

## 2020-06-26 NOTE — Telephone Encounter (Signed)
Clinical pharmacist to review Eliquis, patient has a history of permanent atrial fibrillation. 

## 2020-06-26 NOTE — Telephone Encounter (Signed)
   [facility] Pre-operative Risk Assessment    Patient Name: Kayla Hale  DOB: 01-27-1946  MRN: [id number]   HEARTCARE STAFF: - Please ensure there is not already an duplicate clearance open for this procedure. - Under Visit Info/Reason for Call, type in Other and utilize the format Clearance MM/DD/YY or Clearance TBD. Do not use dashes or single digits. - If request is for dental extraction, please clarify the # of teeth to be extracted.  Request for surgical clearance:  1. What type of surgery is being performed? Colonoscopy   2. When is this surgery scheduled? 07/16/2020   3. What type of clearance is required (medical clearance vs. Pharmacy clearance to hold med vs. Both)? Both  4. Are there any medications that need to be held prior to surgery and how long?Eliquis, starting 07/14/2020   5. Practice name and name of physician performing surgery? [facility]   6. What is the office phone number? [phone]   7.   What is the office fax number? [phone]  8.   Anesthesia type (None, local, MAC, general) ? Not listed   [name] 06/26/2020, 5:23 PM  _________________________________________________________________   (provider comments below)

## 2020-06-27 NOTE — Telephone Encounter (Signed)
Left message to call back for 2022 at 8:30 AM.  Patient is call back.

## 2020-06-27 NOTE — Telephone Encounter (Signed)
Patient with diagnosis of atrial fibrillation on Eliquis for anticoagulation.    Procedure: colonoscopy Date of procedure: 07/16/20   CHA2DS2-VASc Score = 4  This indicates a 4.8% annual risk of stroke. The patient's score is based upon: CHF History: Yes HTN History: Yes Diabetes History: No Stroke History: No Vascular Disease History: No Age Score: 1 Gender Score: 1   CrCl 96.8 Platelet count 233  Per office protocol, patient can hold Eliquis for 2 days prior to procedure.   Patient will not need bridging with Lovenox (enoxaparin) around procedure.

## 2020-06-28 ENCOUNTER — Telehealth: Payer: Self-pay | Admitting: Cardiovascular Disease

## 2020-06-28 NOTE — Telephone Encounter (Signed)
Pt c/o medication issue:  1. Name of Medication:  potassium chloride (KLOR-CON) 10 MEQ tablet oxybutynin (DITROPAN) 5 MG tablet  2. How are you currently taking this medication (dosage and times per day)? As prescribed  3. Are you having a reaction (difficulty breathing--STAT)? No    4. What is your medication issue? Sharyn Lull from the pharmacy is calling with concerns about both these medications.She also states that both of the medications have a potential interaction.Please advise

## 2020-06-28 NOTE — Telephone Encounter (Signed)
RN returned call to pharmacy regarding medication question. Sharyn Lull, with [place] pharmacy wanted to verify that MD was aware of potential interaction between potassium and oxybutynin. Dr.Nahser aware, no changes at this time.

## 2020-06-28 NOTE — Telephone Encounter (Signed)
Patient returning call.

## 2020-06-28 NOTE — Telephone Encounter (Signed)
New message    Returned call to preop provider.

## 2020-06-28 NOTE — Telephone Encounter (Signed)
Pt is returning [employer].Please advise

## 2020-06-28 NOTE — Telephone Encounter (Signed)
   Primary Cardiologist: Mertie Moores, MD  Chart reviewed as part of pre-operative protocol coverage. Given past medical history and time since last visit, based on ACC/AHA guidelines, Cherokee Boccio would be at acceptable risk for the planned procedure without further cardiovascular testing.   Patient with diagnosis of atrial fibrillation on Eliquis for anticoagulation.    Procedure: colonoscopy Date of procedure: 07/16/20   CHA2DS2-VASc Score = 4  This indicates a 4.8% annual risk of stroke. The patient's score is based upon: CHF History: Yes HTN History: Yes Diabetes History: No Stroke History: No Vascular Disease History: No Age Score: 1 Gender Score: 1   CrCl 96.8 Platelet count 233  Per office protocol, patient can hold Eliquis for 2 days prior to procedure.   Patient will not need bridging with Lovenox (enoxaparin) around procedure.  Patient was advised that if she develops new symptoms prior to surgery to contact our office to arrange a follow-up appointment.  She verbalized understanding.  I will route this recommendation to the requesting party via Epic fax function and remove from pre-op pool.  Please call with questions.  Jossie Ng. Shyla Gayheart NP-C    06/28/2020, 3:42 PM [place] [address] Office [phone] Fax [phone]

## 2020-06-28 NOTE — Telephone Encounter (Signed)
Left message to call back x2 on 06/28/2020 at 2:04 PM.  Patient is contacted

## 2020-07-04 ENCOUNTER — Other Ambulatory Visit: Payer: Self-pay | Admitting: Family

## 2020-07-04 DIAGNOSIS — Z1231 Encounter for screening mammogram for malignant neoplasm of breast: Secondary | ICD-10-CM

## 2020-07-08 ENCOUNTER — Other Ambulatory Visit: Payer: Self-pay

## 2020-07-08 ENCOUNTER — Other Ambulatory Visit: Payer: PPO | Admitting: *Deleted

## 2020-07-08 DIAGNOSIS — Z79899 Other long term (current) drug therapy: Secondary | ICD-10-CM | POA: Diagnosis not present

## 2020-07-08 DIAGNOSIS — I5030 Unspecified diastolic (congestive) heart failure: Secondary | ICD-10-CM

## 2020-07-08 LAB — BASIC METABOLIC PANEL
BUN/Creatinine Ratio: 25 (ref 12–28)
BUN: 20 mg/dL (ref 8–27)
CO2: 23 mmol/L (ref 20–29)
Calcium: 9.4 mg/dL (ref 8.7–10.3)
Chloride: 101 mmol/L (ref 96–106)
Creatinine, Ser: 0.79 mg/dL (ref 0.57–1.00)
Glucose: 108 mg/dL — ABNORMAL HIGH (ref 65–99)
Potassium: 4.4 mmol/L (ref 3.5–5.2)
Sodium: 138 mmol/L (ref 134–144)
eGFR: 78 mL/min/{1.73_m2} (ref >59)

## 2020-07-16 DIAGNOSIS — K644 Residual hemorrhoidal skin tags: Secondary | ICD-10-CM | POA: Diagnosis not present

## 2020-07-16 DIAGNOSIS — Z7901 Long term (current) use of anticoagulants: Secondary | ICD-10-CM | POA: Diagnosis not present

## 2020-07-16 DIAGNOSIS — E039 Hypothyroidism, unspecified: Secondary | ICD-10-CM | POA: Diagnosis not present

## 2020-07-16 DIAGNOSIS — K5730 Diverticulosis of large intestine without perforation or abscess without bleeding: Secondary | ICD-10-CM | POA: Diagnosis not present

## 2020-07-16 DIAGNOSIS — I4891 Unspecified atrial fibrillation: Secondary | ICD-10-CM | POA: Diagnosis not present

## 2020-07-16 DIAGNOSIS — K648 Other hemorrhoids: Secondary | ICD-10-CM | POA: Diagnosis not present

## 2020-07-16 DIAGNOSIS — I10 Essential (primary) hypertension: Secondary | ICD-10-CM | POA: Diagnosis not present

## 2020-07-16 DIAGNOSIS — R195 Other fecal abnormalities: Secondary | ICD-10-CM | POA: Diagnosis not present

## 2020-08-01 ENCOUNTER — Encounter: Payer: Self-pay | Admitting: Cardiovascular Disease

## 2020-08-01 NOTE — Progress Notes (Signed)
Cardiology Office Note   Date:  08/02/2020   ID:  Kayla, Hale 1945-06-13, MRN [id number]  PCP:  Haydee Salter, NP  Cardiologist:   Mertie Moores, MD   Chief Complaint  Patient presents with  . Atrial Fibrillation  . Hypertension   1. Atrial fibrillation - CHADS VASC score = 3 2. Hypertension  Kayla Hale is a 75 yo who is referred for atrial fib. Hx of HTN.  She is basically a symptomatically she was found to have an irregular heart rate on A visit to Urgent care ( which was for a bladder infection) . EKG revealed atrial fibrillation. She does have occasional episodes of palpitations but he seemed to be more related to stress.  Nonsmoker Rare ETOH -  Fhx - father died of MI. ( age 38s), sister has CAd,,   April. 26, 2016: Kayla Hale is a 75 y.o. female who presents for  Atrial fib.  She's done very well. She's been keeping a record of her blood pressure readings and for the most part they are  In the normal  Range. She has not been cardioverted.   She has had atrial fib for many years.    12/24/2014:  She is doing well Is in the donut hole ( mostly from Eliquis )  No CP or dyspnea.  Oct. 31, 2017:  Kayla Hale is seen Today for follow-up of her hypertension and atrial fibrillation. Is not working anymore at [employer] ( previously worked at [employer] )  Exercising - walks every day for 30 minutes.     June 24, 2016:  Kayla Hale is seen for follow up .  Is working - hosting open houses on the weekends. . No CP or dyspnea.   Tries to walk every day for 30 minutes   March 09, 2017:  Doing well.  INR was 2.1 today .   Wants to change to a DOAC ,   She Will call her insurance co. Has gained some weight .  Walking 3 times a week .   Is watching her carbs ( recently )   Jan. 8, 2020  Kayla Hale is seen today .  Has persistent Afib.    Limited by knee pain .   Tries to walk or ride her bike 3 days a week .  No CP or dyspnea.   Jan. 12, 2021;  Kayla Hale is seen today for  follow up of her atrial fib  Feeling well.   Tries to work out 3 times a week  HR remains elevated.   June 23, 2019: Kayla Hale is seen today for follow-up of her atrial fibrillation.  She has been having some leg swelling and presents today for further evaluation. Had some ham for easter.  Also ate hot dogs.  Developed the foot swelling 2 days after that.  Swelling has gone down .  Avoids salt for the most part .  Does not want to start a diuretic because she is on Ditropan   June, 3, 2022: Kayla Hale is seen today for HTN, leg edema Diet has improved.  Still has some leg edema    Past Medical History:  Diagnosis Date  . Atrial fibrillation ([place])   . Chronic kidney disease    STAGE 2  . Diverticulosis 2004  . Dyslipidemia   . Gallstones   . GERD (gastroesophageal reflux disease)   . Heart failure with preserved EF 01/09/2020   Echocardiogram 11/21: EF 50-5, no RWMA, mild reduced RVSF, RVSP 33.8, mod  BAE, trivial MR, mild AI  . Hyperlipidemia   . Hypertension   . Hypothyroidism   . Mild anemia   . Mild obesity   . Nuclear stress test    Myoview 11/21: min apical thinning, no scar or ischemia, EF 42; intermediate risk due to calculated EF (EF looks better than calculated).  . Seasonal allergies   . Shingles 2005   LOWER BACK  . Thyroid disease   . Urinary incontinence     Past Surgical History:  Procedure Laterality Date  . CHOLECYSTECTOMY N/A 03/16/2014   Procedure: LAPAROSCOPIC CHOLECYSTECTOMY WITH INTRAOPERATIVE CHOLANGIOGRAM;  Surgeon: Rolm Bookbinder, MD;  Location: [facility];  Service: General;  Laterality: N/A;  . COLONOSCOPY    . TUBAL LIGATION       Current Outpatient Medications  Medication Sig Dispense Refill  . ALPRAZolam (XANAX) 0.5 MG tablet Take 0.5 mg by mouth daily.    Marland Kitchen apixaban (ELIQUIS) 5 MG TABS tablet Take 1 tablet (5 mg total) by mouth 2 (two) times daily. 180 tablet 1  . Ascorbic Acid (VITAMIN C) 1000 MG tablet Take 1,000 mg by mouth daily.    .  Calcium Carbonate-Vitamin D (CALCIUM-VITAMIN D) 500-200 MG-UNIT per tablet Take 1 tablet by mouth daily.    . cholecalciferol (VITAMIN D) 1000 UNITS tablet Take 1,000 Units by mouth daily.    . colestipol (COLESTID) 1 g tablet Take 1 g by mouth 2 (two) times daily.    . fluticasone (FLONASE) 50 MCG/ACT nasal spray Place 1 spray into both nostrils daily as needed for allergies or rhinitis.     . furosemide (LASIX) 20 MG tablet Take 1 tablet (20 mg total) by mouth every 3 (three) days. 30 tablet 3  . levothyroxine (SYNTHROID, LEVOTHROID) 75 MCG tablet Take 75 mcg by mouth daily before breakfast.    . MAGNESIUM-ZINC PO Take 1 tablet by mouth daily.    . metoprolol tartrate (LOPRESSOR) 50 MG tablet Take 1 tablet (50 mg total) by mouth 2 (two) times daily. 60 tablet 1  . Misc Natural Products (AIRBORNE ELDERBERRY PO) Take 1 tablet by mouth daily.    . Multiple Vitamin (MULTIVITAMIN) tablet Take 1 tablet by mouth daily.    . Omega-3 Fatty Acids (FISH OIL) 1000 MG CAPS Take 1 capsule by mouth daily.    Marland Kitchen omeprazole (PRILOSEC) 20 MG capsule Take 20 mg by mouth daily.    Marland Kitchen oxybutynin (DITROPAN) 5 MG tablet Take 5 mg by mouth 3 (three) times daily.    . potassium chloride (KLOR-CON) 10 MEQ tablet Take 1 tablet (10 mEq total) by mouth every 3 (three) days. 30 tablet 3  . UNABLE TO FIND cbd oil qhs     No current facility-administered medications for this visit.    Allergies:   Lisinopril and Norvasc [amlodipine besylate]    Social History:  The patient  reports that she has never smoked. She has never used smokeless tobacco. She reports that she does not drink alcohol and does not use drugs.   Family History:  The patient's family history includes CVA in her brother; Diabetes in her brother and sister; Heart attack in her father and sister; Hypertension in her father.    ROS: As per current history.  Otherwise negative.  Physical Exam: Blood pressure 128/86, pulse 88, height 5\' 6"  (1.676 m),  weight 202 lb 12.8 oz (92 kg), SpO2 95 %.  GEN:  Well nourished, well developed in no acute distress HEENT: Normal NECK: No JVD; No  carotid bruits LYMPHATICS: No lymphadenopathy CARDIAC: irreg. irreg RESPIRATORY:  Clear to auscultation without rales, wheezing or rhonchi  ABDOMEN: Soft, non-tender, non-distended MUSCULOSKELETAL:  Mild edema ; No deformity  SKIN: Warm and dry NEUROLOGIC:  Alert and oriented x 3   EKG:          Recent Labs: 12/20/2019: Hemoglobin 12.1; Platelets 233 01/08/2020: NT-Pro BNP 1,134 07/08/2020: BUN 20; Creatinine, Ser 0.79; Potassium 4.4; Sodium 138    Lipid Panel No results found for: CHOL, TRIG, HDL, CHOLHDL, VLDL, LDLCALC, LDLDIRECT    Wt Readings from Last 3 Encounters:  08/02/20 202 lb 12.8 oz (92 kg)  01/31/20 201 lb (91.2 kg)  01/08/20 205 lb (93 kg)      Other studies Reviewed: Additional studies/ records that were reviewed today include: . Review of the above records demonstrates:    ASSESSMENT AND PLAN:  1. Atrial fibrillation - CHADS VASC score = 3 Cont eliquis.   HR is well controlled.    2. Hypertension - . BP is better.  Cont diet , exercise   3. Hyperlipidemia:    Per her primary MD    The following changes have been made:  no change  Labs/ tests ordered today include:   No orders of the defined types were placed in this encounter.    Disposition:       Signed, Mertie Moores, MD  08/02/2020 3:45 PM    [facility] [address], [place], [place]  [address] Phone: [phone]; Fax: [phone]

## 2020-08-02 ENCOUNTER — Encounter: Payer: Self-pay | Admitting: Cardiovascular Disease

## 2020-08-02 ENCOUNTER — Other Ambulatory Visit: Payer: Self-pay

## 2020-08-02 ENCOUNTER — Ambulatory Visit: Payer: PPO | Admitting: Cardiovascular Disease

## 2020-08-02 VITALS — BP 128/86 | HR 88 | Ht 66.0 in | Wt 202.8 lb

## 2020-08-02 DIAGNOSIS — I482 Chronic atrial fibrillation, unspecified: Secondary | ICD-10-CM | POA: Diagnosis not present

## 2020-08-02 DIAGNOSIS — I5030 Unspecified diastolic (congestive) heart failure: Secondary | ICD-10-CM | POA: Diagnosis not present

## 2020-08-02 NOTE — Patient Instructions (Signed)
Medication Instructions:  Your physician recommends that you continue on your current medications as directed. Please refer to the Current Medication list given to you today.  *If you need a refill on your cardiac medications before your next appointment, please call your pharmacy*   Lab Work: None If you have labs (blood work) drawn today and your tests are completely normal, you will receive your results only by: MyChart Message (if you have MyChart) OR A paper copy in the mail If you have any lab test that is abnormal or we need to change your treatment, we will call you to review the results.   Testing/Procedures: None   Follow-Up: At [facility], you and your health needs are our priority.  As part of our continuing mission to provide you with exceptional heart care, we have created designated Provider Care Teams.  These Care Teams include your primary Cardiologist (physician) and Advanced Practice Providers (APPs -  Physician Assistants and Nurse Practitioners) who all work together to provide you with the care you need, when you need it.  We recommend signing up for the patient portal called "MyChart".  Sign up information is provided on this After Visit Summary.  MyChart is used to connect with patients for Virtual Visits (Telemedicine).  Patients are able to view lab/test results, encounter notes, upcoming appointments, etc.  Non-urgent messages can be sent to your provider as well.   To learn more about what you can do with MyChart, go to [email or web address].    Your next appointment:   1 year(s)  The format for your next appointment:   In Person  Provider:   You will see one of the following Advanced Practice Providers on your designated Care Team:   Scott Weaver, PA-C Vin Bhagat, PA-C   Other Instructions   

## 2020-08-27 ENCOUNTER — Ambulatory Visit: Payer: PPO

## 2020-09-17 DIAGNOSIS — E7800 Pure hypercholesterolemia, unspecified: Secondary | ICD-10-CM | POA: Diagnosis not present

## 2020-09-17 DIAGNOSIS — Z79899 Other long term (current) drug therapy: Secondary | ICD-10-CM | POA: Diagnosis not present

## 2020-09-17 DIAGNOSIS — E039 Hypothyroidism, unspecified: Secondary | ICD-10-CM | POA: Diagnosis not present

## 2020-09-17 DIAGNOSIS — I10 Essential (primary) hypertension: Secondary | ICD-10-CM | POA: Diagnosis not present

## 2020-10-15 ENCOUNTER — Ambulatory Visit
Admission: RE | Admit: 2020-10-15 | Discharge: 2020-10-15 | Disposition: A | Discharge status: Home / Self Care | Payer: PPO | Source: Ambulatory Visit | Attending: Family | Admitting: Family

## 2020-10-15 ENCOUNTER — Other Ambulatory Visit: Payer: Self-pay

## 2020-10-15 DIAGNOSIS — Z1231 Encounter for screening mammogram for malignant neoplasm of breast: Secondary | ICD-10-CM

## 2020-10-22 DIAGNOSIS — M170 Bilateral primary osteoarthritis of knee: Secondary | ICD-10-CM | POA: Diagnosis not present

## 2020-12-23 ENCOUNTER — Telehealth: Payer: Self-pay | Admitting: Cardiovascular Disease

## 2020-12-23 NOTE — Telephone Encounter (Signed)
*  STAT* If patient is at the pharmacy, call can be transferred to refill team.   1. Which medications need to be refilled? (please list name of each medication and dose if known)   2. Which pharmacy/location (including street and city if local pharmacy) is medication to be sent to? [place], [place]  3. Do they need a 30 day or 90 day supply? 30 day  Patient requesting refill go to her local pharmacy, because she is out of medication.

## 2020-12-23 NOTE — Telephone Encounter (Signed)
Note accidentally routed prematurely. Refill is for apixaban (ELIQUIS) 5 MG TABS tablet

## 2020-12-24 ENCOUNTER — Other Ambulatory Visit: Payer: Self-pay | Admitting: *Deleted

## 2020-12-24 ENCOUNTER — Other Ambulatory Visit: Payer: Self-pay

## 2020-12-24 MED ORDER — APIXABAN 5 MG PO TABS: 5.0000 mg | ORAL_TABLET | Freq: Two times a day (BID) | ORAL | 1 refills | Status: DC

## 2020-12-24 MED ORDER — APIXABAN 5 MG PO TABS: 5.0000 mg | ORAL_TABLET | Freq: Two times a day (BID) | ORAL | 5 refills | Status: DC

## 2020-12-24 NOTE — Telephone Encounter (Signed)
Both Eliquis 5mg  mail order and retail pharmacy request sent today for the pt. Pt requesting local and noted local was sent for 15 day supply (30 tabs and 5 refills). Called the local pharmacy & asked to void the previous Eliquis rx of 30 tabs with 1 refill sent by Lenna Sciara, Pharmacist. Gave a verbal for Eliquis 5mg  60 tabs & 5 refills. They will process this verbal refill order.

## 2020-12-24 NOTE — Telephone Encounter (Signed)
Prescription refill request for Eliquis received. Indication:afib Last office visit:08/02/20 Scr:0.79 Age: 75 Weight:92

## 2020-12-24 NOTE — Telephone Encounter (Signed)
Eliquis 5mg  paper refill request received for Mail Order. Patient is 75 years old, weight-92kg, Crea-0.79 on 07/08/2020, Diagnosis-Afib, and last seen by Dr. Acie Fredrickson on 08/02/2020. Dose is appropriate based on dosing criteria. Will send in refill to requested pharmacy.

## 2021-01-01 NOTE — Telephone Encounter (Signed)
**Note De-Identified Kayla Hale Obfuscation** The pt states that she has fallen into her donut hole and cannot afford her Eliquis. She states that she normally gets a refill for a 90 day supply but could only afford to pick up a 30 day supply this time.  We discussed pt asst for Eliquis through [employer] and she is calling them to ask questions about their Eliquis program and her eligibility to be approved for the program.  She is aware to request that they mail her an application to her home if it appears she is eligible for approval.  I advised her that once she receives the application to complete her part of it, obtain required documents per [employer], and to bring all to Dr Elmarie Shiley office at [facility] to drop off in the front office and that we will take care of the providers page of her application and will fax all to [employer].  She thanked me for calling her back and is aware to call Jeani Hawking at Dr Elmarie Shiley office at [phone] if she needs assistance completing her application or has any questions.

## 2021-01-01 NOTE — Telephone Encounter (Signed)
Patient calling the office for samples of medication:   1.  What medication and dosage are you requesting samples for? apixaban (ELIQUIS) 5 MG TABS tablet  2.  Are you currently out of this medication? Yes    

## 2021-01-01 NOTE — Telephone Encounter (Signed)
Sample request for Eliquis received. Indication: afib Last office visit:08/02/20 Scr: 0.79 Age: 75 Weight: 92kg  Pt on appropriate dose.

## 2021-01-13 ENCOUNTER — Other Ambulatory Visit: Payer: Self-pay | Admitting: *Deleted

## 2021-01-13 MED ORDER — METOPROLOL TARTRATE 50 MG PO TABS: 50.0000 mg | ORAL_TABLET | Freq: Two times a day (BID) | ORAL | 1 refills | Status: DC

## 2021-01-14 NOTE — Telephone Encounter (Signed)
  Pt is calling back, she would like to speak with Jeani Hawking  regarding her pt assistance paper work

## 2021-01-14 NOTE — Telephone Encounter (Signed)
**Note De-Identified  [place] Obfuscation** The pt states that she has completed her [employer] application for Eliquis and has her required documents as well but wants to know what to do with it now. I advised her to bring all to Dr Elmarie Shiley office at [facility] on [address] in [place] o drop off and that we will take care of the provider's page and will fax all to [employer]. She verbalized understanding and thanked me for calling her back.

## 2021-01-29 ENCOUNTER — Telehealth: Payer: Self-pay | Admitting: Cardiovascular Disease

## 2021-01-29 NOTE — Telephone Encounter (Signed)
Patient calling the office for samples of medication:   1.  What medication and dosage are you requesting samples for? apixaban (ELIQUIS) 5 MG TABS tablet  2.  Are you currently out of this medication? yes   She states she is working on [employer].

## 2021-01-29 NOTE — Telephone Encounter (Signed)
Ok to give 2 weeks of samples while she's working on pt assistance application. She is on correct dose of Eliquis.

## 2021-01-30 NOTE — Telephone Encounter (Signed)
Patient called back for update, explain that samples will be left at the front desk for her

## 2021-01-30 NOTE — Telephone Encounter (Signed)
Leaving pt 2 boxes of Eliquis 5 mg tablets at the front desk for pt to pick up.  Lot# [id number]    Exp: 12/2022. Megan,RPH, does pt know to pick them up? Thanks

## 2021-02-25 DIAGNOSIS — Z20828 Contact with and (suspected) exposure to other viral communicable diseases: Secondary | ICD-10-CM | POA: Diagnosis not present

## 2021-02-25 DIAGNOSIS — J209 Acute bronchitis, unspecified: Secondary | ICD-10-CM | POA: Diagnosis not present

## 2021-02-25 DIAGNOSIS — J019 Acute sinusitis, unspecified: Secondary | ICD-10-CM | POA: Diagnosis not present

## 2021-02-25 DIAGNOSIS — M791 Myalgia, unspecified site: Secondary | ICD-10-CM | POA: Diagnosis not present

## 2021-03-11 ENCOUNTER — Other Ambulatory Visit: Payer: Self-pay

## 2021-03-11 ENCOUNTER — Telehealth: Payer: Self-pay | Admitting: Cardiovascular Disease

## 2021-03-11 DIAGNOSIS — Z131 Encounter for screening for diabetes mellitus: Secondary | ICD-10-CM | POA: Diagnosis not present

## 2021-03-11 DIAGNOSIS — D696 Thrombocytopenia, unspecified: Secondary | ICD-10-CM | POA: Diagnosis not present

## 2021-03-11 DIAGNOSIS — E7800 Pure hypercholesterolemia, unspecified: Secondary | ICD-10-CM | POA: Diagnosis not present

## 2021-03-11 DIAGNOSIS — E039 Hypothyroidism, unspecified: Secondary | ICD-10-CM | POA: Diagnosis not present

## 2021-03-11 DIAGNOSIS — Z0001 Encounter for general adult medical examination with abnormal findings: Secondary | ICD-10-CM | POA: Diagnosis not present

## 2021-03-11 DIAGNOSIS — N3281 Overactive bladder: Secondary | ICD-10-CM | POA: Diagnosis not present

## 2021-03-11 DIAGNOSIS — I10 Essential (primary) hypertension: Secondary | ICD-10-CM | POA: Diagnosis not present

## 2021-03-11 MED ORDER — APIXABAN 5 MG PO TABS: 5.0000 mg | ORAL_TABLET | Freq: Two times a day (BID) | ORAL | 1 refills | Status: DC

## 2021-03-11 NOTE — Telephone Encounter (Deleted)
**Note De-Identified Kayla Hale Obfuscation** The pt stated she was in a hurry and did not have much time to speak with me. She states that [employer] is mailing her Eliquis but that it will not arrive until Thursday.  She Is aware that we are leaving her 1 bottle of Eliquis 5 mg samples in the front office for her to pick up.  She also states that she has her [employer] application for Eliquis ready as well but just has not had the time to drop it off. She thanked me for our assistance.

## 2021-03-11 NOTE — Telephone Encounter (Signed)
Pt calling leaving message, requesting a refill on Eliquis and that she needed to change her pharmacy because her insurance has changed. Pt would like a call back concerning this matter, pt did not leave name of the new pharmacy. Please address

## 2021-03-11 NOTE — Telephone Encounter (Signed)
Pt called back, pharmacy updated. Please address

## 2021-03-11 NOTE — Telephone Encounter (Signed)
Patient calling the office for samples of medication:   1.  What medication and dosage are you requesting samples for? apixaban (ELIQUIS) 5 MG TABS tablet  2.  Are you currently out of this medication? No    

## 2021-03-11 NOTE — Telephone Encounter (Signed)
**Note De-Identified Kennidy Lamke Obfuscation** The pt stated she was in a hurry and did not have much time to speak with me. She states that [employer] is mailing her Eliquis but that it will not arrive until Thursday.   She Is aware that we are leaving her 1 bottle of Eliquis 5 mg samples in the front office for her to pick up.   She also states that she has her [employer] application for Eliquis ready as well but just has not had the time to drop it off. She thanked me for our assistance.

## 2021-03-11 NOTE — Telephone Encounter (Signed)
Leaving pt 1 box of Eliquis 5 mg tablets at the front desk for pt to pick up. LOT# [id number]   Exp: 8/24  Thanks Jeani Hawking, LPN

## 2021-03-11 NOTE — Telephone Encounter (Signed)
Pt last saw Dr Acie Fredrickson 08/02/20, last labs 07/08/20 Creat 0.79, age 76, weight 92kg, based on specified criteria pt is on appropriate dosage of Eliquis 5mg  BID for afib.  Will refill rx.  Attempted to call pt back, had to leave message advised to call back with name of new pharmacy so Eliquis rx could be sent in.

## 2021-05-12 ENCOUNTER — Other Ambulatory Visit: Payer: Self-pay

## 2021-05-12 MED ORDER — METOPROLOL TARTRATE 50 MG PO TABS: 50.0000 mg | ORAL_TABLET | Freq: Two times a day (BID) | ORAL | 0 refills | Status: DC

## 2021-05-12 NOTE — Telephone Encounter (Signed)
Pt's medication was sent to pt's pharmacy as requested. Confirmation received.  °

## 2021-06-03 DIAGNOSIS — W540XXA Bitten by dog, initial encounter: Secondary | ICD-10-CM | POA: Diagnosis not present

## 2021-06-03 DIAGNOSIS — L03116 Cellulitis of left lower limb: Secondary | ICD-10-CM | POA: Diagnosis not present

## 2021-06-11 DIAGNOSIS — L03116 Cellulitis of left lower limb: Secondary | ICD-10-CM | POA: Diagnosis not present

## 2021-06-11 DIAGNOSIS — W540XXD Bitten by dog, subsequent encounter: Secondary | ICD-10-CM | POA: Diagnosis not present

## 2021-06-11 DIAGNOSIS — S81802D Unspecified open wound, left lower leg, subsequent encounter: Secondary | ICD-10-CM | POA: Diagnosis not present

## 2021-06-17 DIAGNOSIS — S81852A Open bite, left lower leg, initial encounter: Secondary | ICD-10-CM | POA: Diagnosis not present

## 2021-06-18 ENCOUNTER — Telehealth: Payer: Self-pay | Admitting: Cardiovascular Disease

## 2021-06-18 NOTE — Telephone Encounter (Signed)
Spoke with the patient who states that she is not able to afford her Eliquis anymore. She has not finished her patient assistance application which she started several months ago. I have advised her to go ahead and finish that up so we can get it processed and hopefully approved soon. Patient verbalized understanding.  ?

## 2021-06-18 NOTE — Telephone Encounter (Signed)
Pt c/o medication issue: ? ?1. Name of Medication: apixaban (ELIQUIS) 5 MG TABS tablet ? ?2. How are you currently taking this medication (dosage and times per day)? As prescribed  ? ?3. Are you having a reaction (difficulty breathing--STAT)? No  ? ?4. What is your medication issue? Patient is wanting to discuss alternative meds due to stating she can no longer afford this and is going to have to stop taking it.   ?

## 2021-06-23 DIAGNOSIS — S81852A Open bite, left lower leg, initial encounter: Secondary | ICD-10-CM | POA: Diagnosis not present

## 2021-06-30 DIAGNOSIS — K219 Gastro-esophageal reflux disease without esophagitis: Secondary | ICD-10-CM | POA: Diagnosis not present

## 2021-06-30 DIAGNOSIS — S81852A Open bite, left lower leg, initial encounter: Secondary | ICD-10-CM | POA: Diagnosis not present

## 2021-06-30 DIAGNOSIS — E039 Hypothyroidism, unspecified: Secondary | ICD-10-CM | POA: Diagnosis not present

## 2021-06-30 DIAGNOSIS — I10 Essential (primary) hypertension: Secondary | ICD-10-CM | POA: Diagnosis not present

## 2021-06-30 DIAGNOSIS — D696 Thrombocytopenia, unspecified: Secondary | ICD-10-CM | POA: Diagnosis not present

## 2021-06-30 DIAGNOSIS — K529 Noninfective gastroenteritis and colitis, unspecified: Secondary | ICD-10-CM | POA: Diagnosis not present

## 2021-07-02 ENCOUNTER — Telehealth: Payer: Self-pay | Admitting: Cardiovascular Disease

## 2021-07-02 NOTE — Telephone Encounter (Signed)
Returned call to patient who states that she has completed her ELIQUIS patient assistance forms and wanted to know when she could drop them off for [employer]. I advised her that any day will work and I will scan copies and send the information via email to [place] as she doesn't work in the office. Pt appreciative. ?

## 2021-07-02 NOTE — Telephone Encounter (Signed)
Pt would like for nurse to give her a call back regarding paperwork for medication that needs to be dropped off. Please advise ?

## 2021-07-07 DIAGNOSIS — S81852A Open bite, left lower leg, initial encounter: Secondary | ICD-10-CM | POA: Diagnosis not present

## 2021-07-07 NOTE — Telephone Encounter (Signed)
Received completed paperwork from pt for Eliquis Pt Asst.  ?I have completed the provider page and placed in Dr Lanny Hurst box for signature. Fax cover sheet attached.  ?

## 2021-07-11 ENCOUNTER — Telehealth: Payer: Self-pay

## 2021-07-11 MED ORDER — POTASSIUM CHLORIDE ER 10 MEQ PO TBCR: EXTENDED_RELEASE_TABLET | ORAL | 3 refills | Status: DC

## 2021-07-11 NOTE — Telephone Encounter (Signed)
Reached out to patient to inform her that I've sent her patient assistance forms to [employer] for her Eliquis. Pt also stated that she is almost out of her potassium prescription that she takes along with her furosemide. Sent rx in at this time. ?

## 2021-07-12 ENCOUNTER — Other Ambulatory Visit: Payer: Self-pay | Admitting: Cardiovascular Disease

## 2021-07-14 DIAGNOSIS — S81852A Open bite, left lower leg, initial encounter: Secondary | ICD-10-CM | POA: Diagnosis not present

## 2021-07-15 NOTE — Telephone Encounter (Signed)
**Note De-Identified Harold Moncus Obfuscation** Letter received from [facility] stating that they have denied the pt asst with Eliquis at this time. ?Reason for denial: Documentation of 3% out of pocket RX expenses, based on household adjusted gross income, not met. ?The letter states that they have notified the pt of this denial as well. ?

## 2021-07-21 DIAGNOSIS — S81852A Open bite, left lower leg, initial encounter: Secondary | ICD-10-CM | POA: Diagnosis not present

## 2021-07-21 NOTE — Telephone Encounter (Signed)
   Pt is calling, she received the letter from [facility] she is denied for her pt assistance, she requesting to speak with Jeani Hawking to know what will be the next step if she need to go back warfarin. Also, she is asking if there's available samples she can get. She said, she has bible study till 40 p today, she said, Jeani Hawking or the nurse can call her back tomorrow

## 2021-07-22 ENCOUNTER — Telehealth: Payer: Self-pay | Admitting: Cardiovascular Disease

## 2021-07-22 NOTE — Telephone Encounter (Signed)
**Note De-Identified Kayla Hale Obfuscation** The pt states that [employer] denied her Eliquis asst because her 3% out of pocket RX expense requirement has not been met. She states that the letter does not mention what her amount is.  I advised the pt to call [employer] to ask what her 3% out of pocket RX cost is and to then call [facility] mail pharmacy to ask how much out of pocket she has paid for her prescriptions so far this year and to request that they mail her a copy (receipt).  If she is close to meeting her 3% out of pocket cost she states that she will pay for enough Eliquis at a local pharmacy to meet her out of pocket RX expense requirement.  She is aware to call me back on Thursday 5/25 to let me know if she wants to continue with pt asst through [facility] for her Eliquis or if she should go back on Warfarin.  She reports that she currently has almost enough Eliquis on hand to last a month.  She thanked me for calling her back.

## 2021-07-22 NOTE — Telephone Encounter (Signed)
Pt calling to let nurse know that she spoke with [facility] Harps regarding Eliquis and they approved her over the telephone. Please advise

## 2021-07-22 NOTE — Telephone Encounter (Signed)
Patient got denied for her Eliquis medication. Calling to see what can be done. Please advise

## 2021-07-22 NOTE — Telephone Encounter (Signed)
**Note De-Identified Johnel Yielding Obfuscation** No answer. I left a message on the pts VM asking her to call Jeani Hawking back at [facility] at [phone] concerning her Eliquis/[employer]MSPA[employer] denial. I did advise in the message that if she calls me back after 2 today I will return her call on Thursday 5/25 as I am off tomorrow.

## 2021-07-24 NOTE — Telephone Encounter (Signed)
**Note De-Identified Timberly Yott Obfuscation** The pt states that she has been approved for Eliquis asst though [employer] until 03/01/2022 and just wanted to thank me for my help with her [employer] application and approval. She thanked me for calling her back.

## 2021-07-24 NOTE — Telephone Encounter (Signed)
**Note De-Identified [name] Obfuscation** Letter received Kayla Hale fax from [facility] stating that the pt has been approved for Eliquis assistance until 03/01/2022. Case #: [id number]  The pt is aware of this approval.

## 2021-07-24 NOTE — Telephone Encounter (Signed)
**Note De-Identified Demira Gwynne Obfuscation** Letter received Kayla Hale fax from [facility] stating that the pt has been approved for Eliquis assistance until 03/01/2022. Case #: [id number]  The pt is aware of this approval.

## 2021-08-04 ENCOUNTER — Ambulatory Visit: Payer: PPO | Admitting: Physician Assistant

## 2021-08-04 ENCOUNTER — Other Ambulatory Visit: Payer: Self-pay | Admitting: Cardiovascular Disease

## 2021-08-07 ENCOUNTER — Other Ambulatory Visit: Payer: Self-pay | Admitting: Cardiovascular Disease

## 2021-08-11 DIAGNOSIS — H25812 Combined forms of age-related cataract, left eye: Secondary | ICD-10-CM | POA: Diagnosis not present

## 2021-08-11 DIAGNOSIS — H25811 Combined forms of age-related cataract, right eye: Secondary | ICD-10-CM | POA: Diagnosis not present

## 2021-08-22 DIAGNOSIS — Z01818 Encounter for other preprocedural examination: Secondary | ICD-10-CM | POA: Diagnosis not present

## 2021-08-22 DIAGNOSIS — H25811 Combined forms of age-related cataract, right eye: Secondary | ICD-10-CM | POA: Diagnosis not present

## 2021-08-22 DIAGNOSIS — H25812 Combined forms of age-related cataract, left eye: Secondary | ICD-10-CM | POA: Diagnosis not present

## 2021-08-31 DIAGNOSIS — I351 Nonrheumatic aortic (valve) insufficiency: Secondary | ICD-10-CM

## 2021-08-31 HISTORY — DX: Nonrheumatic aortic (valve) insufficiency: I35.1

## 2021-08-31 NOTE — Progress Notes (Signed)
Cardiology Office Note:    Date:  09/01/2021   ID:  Kayla Hale, DOB [birth year], MRN [id number]  PCP:  Haydee Salter, NP  [place] Prov[facility] Cardiologist:  Mertie Moores, MD    Referring MD: Haydee Salter, NP   Chief Complaint:  F/u for CHF, AFib    Patient Profile: Permanent atrial fibrillation  Heart failure with preserved ejection fraction  Echocardiogram 11/21: EF 50-55, RVSP 33.8, mild reduced RVSF Mild aortic insufficiency (echocardiogram 10/21) Hypertension  Fhx of CAD  Myoview 10/21: no ischemia or scar  Hyperlipidemia  GERD  Hypothyroidism   Prior CV Studies: Echocardiogram 01/08/20 EF 50-55, no RWMA, mild reduced RVSF, RVSP 33.8, mod BAE, trivial MR, mild AI   Myoview 01/08/20 EF 42, no ischemia or scar, intermediate risk due to low EF   Echocardiogram 12/06/13 Mild LVH, EF 55-60, no RWMA, mild AI, mild MR, severe LAE, mild RAE, mild TR, PASP 20   History of Present Illness:   Kayla Hale is a 76 y.o. female with the above problem list.  She was last seen by Dr. Acie Fredrickson in 07/2020.  She returns for f/u.  She is here alone.  Over the past several months, she notes increasing shortness of breath.  She had to stop pulling her trash out to the curb.  She has not had chest pain, orthopnea, PND.  She has chronic leg edema.  This is unchanged.  Her weights are stable.  She does note improved symptoms on the dates that she does take Lasix.  She has not had syncope, chest pain.  She does note a dog bite on her left leg some months ago.  She went to the wound center and her wound is almost completely healed.        Past Medical History:  Diagnosis Date   Atrial fibrillation ([place])    Chronic kidney disease    STAGE 2   Diverticulosis 2004   Dyslipidemia    Gallstones    GERD (gastroesophageal reflux disease)    Heart failure with preserved EF 01/09/2020   Echocardiogram 11/21: EF 50-5, no RWMA, mild reduced RVSF, RVSP 33.8, mod BAE, trivial MR, mild AI    Hyperlipidemia    Hypertension    Hypothyroidism    Mild anemia    Mild obesity    Nuclear stress test    Myoview 11/21: min apical thinning, no scar or ischemia, EF 42; intermediate risk due to calculated EF (EF looks better than calculated).   Seasonal allergies    Shingles 2005   LOWER BACK   Thyroid disease    Urinary incontinence    Current Medications: Current Meds  Medication Sig   ALPRAZolam (XANAX) 0.5 MG tablet Take 0.5 mg by mouth daily.   apixaban (ELIQUIS) 5 MG TABS tablet Take 1 tablet (5 mg total) by mouth 2 (two) times daily.   Ascorbic Acid (VITAMIN C) 1000 MG tablet Take 1,000 mg by mouth daily.   Calcium Carbonate-Vitamin D (CALCIUM-VITAMIN D) 500-200 MG-UNIT per tablet Take 1 tablet by mouth daily.   cholecalciferol (VITAMIN D) 1000 UNITS tablet Take 1,000 Units by mouth daily.   colestipol (COLESTID) 1 g tablet Take 1 g by mouth 2 (two) times daily.   fluticasone (FLONASE) 50 MCG/ACT nasal spray Place 1 spray into both nostrils daily as needed for allergies or rhinitis.    furosemide (LASIX) 20 MG tablet Take 1 tablet by mouth Monday - Friday.. DO NOT TAKE ON [place]  levothyroxine (SYNTHROID, LEVOTHROID) 75 MCG tablet Take 75 mcg by mouth daily before breakfast.   MAGNESIUM-ZINC PO Take 1 tablet by mouth daily.   metoprolol tartrate (LOPRESSOR) 50 MG tablet Take 1 tablet by mouth twice daily. Please keep appt for future refills [phone]. Thanks   Misc Natural Products (AIRBORNE ELDERBERRY PO) Take 1 tablet by mouth daily.   Multiple Vitamin (MULTIVITAMIN) tablet Take 1 tablet by mouth daily.   Omega-3 Fatty Acids (FISH OIL) 1000 MG CAPS Take 1 capsule by mouth daily.   omeprazole (PRILOSEC) 20 MG capsule Take 20 mg by mouth daily.   oxybutynin (DITROPAN) 5 MG tablet Take 5 mg by mouth 3 (three) times daily.   potassium chloride (KLOR-CON) 10 MEQ tablet Take 1 tablet by mouth Monday-Friday, DO NOT TAKE ON SATURDAYS & SUNDAY   UNABLE TO FIND  cbd oil qhs   [DISCONTINUED] furosemide (LASIX) 20 MG tablet Take 1 tablet (20 mg total) by mouth every 3 (three) days.   [DISCONTINUED] potassium chloride (KLOR-CON) 10 MEQ tablet Take 1 tablet by mouth three days per week (same day as furosemide)    Allergies:   Lisinopril and Norvasc [amlodipine besylate]   Social History   Tobacco Use   Smoking status: Never   Smokeless tobacco: Never  Vaping Use   Vaping Use: Never used  Substance Use Topics   Alcohol use: No   Drug use: No    Family Hx: The patient's family history includes CVA in her brother; Diabetes in her brother and sister; Heart attack in her father and sister; Hypertension in her father.  Review of Systems  Gastrointestinal:  Negative for hematochezia and melena.  Genitourinary:  Negative for hematuria.     EKGs/Labs/Other Test Reviewed:    EKG:  EKG is   ordered today.  The ekg ordered today demonstrates atrial fibrillation, HR 86, normal axis, no ST-T wave changes, QTc 433  Recent Labs: No results found for requested labs within last 365 days.   Recent Lipid Panel No results for input(s): "CHOL", "TRIG", "HDL", "VLDL", "LDLCALC", "LDLDIRECT" in the last 8760 hours.   Risk Assessment/Calculations/Metrics:    CHA2DS2-VASc Score = 5   This indicates a 7.2% annual risk of stroke. The patient's score is based upon: CHF History: 1 HTN History: 1 Diabetes History: 0 Stroke History: 0 Vascular Disease History: 0 Age Score: 2 Gender Score: 1             Physical Exam:    VS:  BP 128/82   Pulse 86   Ht '5\' 6"'$  (1.676 m)   Wt 204 lb 9.6 oz (92.8 kg)   SpO2 97%   BMI 33.02 kg/m     Wt Readings from Last 3 Encounters:  09/01/21 204 lb 9.6 oz (92.8 kg)  08/02/20 202 lb 12.8 oz (92 kg)  01/31/20 201 lb (91.2 kg)    Constitutional:      Appearance: Healthy appearance. Not in distress.  Neck:     Vascular: No JVR. JVD normal.  Pulmonary:     Effort: Pulmonary effort is normal.     Breath sounds: No  wheezing. No rales.  Cardiovascular:     Normal rate. Irregularly irregular rhythm. Normal S1. Normal S2.      Murmurs: There is no murmur.  Edema:    Peripheral edema present.    Pretibial: bilateral 1+ edema of the pretibial area.    Ankle: bilateral 1+ edema of the ankle. Abdominal:     Palpations: Abdomen  is soft.  Skin:    General: Skin is warm and dry.  Neurological:     Mental Status: Alert and oriented to person, place and time.         ASSESSMENT & PLAN:   (HFpEF) heart failure with preserved ejection fraction ([place]) She notes worsening shortness of breath over the past few months.  She has chronic swelling in her legs and her weights are stable.  She has improved symptoms on the days she takes Lasix.  I have recommended that we update her echocardiogram and change her Lasix to 5 days a week.  I will check her BNP and if it is significantly elevated, plan to increase her to daily dosing. BMET, CBC, BNP Increase Lasix to 20 mg once daily Mon-Fri.  Take K+ with Lasix BMET 1 week Increase Lasix to 20 once daily if BNP significantly elevated Echocardiogram  F/u 6 mos or sooner if Echocardiogram or labs abnormal  Aortic valve regurgitation Mild by echocardiogram in 2021.  Obtain repeat echocardiogram as noted.  Atrial fibrillation ([place]) Rate is well controlled.  She is tolerating anticoagulation well.  Her weight remains above 92 kg and her age is less than 75.  Her creatinine in January was 0.8.  Continue apixaban 5 mg twice daily.  Obtain follow-up BMET, CBC today.  Essential hypertension The patient's blood pressure is controlled on her current regimen.  Continue current therapy.            Dispo:  Return in about 6 months (around 03/04/2022) for Routine Follow Up, w/ Dr. Acie Fredrickson, or Richardson Dopp, PA-C.   Medication Adjustments/Labs and Tests Ordered: Current medicines are reviewed at length with the patient today.  Concerns regarding medicines are outlined above.  Tests  Ordered: Orders Placed This Encounter  Procedures   Basic metabolic panel   CBC   Pro b natriuretic peptide (BNP)   Basic metabolic panel   EKG 01-XBLT   ECHOCARDIOGRAM COMPLETE   Medication Changes: Meds ordered this encounter  Medications   furosemide (LASIX) 20 MG tablet    Sig: Take 1 tablet by mouth Monday - Friday.. DO NOT TAKE ON SATURDAYS & SUNDAYS    Dispense:  60 tablet    Refill:  3   potassium chloride (KLOR-CON) 10 MEQ tablet    Sig: Take 1 tablet by mouth Monday-Friday, DO NOT TAKE ON SATURDAYS & SUNDAY    Dispense:  60 tablet    Refill:  3   Signed, Richardson Dopp, PA-C  09/01/2021 10:01 AM    [facility] [address], [place], [place]  [address] Phone: [phone]; Fax: [phone]

## 2021-09-01 ENCOUNTER — Encounter: Payer: Self-pay | Admitting: Physician Assistant

## 2021-09-01 ENCOUNTER — Ambulatory Visit: Payer: Medicare Other | Admitting: Physician Assistant

## 2021-09-01 VITALS — BP 128/82 | HR 86 | Ht 66.0 in | Wt 204.6 lb

## 2021-09-01 DIAGNOSIS — I5032 Chronic diastolic (congestive) heart failure: Secondary | ICD-10-CM | POA: Diagnosis not present

## 2021-09-01 DIAGNOSIS — I351 Nonrheumatic aortic (valve) insufficiency: Secondary | ICD-10-CM | POA: Diagnosis not present

## 2021-09-01 DIAGNOSIS — I4821 Permanent atrial fibrillation: Secondary | ICD-10-CM | POA: Diagnosis not present

## 2021-09-01 DIAGNOSIS — I10 Essential (primary) hypertension: Secondary | ICD-10-CM

## 2021-09-01 MED ORDER — FUROSEMIDE 20 MG PO TABS: ORAL_TABLET | ORAL | 3 refills | Status: DC

## 2021-09-01 MED ORDER — POTASSIUM CHLORIDE ER 10 MEQ PO TBCR: EXTENDED_RELEASE_TABLET | ORAL | 3 refills | Status: DC

## 2021-09-01 NOTE — Assessment & Plan Note (Signed)
Mild by echocardiogram in 2021.  Obtain repeat echocardiogram as noted.

## 2021-09-01 NOTE — Assessment & Plan Note (Signed)
Rate is well controlled.  She is tolerating anticoagulation well.  Her weight remains above 92 kg and her age is less than 68.  Her creatinine in January was 0.8.  Continue apixaban 5 mg twice daily.  Obtain follow-up BMET, CBC today.

## 2021-09-01 NOTE — Patient Instructions (Signed)
Medication Instructions:  Your physician has recommended you make the following change in your medication:   INCREASE the Lasix and Potassium to Monday - Friday.. DO NOT TAKE ON SATURDAYS OR SUNDAYS  *If you need a refill on your cardiac medications before your next appointment, please call your pharmacy*   Lab Work: TODAY:  BMET, CBC, & PRO BNP  1 WEEK:  BMET  If you have labs (blood work) drawn today and your tests are completely normal, you will receive your results only by: [place] (if you have MyChart) OR A paper copy in the mail If you have any lab test that is abnormal or we need to change your treatment, we will call you to review the results.   Testing/Procedures: Your physician has requested that you have an echocardiogram. Echocardiography is a painless test that uses sound waves to create images of your heart. It provides your doctor with information about the size and shape of your heart and how well your heart's chambers and valves are working. This procedure takes approximately one hour. There are no restrictions for this procedure.    Follow-Up: At [facility], you and your health needs are our priority.  As part of our continuing mission to provide you with exceptional heart care, we have created designated Provider Care Teams.  These Care Teams include your primary Cardiologist (physician) and Advanced Practice Providers (APPs -  Physician Assistants and Nurse Practitioners) who all work together to provide you with the care you need, when you need it.  We recommend signing up for the patient portal called "MyChart".  Sign up information is provided on this After Visit Summary.  MyChart is used to connect with patients for Virtual Visits (Telemedicine).  Patients are able to view lab/test results, encounter notes, upcoming appointments, etc.  Non-urgent messages can be sent to your provider as well.   To learn more about what you can do with MyChart, go to  [email or web address].    Your next appointment:   6 month(s)  The format for your next appointment:   In Person  Provider:   Mertie Moores, MD  or Richardson Dopp, PA-C         Other Instructions   Important Information About Sugar

## 2021-09-01 NOTE — Assessment & Plan Note (Signed)
She notes worsening shortness of breath over the past few months.  She has chronic swelling in her legs and her weights are stable.  She has improved symptoms on the days she takes Lasix.  I have recommended that we update her echocardiogram and change her Lasix to 5 days a week.  I will check her BNP and if it is significantly elevated, plan to increase her to daily dosing.  BMET, CBC, BNP  Increase Lasix to 20 mg once daily Mon-Fri.  Take K+ with Lasix  BMET 1 week  Increase Lasix to 20 once daily if BNP significantly elevated  Echocardiogram   F/u 6 mos or sooner if Echocardiogram or labs abnormal

## 2021-09-01 NOTE — Assessment & Plan Note (Signed)
The patient's blood pressure is controlled on her current regimen.  Continue current therapy.   

## 2021-09-02 LAB — CBC
Hematocrit: 35.3 % (ref 34.0–46.6)
Hemoglobin: 12.4 g/dL (ref 11.1–15.9)
MCH: 32.6 pg (ref 26.6–33.0)
MCHC: 35.1 g/dL (ref 31.5–35.7)
MCV: 93 fL (ref 79–97)
Platelets: 201 10*3/uL (ref 150–450)
RBC: 3.8 x10E6/uL (ref 3.77–5.28)
RDW: 12.2 % (ref 11.7–15.4)
WBC: 8.7 10*3/uL (ref 3.4–10.8)

## 2021-09-02 LAB — BASIC METABOLIC PANEL
BUN/Creatinine Ratio: 22 (ref 12–28)
BUN: 18 mg/dL (ref 8–27)
CO2: 22 mmol/L (ref 20–29)
Calcium: 9.2 mg/dL (ref 8.7–10.3)
Chloride: 104 mmol/L (ref 96–106)
Creatinine, Ser: 0.81 mg/dL (ref 0.57–1.00)
Glucose: 98 mg/dL (ref 70–99)
Potassium: 4.5 mmol/L (ref 3.5–5.2)
Sodium: 141 mmol/L (ref 134–144)
eGFR: 76 mL/min/{1.73_m2} (ref >59)

## 2021-09-02 LAB — PRO B NATRIURETIC PEPTIDE: NT-Pro BNP: 1354 pg/mL — ABNORMAL HIGH (ref 0–738)

## 2021-09-03 ENCOUNTER — Telehealth: Payer: Self-pay | Admitting: *Deleted

## 2021-09-03 DIAGNOSIS — I5032 Chronic diastolic (congestive) heart failure: Secondary | ICD-10-CM

## 2021-09-03 MED ORDER — POTASSIUM CHLORIDE ER 10 MEQ PO TBCR
10.0000 meq | EXTENDED_RELEASE_TABLET | Freq: Every day | ORAL | 3 refills | Status: DC
Start: 2021-09-03 — End: 2022-02-16

## 2021-09-03 MED ORDER — FUROSEMIDE 20 MG PO TABS: 20.0000 mg | ORAL_TABLET | Freq: Every day | ORAL | 3 refills | Status: DC

## 2021-09-03 NOTE — Telephone Encounter (Signed)
-----   Message from Liliane Shi, PA-C sent at 09/02/2021 11:57 AM EDT ----- Creatinine, K+, Hgb normal.  BNP elevated. PLAN:  -Increase Lasix to 20 mg once daily  -Increase K+ to 10 mEq once daily  -F/u 4-6 weeks BMET, BNP in 1 week Richardson Dopp, PA-C    09/02/2021 11:54 AM

## 2021-09-09 ENCOUNTER — Telehealth: Payer: Self-pay | Admitting: Physician Assistant

## 2021-09-09 ENCOUNTER — Telehealth: Payer: Self-pay | Admitting: Cardiovascular Disease

## 2021-09-09 DIAGNOSIS — I10 Essential (primary) hypertension: Secondary | ICD-10-CM

## 2021-09-09 DIAGNOSIS — I351 Nonrheumatic aortic (valve) insufficiency: Secondary | ICD-10-CM

## 2021-09-09 DIAGNOSIS — I4821 Permanent atrial fibrillation: Secondary | ICD-10-CM

## 2021-09-09 DIAGNOSIS — I5032 Chronic diastolic (congestive) heart failure: Secondary | ICD-10-CM

## 2021-09-09 NOTE — Telephone Encounter (Signed)
Patient called stating some had called her today telling her to go to the label in [place] and they gave her a number to the [address] location.  She said when she tries to call it no one answers it.  She is going to come in on Friday to [address] to get her labs done.

## 2021-09-09 NOTE — Telephone Encounter (Signed)
Patient states she is scheduled to have cataract surgery at [facility] on [address] tomorrow and the eye center told her they would need to consult with anesthesia regarding recent changes to diuretics.   Patient reports she was told by eye center the increased dose of diuretics can affect anesthesia and she may not be able to proceed with cataract surgery. They are consulting their anesthesiologist and are to get back to her with an answer.  Patient would like to know if Richardson Dopp sees any issue with having cataract surgery being on increased Lasix.  Will forward to [employer], PA-C to review and advise.

## 2021-09-09 NOTE — Telephone Encounter (Signed)
Patient called and had a few questions to ask Richardson Dopp. Please call back

## 2021-09-09 NOTE — Telephone Encounter (Signed)
Spoke with patient. Advised to hold lasix tomorrow and to obtain Bmet today to check potassium per Margaret Pyle.   She is going to to [facility] for BMET today and will hold lasix tomorrow for procedure.

## 2021-09-09 NOTE — Telephone Encounter (Signed)
I do not see any issue with her taking diuretics and having cataract surgery. She can hold her Lasix tomorrow. We can get a stat BMET here today to make sure her potassium is normal (in case that is the concern). BMET today (run stat) Hold Lasix tomorrow for procedure. Richardson Dopp, PA-C    09/09/2021 1:36 PM

## 2021-09-09 NOTE — Telephone Encounter (Signed)
    Pre-operative Risk Assessment    Patient Name: Kayla Hale  DOB: 05/03/1945 MRN: [id number]      Request for Surgical Clearance    Procedure:   cataract surgery  Date of Surgery:  Clearance 09/10/21                                 Surgeon:  Dr. Sharen Counter  Surgeon's Group or Practice Name:  [facility] Phone number:  [phone] Fax number:  [phone]   Type of Clearance Requested:   - Medical    Type of Anesthesia:   IV sedation    Additional requests/questions:   need clearance if pt is stable to get cataract surgery   Signed, Selinda Orion   09/09/2021, 1:52 PM

## 2021-09-09 NOTE — Telephone Encounter (Signed)
   Patient Name: Casimira Sutphin  DOB: May 26, 1945 MRN: [id number]  Primary Cardiologist: Mertie Moores, MD  Chart reviewed as part of pre-operative protocol coverage. Cataract extractions are recognized in guidelines as low risk surgeries that do not typically require specific preoperative testing or holding of blood thinner therapy. Therefore, given past medical history and time since last visit, based on ACC/AHA guidelines, Megha Agnes would be at acceptable risk for the planned procedure without further cardiovascular testing.   I will route this recommendation to the requesting party via Epic fax function and remove from pre-op pool.  Please call with questions.  Emmaline Life, NP-C    09/09/2021, 2:22 PM [facility] [address] Office [phone] Fax [phone]

## 2021-09-12 ENCOUNTER — Other Ambulatory Visit: Payer: Medicare Other

## 2021-09-12 ENCOUNTER — Ambulatory Visit (HOSPITAL_COMMUNITY): Payer: Medicare Other | Attending: Cardiology

## 2021-09-12 DIAGNOSIS — I351 Nonrheumatic aortic (valve) insufficiency: Secondary | ICD-10-CM

## 2021-09-12 DIAGNOSIS — I5032 Chronic diastolic (congestive) heart failure: Secondary | ICD-10-CM

## 2021-09-12 DIAGNOSIS — I10 Essential (primary) hypertension: Secondary | ICD-10-CM | POA: Diagnosis not present

## 2021-09-12 DIAGNOSIS — I4821 Permanent atrial fibrillation: Secondary | ICD-10-CM

## 2021-09-12 LAB — ECHOCARDIOGRAM COMPLETE
Area-P 1/2: 5.21 cm2
P 1/2 time: 379 msec
S' Lateral: 2.33 cm

## 2021-09-13 LAB — BASIC METABOLIC PANEL
BUN/Creatinine Ratio: 16 (ref 12–28)
BUN: 15 mg/dL (ref 8–27)
CO2: 20 mmol/L (ref 20–29)
Calcium: 10 mg/dL (ref 8.7–10.3)
Chloride: 103 mmol/L (ref 96–106)
Creatinine, Ser: 0.93 mg/dL (ref 0.57–1.00)
Glucose: 102 mg/dL — ABNORMAL HIGH (ref 70–99)
Potassium: 4.4 mmol/L (ref 3.5–5.2)
Sodium: 139 mmol/L (ref 134–144)
eGFR: 64 mL/min/{1.73_m2} (ref >59)

## 2021-09-13 LAB — PRO B NATRIURETIC PEPTIDE: NT-Pro BNP: 1399 pg/mL — ABNORMAL HIGH (ref 0–738)

## 2021-09-13 LAB — SPECIMEN STATUS REPORT

## 2021-09-16 ENCOUNTER — Other Ambulatory Visit (HOSPITAL_COMMUNITY): Payer: Medicare Other

## 2021-09-16 ENCOUNTER — Telehealth: Payer: Self-pay | Admitting: Cardiovascular Disease

## 2021-09-16 ENCOUNTER — Encounter: Payer: Self-pay | Admitting: Physician Assistant

## 2021-09-16 NOTE — Telephone Encounter (Signed)
Called patient with test results. No answer. Left message to call back.  

## 2021-09-16 NOTE — Telephone Encounter (Signed)
Pt notified of echo and lab results. She states that SOB is only with exertion and is not continually. Pt agree to continue lasix 20 mg daily and potassium 10 meg daily. She will notify office of SOB worsens and will follow up as scheduled.

## 2021-09-16 NOTE — Telephone Encounter (Signed)
Patient is returning call to discuss lab results. 

## 2021-10-06 ENCOUNTER — Other Ambulatory Visit: Payer: Self-pay | Admitting: Cardiovascular Disease

## 2021-10-07 NOTE — Progress Notes (Signed)
Cardiology Office Note:    Date:  10/10/2021   ID:  [name], DOB 1945-10-11, MRN [id number]  PCP:  Ernestene Kiel, MD  [facility] Cardiologist:  Mertie Moores, MD    Referring MD: Haydee Salter, NP   Chief Complaint:  Follow-up for CHF    Patient Profile: Permanent atrial fibrillation  Heart failure with preserved ejection fraction  Echocardiogram 11/21: EF 50-55, RVSP 33.8, mild reduced RVSF Echo 08/2021: EF 60-65, RVSP 40.9, BAE, mild to moderate AI Mild aortic insufficiency (echocardiogram 10/21) Mild to moderate AI by echo 08/2021 Hypertension  Fhx of CAD  Myoview 10/21: no ischemia or scar  Hyperlipidemia  GERD  Hypothyroidism   Prior CV Studies: Echocardiogram 09/12/21 EF 60-65, no RWMA, normal RVSF, mildly elevated PASP, RVSP 40.9, severe BAE, trivial MR, mild to mod AI  Echocardiogram 01/08/20 EF 50-55, no RWMA, mild reduced RVSF, RVSP 33.8, mod BAE, trivial MR, mild AI   Myoview 01/08/20 EF 42, no ischemia or scar, intermediate risk due to low EF   Echocardiogram 12/06/13 Mild LVH, EF 55-60, no RWMA, mild AI, mild MR, severe LAE, mild RAE, mild TR, PASP 20  History of Present Illness:   Kayla Hale is a 76 y.o. female with the above problem list.  She was last seen in July 2023. She noted worsening shortness of breath. A NT-Pro BNP was elevated and I increased her furosemide to 20 mg once daily. A f/u echocardiogram showed normal EF, mildly elevated PASP and mild to mod AI. She returns for f/u.  She is here alone.  Overall, she has been doing well.  She still notes shortness of breath with some activities.  She has not had orthopnea, chest pain, syncope.  She has chronic leg edema that is fairly stable.  She does admit to eating several frozen dinners.  She does not eat out much.  She only has a wrist blood pressure cuff at home.  She cannot afford an arm cuff.        Past Medical History:  Diagnosis Date   Aortic valve  regurgitation 08/31/2021   Echocardiogram 08/2021: EF 60-65, no RWMA, normal RVSF, mildly elevated PASP (RVSP 40.9), severe BAE, trivial MR, mild to moderate AI   Atrial fibrillation ([place])    Chronic kidney disease    STAGE 2   Diverticulosis 2004   Dyslipidemia    Gallstones    GERD (gastroesophageal reflux disease)    Heart failure with preserved EF 01/09/2020   Echocardiogram 11/21: EF 50-5, no RWMA, mild reduced RVSF, RVSP 33.8, mod BAE, trivial MR, mild AI   Hyperlipidemia    Hypertension    Hypothyroidism    Mild anemia    Mild obesity    Nuclear stress test    Myoview 11/21: min apical thinning, no scar or ischemia, EF 42; intermediate risk due to calculated EF (EF looks better than calculated).   Seasonal allergies    Shingles 2005   LOWER BACK   Thyroid disease    Urinary incontinence    Current Medications: Current Meds  Medication Sig   ALPRAZolam (XANAX) 0.5 MG tablet Take 0.5 mg by mouth daily.   apixaban (ELIQUIS) 5 MG TABS tablet Take 1 tablet (5 mg total) by mouth 2 (two) times daily.   Ascorbic Acid (VITAMIN C) 1000 MG tablet Take 1,000 mg by mouth daily.   Calcium Carbonate-Vitamin D (CALCIUM-VITAMIN D) 500-200 MG-UNIT per tablet Take 1 tablet by mouth daily.   cholecalciferol (VITAMIN  D) 1000 UNITS tablet Take 1,000 Units by mouth daily.   colestipol (COLESTID) 1 g tablet Take 1 g by mouth 2 (two) times daily.   fluticasone (FLONASE) 50 MCG/ACT nasal spray Place 1 spray into both nostrils daily as needed for allergies or rhinitis.    furosemide (LASIX) 20 MG tablet Take 1 tablet (20 mg total) by mouth daily.   levothyroxine (SYNTHROID, LEVOTHROID) 75 MCG tablet Take 75 mcg by mouth daily before breakfast.   MAGNESIUM-ZINC PO Take 1 tablet by mouth daily.   metoprolol tartrate (LOPRESSOR) 50 MG tablet TAKE 1 TABLET BY MOUTH TWICE  DAILY   Misc Natural Products (AIRBORNE ELDERBERRY PO) Take 1 tablet by mouth daily.   Multiple Vitamin (MULTIVITAMIN) tablet Take 1  tablet by mouth daily.   Omega-3 Fatty Acids (FISH OIL) 1000 MG CAPS Take 1 capsule by mouth daily.   omeprazole (PRILOSEC) 20 MG capsule Take 20 mg by mouth daily.   potassium chloride (KLOR-CON) 10 MEQ tablet Take 1 tablet (10 mEq total) by mouth daily.   prednisoLONE acetate (PRED FORTE) 1 % ophthalmic suspension INSTILL 1 DROP INTO RIGHT EYE 3 TIMES DAILY FOR 3 WEEKS   UNABLE TO FIND cbd oil qhs    Allergies:   Lisinopril and Norvasc [amlodipine besylate]   Social History   Tobacco Use   Smoking status: Never   Smokeless tobacco: Never  Vaping Use   Vaping Use: Never used  Substance Use Topics   Alcohol use: No   Drug use: No    Family Hx: The patient's family history includes CVA in her brother; Diabetes in her brother and sister; Heart attack in her father and sister; Hypertension in her father.  Review of Systems  Gastrointestinal:  Negative for hematochezia.  Genitourinary:  Negative for hematuria.     EKGs/Labs/Other Test Reviewed:    EKG:  EKG is not ordered today.  The ekg ordered today demonstrates n/a  Recent Labs: 09/01/2021: Hemoglobin 12.4; Platelets 201 09/12/2021: BUN 15; Creatinine, Ser 0.93; NT-Pro BNP 1,399; Potassium 4.4; Sodium 139   Recent Lipid Panel No results for input(s): "CHOL", "TRIG", "HDL", "VLDL", "LDLCALC", "LDLDIRECT" in the last 8760 hours.   Risk Assessment/Calculations/Metrics:    CHA2DS2-VASc Score = 5   This indicates a 7.2% annual risk of stroke. The patient's score is based upon: CHF History: 1 HTN History: 1 Diabetes History: 0 Stroke History: 0 Vascular Disease History: 0 Age Score: 2 Gender Score: 1             Physical Exam:    VS:  BP 138/78   Pulse 68   Ht '5\' 6"'$  (1.676 m)   Wt 206 lb 9.6 oz (93.7 kg)   SpO2 94%   BMI 33.35 kg/m     Wt Readings from Last 3 Encounters:  10/10/21 206 lb 9.6 oz (93.7 kg)  09/01/21 204 lb 9.6 oz (92.8 kg)  08/02/20 202 lb 12.8 oz (92 kg)    Constitutional:      Appearance:  Healthy appearance. Not in distress.  Neck:     Vascular: No JVR. JVD normal.  Pulmonary:     Effort: Pulmonary effort is normal.     Breath sounds: No wheezing. No rales.  Cardiovascular:     Normal rate. Irregularly irregular rhythm. Normal S1. Normal S2.      Murmurs: There is no murmur.  Edema:    Peripheral edema present.    Pretibial: bilateral 1+ edema of the pretibial area. Abdominal:  Palpations: Abdomen is soft.  Skin:    General: Skin is warm and dry.  Neurological:     Mental Status: Alert and oriented to person, place and time.         ASSESSMENT & PLAN:   (HFpEF) heart failure with preserved ejection fraction (HCC) EF 60-65 by recent echocardiogram.  NYHA II-IIb.  Volume status fairly stable.  We discussed the benefits of MRA, SGLT2 inhibitor, ARNI.  She is on a limited income and would likely need assistance with SGLT2 inhibitor or Entresto.  We discussed potentially starting on spironolactone and stopping her potassium supplement.  She prefers to avoid changing any medications at this time.  As she is stable, I think that this is reasonable.  She knows to contact us if symptoms change.  Continue furosemide 20 mg daily, potassium 10 mg daily.  Follow-up in 6 months.  Aortic valve regurgitation She will undergo follow-up echocardiography in July 2024.  Permanent atrial fibrillation ([place]) Fair control of heart rate.  Continue metoprolol 50 mg twice daily.  She is tolerating anticoagulation.  Recent hemoglobin and creatinine stable.  Continue Eliquis 5 mg twice daily.  Essential hypertension Fair control of blood pressure.  We discussed reducing her sodium intake.  She does eat a lot of frozen dinners.  As noted, we did consider whether or not to place her on spironolactone but she prefers to hold off for now.  We will provide her with a new blood pressure cuff and she will monitor her blood pressures at home.           Dispo:  Return in about 6 months (around  04/12/2022) for Routine Follow Up, w/ Dr. Acie Fredrickson, or Richardson Dopp, PA-C.   Medication Adjustments/Labs and Tests Ordered: Current medicines are reviewed at length with the patient today.  Concerns regarding medicines are outlined above.  Tests Ordered: No orders of the defined types were placed in this encounter.  Medication Changes: No orders of the defined types were placed in this encounter.  Signed, Richardson Dopp, PA-C  10/10/2021 9:05 AM    [address], [place], [place]  [address] Phone: [phone]; Fax: [phone]

## 2021-10-10 ENCOUNTER — Ambulatory Visit: Payer: Medicare Other | Admitting: Physician Assistant

## 2021-10-10 ENCOUNTER — Encounter: Payer: Self-pay | Admitting: Physician Assistant

## 2021-10-10 VITALS — BP 138/78 | HR 68 | Ht 66.0 in | Wt 206.6 lb

## 2021-10-10 DIAGNOSIS — I351 Nonrheumatic aortic (valve) insufficiency: Secondary | ICD-10-CM | POA: Diagnosis not present

## 2021-10-10 DIAGNOSIS — I5033 Acute on chronic diastolic (congestive) heart failure: Secondary | ICD-10-CM

## 2021-10-10 DIAGNOSIS — I4821 Permanent atrial fibrillation: Secondary | ICD-10-CM

## 2021-10-10 DIAGNOSIS — I10 Essential (primary) hypertension: Secondary | ICD-10-CM

## 2021-10-10 NOTE — Assessment & Plan Note (Signed)
Fair control of heart rate.  Continue metoprolol 50 mg twice daily.  She is tolerating anticoagulation.  Recent hemoglobin and creatinine stable.  Continue Eliquis 5 mg twice daily.

## 2021-10-10 NOTE — Assessment & Plan Note (Signed)
She will undergo follow-up echocardiography in July 2024.

## 2021-10-10 NOTE — Assessment & Plan Note (Signed)
Fair control of blood pressure.  We discussed reducing her sodium intake.  She does eat a lot of frozen dinners.  As noted, we did consider whether or not to place her on spironolactone but she prefers to hold off for now.  We will provide her with a new blood pressure cuff and she will monitor her blood pressures at home.

## 2021-10-10 NOTE — Patient Instructions (Signed)
Medication Instructions:  Your physician recommends that you continue on your current medications as directed. Please refer to the Current Medication list given to you today.  *If you need a refill on your cardiac medications before your next appointment, please call your pharmacy*   Lab Work: NONE If you have labs (blood work) drawn today and your tests are completely normal, you will receive your results only by: [facility] (if you have MyChart) OR A paper copy in the mail If you have any lab test that is abnormal or we need to change your treatment, we will call you to review the results.   Testing/Procedures: NONE   Follow-Up: At [facility], you and your health needs are our priority.  As part of our continuing mission to provide you with exceptional heart care, we have created designated Provider Care Teams.  These Care Teams include your primary Cardiologist (physician) and Advanced Practice Providers (APPs -  Physician Assistants and Nurse Practitioners) who all work together to provide you with the care you need, when you need it.  We recommend signing up for the patient portal called "MyChart".  Sign up information is provided on this After Visit Summary.  MyChart is used to connect with patients for Virtual Visits (Telemedicine).  Patients are able to view lab/test results, encounter notes, upcoming appointments, etc.  Non-urgent messages can be sent to your provider as well.   To learn more about what you can do with MyChart, go to [email or web address].    Your next appointment:   6 month(s)  The format for your next appointment:   In Person  Provider:   Mertie Moores, MD or Richardson Dopp, PA-C  Other Instructions  DASH Eating Plan DASH stands for Dietary Approaches to Stop Hypertension. The DASH eating plan is a healthy eating plan that has been shown to: Reduce high blood pressure (hypertension). Reduce your risk for type 2 diabetes, heart disease, and  stroke. Help with weight loss. What are tips for following this plan? Reading food labels Check food labels for the amount of salt (sodium) per serving. Choose foods with less than 5 percent of the Daily Value of sodium. Generally, foods with less than 300 milligrams (mg) of sodium per serving fit into this eating plan. To find whole grains, look for the word "whole" as the first word in the ingredient list. Shopping Buy products labeled as "low-sodium" or "no salt added." Buy fresh foods. Avoid canned foods and pre-made or frozen meals. Cooking Avoid adding salt when cooking. Use salt-free seasonings or herbs instead of table salt or sea salt. Check with your health care provider or pharmacist before using salt substitutes. Do not fry foods. Cook foods using healthy methods such as baking, boiling, grilling, roasting, and broiling instead. Cook with heart-healthy oils, such as olive, canola, avocado, soybean, or sunflower oil. Meal planning  Eat a balanced diet that includes: 4 or more servings of fruits and 4 or more servings of vegetables each day. Try to fill one-half of your plate with fruits and vegetables. 6-8 servings of whole grains each day. Less than 6 oz (170 g) of lean meat, poultry, or fish each day. A 3-oz (85-g) serving of meat is about the same size as a deck of cards. One egg equals 1 oz (28 g). 2-3 servings of low-fat dairy each day. One serving is 1 cup (237 mL). 1 serving of nuts, seeds, or beans 5 times each week. 2-3 servings of heart-healthy fats. Healthy fats called  omega-3 fatty acids are found in foods such as walnuts, flaxseeds, fortified milks, and eggs. These fats are also found in cold-water fish, such as sardines, salmon, and mackerel. Limit how much you eat of: Canned or prepackaged foods. Food that is high in trans fat, such as some fried foods. Food that is high in saturated fat, such as fatty meat. Desserts and other sweets, sugary drinks, and other foods  with added sugar. Full-fat dairy products. Do not salt foods before eating. Do not eat more than 4 egg yolks a week. Try to eat at least 2 vegetarian meals a week. Eat more home-cooked food and less restaurant, buffet, and fast food. Lifestyle When eating at a restaurant, ask that your food be prepared with less salt or no salt, if possible. If you drink alcohol: Limit how much you use to: 0-1 drink a day for women who are not pregnant. 0-2 drinks a day for men. Be aware of how much alcohol is in your drink. In the [place] one drink equals one 12 oz bottle of beer (355 mL), one 5 oz glass of wine (148 mL), or one 1 oz glass of hard liquor (44 mL). General information Avoid eating more than 2,300 mg of salt a day. If you have hypertension, you may need to reduce your sodium intake to 1,500 mg a day. Work with your health care provider to maintain a healthy body weight or to lose weight. Ask what an ideal weight is for you. Get at least 30 minutes of exercise that causes your heart to beat faster (aerobic exercise) most days of the week. Activities may include walking, swimming, or biking. Work with your health care provider or dietitian to adjust your eating plan to your individual calorie needs. What foods should I eat? Fruits All fresh, dried, or frozen fruit. Canned fruit in natural juice (without added sugar). Vegetables Fresh or frozen vegetables (raw, steamed, roasted, or grilled). Low-sodium or reduced-sodium tomato and vegetable juice. Low-sodium or reduced-sodium tomato sauce and tomato paste. Low-sodium or reduced-sodium canned vegetables. Grains Whole-grain or whole-wheat bread. Whole-grain or whole-wheat pasta. Brown rice. [place]. Bulgur. Whole-grain and low-sodium cereals. Pita bread. Low-fat, low-sodium crackers. Whole-wheat flour tortillas. Meats and other proteins Skinless chicken or [place]. Ground chicken or [place]. Pork with fat trimmed off. Fish and seafood. Egg  whites. Dried beans, peas, or lentils. Unsalted nuts, nut butters, and seeds. Unsalted canned beans. Lean cuts of beef with fat trimmed off. Low-sodium, lean precooked or cured meat, such as sausages or meat loaves. Dairy Low-fat (1%) or fat-free (skim) milk. Reduced-fat, low-fat, or fat-free cheeses. Nonfat, low-sodium ricotta or cottage cheese. Low-fat or nonfat yogurt. Low-fat, low-sodium cheese. Fats and oils Soft margarine without trans fats. Vegetable oil. Reduced-fat, low-fat, or light mayonnaise and salad dressings (reduced-sodium). Canola, safflower, olive, avocado, soybean, and sunflower oils. Avocado. Seasonings and condiments Herbs. Spices. Seasoning mixes without salt. Other foods Unsalted popcorn and pretzels. Fat-free sweets. The items listed above may not be a complete list of foods and beverages you can eat. Contact a dietitian for more information. What foods should I avoid? Fruits Canned fruit in a light or heavy syrup. Fried fruit. Fruit in cream or butter sauce. Vegetables Creamed or fried vegetables. Vegetables in a cheese sauce. Regular canned vegetables (not low-sodium or reduced-sodium). Regular canned tomato sauce and paste (not low-sodium or reduced-sodium). Regular tomato and vegetable juice (not low-sodium or reduced-sodium). Angie Fava. Olives. Grains Baked goods made with fat, such as croissants, muffins, or some  breads. Dry pasta or rice meal packs. Meats and other proteins Fatty cuts of meat. Ribs. Fried meat. Berniece Salines. Bologna, salami, and other precooked or cured meats, such as sausages or meat loaves. Fat from the back of a pig (fatback). Bratwurst. Salted nuts and seeds. Canned beans with added salt. Canned or smoked fish. Whole eggs or egg yolks. Chicken or [place] with skin. Dairy Whole or 2% milk, cream, and half-and-half. Whole or full-fat cream cheese. Whole-fat or sweetened yogurt. Full-fat cheese. Nondairy creamers. Whipped toppings. Processed cheese and  cheese spreads. Fats and oils Butter. Stick margarine. Lard. Shortening. Ghee. Bacon fat. Tropical oils, such as coconut, palm kernel, or palm oil. Seasonings and condiments Onion salt, garlic salt, seasoned salt, table salt, and sea salt. Worcestershire sauce. Tartar sauce. Barbecue sauce. Teriyaki sauce. Soy sauce, including reduced-sodium. Steak sauce. Canned and packaged gravies. Fish sauce. Oyster sauce. Cocktail sauce. Store-bought horseradish. Ketchup. Mustard. Meat flavorings and tenderizers. Bouillon cubes. Hot sauces. Pre-made or packaged marinades. Pre-made or packaged taco seasonings. Relishes. Regular salad dressings. Other foods Salted popcorn and pretzels. The items listed above may not be a complete list of foods and beverages you should avoid. Contact a dietitian for more information. Where to find more information National Heart, Lung, and Blood Institute: https://wilson-eaton.com/ American Heart Association: www.heart.org Academy of Nutrition and Dietetics: www.eatright.[place]: www.kidney.org Summary The DASH eating plan is a healthy eating plan that has been shown to reduce high blood pressure (hypertension). It may also reduce your risk for type 2 diabetes, heart disease, and stroke. When on the DASH eating plan, aim to eat more fresh fruits and vegetables, whole grains, lean proteins, low-fat dairy, and heart-healthy fats. With the DASH eating plan, you should limit salt (sodium) intake to 2,300 mg a day. If you have hypertension, you may need to reduce your sodium intake to 1,500 mg a day. Work with your health care provider or dietitian to adjust your eating plan to your individual calorie needs. This information is not intended to replace advice given to you by your health care provider. Make sure you discuss any questions you have with your health care provider. Document Revised: 01/20/2019 Document Reviewed: 01/20/2019 Elsevier Patient Education  [place]

## 2021-10-10 NOTE — Assessment & Plan Note (Signed)
EF 60-65 by recent echocardiogram.  NYHA II-IIb.  Volume status fairly stable.  We discussed the benefits of MRA, SGLT2 inhibitor, ARNI.  She is on a limited income and would likely need assistance with SGLT2 inhibitor or Entresto.  We discussed potentially starting on spironolactone and stopping her potassium supplement.  She prefers to avoid changing any medications at this time.  As she is stable, I think that this is reasonable.  She knows to contact us if symptoms change.  Continue furosemide 20 mg daily, potassium 10 mg daily.  Follow-up in 6 months.

## 2021-11-10 ENCOUNTER — Other Ambulatory Visit: Payer: Self-pay | Admitting: Internal Medicine

## 2021-11-10 DIAGNOSIS — Z1231 Encounter for screening mammogram for malignant neoplasm of breast: Secondary | ICD-10-CM

## 2021-12-09 ENCOUNTER — Ambulatory Visit: Payer: Medicare Other

## 2022-01-27 ENCOUNTER — Telehealth: Payer: Self-pay | Admitting: Physician Assistant

## 2022-01-27 NOTE — Telephone Encounter (Signed)
I did not need this encounter. °

## 2022-01-29 ENCOUNTER — Telehealth: Payer: Self-pay | Admitting: Cardiovascular Disease

## 2022-01-29 DIAGNOSIS — I5033 Acute on chronic diastolic (congestive) heart failure: Secondary | ICD-10-CM

## 2022-01-29 DIAGNOSIS — Z79899 Other long term (current) drug therapy: Secondary | ICD-10-CM

## 2022-01-29 NOTE — Telephone Encounter (Signed)
Called pt in regards to swelling.  Reports swelling to BLE started on Monday 01/26/22.  With more swelling noted to LLE.  Pt reports on Tuesday while out with sister became SOB with ambulation.  Sister became very concerned as pt was having difficulty breathing.  Could feel heart beating funny/ fast.  Has not had any further episodes. Does not wear compression socks and has started elevating legs today.  Has not missed any doses of furosemide or Eliquis.  Feels that UOP is not good.   Drinks 32 oz water daily along with coffee and Dr. Malachi Bonds.  Had increased intake of salt over Thanksgiving holiday.  Advised pt that this more than likely is contributing to swelling.  Does not weigh self daily.  Advised to weigh daily and write it down.  198 lbs 2 weeks ago at PCP OV and 204 lbs today.  Denies pain and redness to lower extremities.  Did note that LLE had a purple place on Tuesday but is no longer there today.  Does not check BP but reports was ok at last OV with PCP.  Advised pt to take an extra dose of furosemide today and call in tomorrow to update on swelling and SOB.  Will send message to [facility] to see if there are any further recommendations.

## 2022-01-29 NOTE — Telephone Encounter (Signed)
Pt c/o swelling: STAT is pt has developed SOB within 24 hours  If swelling, where is the swelling located?  Both feet and ankle, especially on left side  How much weight have you gained and in what time span?   5 lbs  Have you gained 3 pounds in a day or 5 pounds in a week? No  Do you have a log of your daily weights (if so, list)?   No  Are you currently taking a fluid pill? Yes  Are you currently SOB?   Yes, when she walks  Have you traveled recently?  No  Patient stated the swelling started this week.  Patient stated she started getting SOB on Tuesday when she was walking.  Patient stated she had COVID about a month ago.

## 2022-01-30 NOTE — Telephone Encounter (Signed)
  Pt is returning call, she said, she's on her way to the office to dropped off some paperwork and she is asking if she can talk to [facility] there

## 2022-01-30 NOTE — Telephone Encounter (Signed)
Pt is calling back to report that the swelling has not gone down and she would like additional advice as to how to help with the swelling. She states that her SOB is not as bad, but still has a bit of SOB. Please advise.

## 2022-01-30 NOTE — Telephone Encounter (Signed)
Take Lasix 60 mg total today. Tomorrow, continue on Lasix 40 mg once daily for 7 days. Increase K+ to 20 mEq once daily x 7 days. After 7 days, resume Lasix 20 mg once daily and K+ 10 mEq once daily. BMET 1 week. Arrange f/u OV in the next 1-2 weeks. Richardson Dopp, PA-C    01/30/2022 11:19 AM

## 2022-01-30 NOTE — Telephone Encounter (Addendum)
Spoke with patient to review instructions for lasix and K+ per Richardson Dopp, PA.  Patient verbalized understanding of instructions and is scheduled for a BMET on 12/12 and follow appointment on 12/18.

## 2022-01-30 NOTE — Telephone Encounter (Signed)
Left message for return call.

## 2022-02-10 ENCOUNTER — Telehealth: Payer: Self-pay | Admitting: Cardiovascular Disease

## 2022-02-10 ENCOUNTER — Ambulatory Visit: Payer: Medicare Other | Attending: Cardiovascular Disease

## 2022-02-10 DIAGNOSIS — I5033 Acute on chronic diastolic (congestive) heart failure: Secondary | ICD-10-CM

## 2022-02-10 DIAGNOSIS — Z79899 Other long term (current) drug therapy: Secondary | ICD-10-CM

## 2022-02-10 NOTE — Telephone Encounter (Signed)
Call placed to pt to see what type of form it was to be filled out.  Per pt, it was 1 page about her applying to Medicare for Diabetes/Heart plan that had to be filled out by her MD.  I will see if form was handed to Dr. Acie Fredrickson or his RN, Judson Roch and have someone call her back.

## 2022-02-10 NOTE — Telephone Encounter (Signed)
Can we find out what forms need to be completed? Richardson Dopp, PA-C    02/10/2022 3:14 PM

## 2022-02-10 NOTE — Telephone Encounter (Signed)
Patient wants to know if paperwork that they left with the provider/nurse was faxed to [facility]. The patient would like to be contacted via telephone in regards to the whereabouts of the documents.

## 2022-02-11 LAB — BASIC METABOLIC PANEL
BUN/Creatinine Ratio: 18 (ref 12–28)
BUN: 16 mg/dL (ref 8–27)
CO2: 23 mmol/L (ref 20–29)
Calcium: 9.3 mg/dL (ref 8.7–10.3)
Chloride: 103 mmol/L (ref 96–106)
Creatinine, Ser: 0.89 mg/dL (ref 0.57–1.00)
Glucose: 100 mg/dL — ABNORMAL HIGH (ref 70–99)
Potassium: 4.9 mmol/L (ref 3.5–5.2)
Sodium: 139 mmol/L (ref 134–144)
eGFR: 67 mL/min/{1.73_m2} (ref >59)

## 2022-02-13 NOTE — Progress Notes (Unsigned)
Cardiology Office Note:    Date:  02/16/2022   ID:  [name], DOB [birth year], MRN [id number]  PCP:  Ernestene Kiel, [place] [facility] Cardiologist:  Mertie Moores, MD     Referring MD: Ernestene Kiel, MD   Chief Complaint:  F/u for CHF    Patient Profile: Permanent atrial fibrillation  Heart failure with preserved ejection fraction  TTE 11/21: EF 50-55, RVSP 33.8, mild reduced RVSF TTE 09/12/21: EF 60-65, no RWMA, normal RVSF, mildly elevated PASP, RVSP 40.9, severe BAE, trivial MR, mild to mod AI  Mild to mod aortic insufficiency  Hypertension  Fhx of CAD  Myoview 01/08/20: no ischemia or scar  Hyperlipidemia  GERD  Hypothyroidism     History of Present Illness:   Kayla Hale is a 76 y.o. female with the above problem list.  She was last seen 10/10/2021.  She called in recently with increasing leg edema and shortness of breath.  I adjusted her furosemide and arranged follow-up in the office.  She is here alone.  She feels that her lower extremity edema did improve slightly with increasing her dose of Lasix.  She still has more swelling in her left leg than her right.  This is unusual for her.  She had a dog bite several months ago and has continued to have swelling in that extremity.  She is concerned she may have a blood clot.  She has noted dyspnea with exertion at times.  She went to her sisters for her birthday recently and noted significant shortness of breath with just minimal activity.  She also had associated chest pressure.  She gets chest pressure with exertion from time to time.  She does not have any radiating symptoms, associated nausea or diaphoresis.  She has not had syncope, orthopnea, PND.     EKG: Atrial fibrillation, HR 116, normal axis, no ST-T wave changes, QTc 442    Reviewed and updated this encounter:  Tobacco  Allergies  Meds  Problems  Med Hx  Surg Hx  Fam Hx     Review of Systems  Respiratory:  Positive for  cough (since COVID in Oct 23). Negative for sputum production.   Gastrointestinal:  Negative for hematochezia and melena.  Genitourinary:  Negative for hematuria.    Labs/Other Test Reviewed:   Recent Labs: 09/01/2021: Hemoglobin 12.4; Platelets 201 09/12/2021: NT-Pro BNP 1,399 02/10/2022: BUN 16; Creatinine, Ser 0.89; Potassium 4.9; Sodium 139   Recent Lipid Panel No results for input(s): "CHOL", "TRIG", "HDL", "VLDL", "LDLCALC", "LDLDIRECT" in the last 8760 hours.   Risk Assessment/Calculations/Metrics:   CHA2DS2-VASc Score = 5   This indicates a 7.2% annual risk of stroke. The patient's score is based upon: CHF History: 1 HTN History: 1 Diabetes History: 0 Stroke History: 0 Vascular Disease History: 0 Age Score: 2 Gender Score: 1            Physical Exam:   VS:  BP 138/80   Pulse (!) 104   Ht '5\' 6"'$  (1.676 m)   Wt 201 lb 12.8 oz (91.5 kg)   SpO2 98%   BMI 32.57 kg/m    Wt Readings from Last 3 Encounters:  02/16/22 201 lb 12.8 oz (91.5 kg)  10/10/21 206 lb 9.6 oz (93.7 kg)  09/01/21 204 lb 9.6 oz (92.8 kg)    Constitutional:      Appearance: Healthy appearance. Not in distress.  Neck:     Vascular: JVD normal.  Pulmonary:  Effort: Pulmonary effort is normal.     Breath sounds: No wheezing. No rales.  Cardiovascular:     Tachycardia present. Irregularly irregular rhythm. Normal S1. Normal S2.      Murmurs: There is no murmur.  Edema:    Peripheral edema present.    Pretibial: bilateral 1+ edema of the pretibial area.    Comments: L>R Abdominal:     Palpations: There is no hepatomegaly.          ASSESSMENT & PLAN:   (HFpEF) heart failure with preserved ejection fraction ([place]) She recently had increased weight, swelling and increased shortness of breath.  She had slight improvement in her swelling with an increased dose of furosemide.  Her weight when she was seen last in August was 206.  She is down 5 pounds to 201 today.  She brings in a list of her  weights from home.  They have remained stable around 200 pounds over the last 2 weeks.  Overall, her volume seems to be stable.  We again discussed GDMT for HFpEF.  I think she would do well with the addition of spironolactone.  She is on a fixed income and likely would have difficulty affording SGLT2 inhibitor or Entresto. DC K+ Start spironolactone 12.5 mg daily Continue Lasix 20 mg daily BMET weekly x 2 Follow-up 4 weeks  Precordial chest pain She notes chest discomfort with exertion from time to time.  She had a low risk Myoview in 2021.  No ischemic changes on electrocardiogram today.   Lexiscan Myoview  Permanent atrial fibrillation ([place]) Heart is uncontrolled.  Question if elevated heart rates are contributing to some of her symptoms. Continue Eliquis 5 mg twice daily Increase metoprolol to tartrate to 75 mg twice daily Follow-up 4 weeks  Essential hypertension The patient's blood pressure is controlled on her current regimen.  Continue current therapy.   Aortic valve regurgitation Mild to moderate on most recent echocardiogram.  Lower extremity edema She has asymmetric lower extremity edema.  She notes worsening edema on the left since a dog bite several months ago.  She remains on anticoagulation with Eliquis.  It would be unusual for her to have failure and develop a DVT.  However, she is concerned about this.  Obtain left lower extremity venous Doppler rule out DVT, Baker's cyst.         Shared Decision Making/Informed Consent The risks [chest pain, shortness of breath, cardiac arrhythmias, dizziness, blood pressure fluctuations, myocardial infarction, stroke/transient ischemic attack, nausea, vomiting, allergic reaction, radiation exposure, metallic taste sensation and life-threatening complications (estimated to be 1 in 10,000)], benefits (risk stratification, diagnosing coronary artery disease, treatment guidance) and alternatives of a nuclear stress test were discussed in  detail with Ms. Willette and she agrees to proceed.   Dispo:  Return in about 4 weeks (around 03/16/2022) for Routine Follow Up, w/ Dr. Acie Fredrickson, or Richardson Dopp, PA-C.  Medication Adjustments/Labs and Tests Ordered: Current medicines are reviewed at length with the patient today.  Concerns regarding medicines are outlined above.  Tests Ordered: Orders Placed This Encounter  Procedures   Basic metabolic panel   Basic metabolic panel   MYOCARDIAL PERFUSION IMAGING   EKG 12-Lead   VAS [place] LOWER EXTREMITY VENOUS (DVT)   Medication Changes: Meds ordered this encounter  Medications   spironolactone (ALDACTONE) 25 MG tablet    Sig: Take 0.5 tablets (12.5 mg total) by mouth daily.    Dispense:  45 tablet    Refill:  3  metoprolol tartrate (LOPRESSOR) 50 MG tablet    Sig: Take 1.5 tablets (75 mg total) by mouth 2 (two) times daily.    Dispense:  270 tablet    Refill:  3   Signed, Richardson Dopp, PA-C  02/16/2022 10:42 AM    [address], [place], [place]  [address] Phone: [phone]; Fax: [phone]

## 2022-02-13 NOTE — Telephone Encounter (Signed)
Patient returned Rn's call.

## 2022-02-13 NOTE — Telephone Encounter (Signed)
Spoke with patient about form. She states she will call [employer] to verify they received it or bring a duplicate to our office when she sees scott Crowder, [place] this upcoming week.

## 2022-02-13 NOTE — Telephone Encounter (Signed)
Returned call to patient, but no answer-left detailed voicemail asking if she still needs this form or when she left it? I will gladly take care of it for her, but don't have anything on hand. Will await call back from patient.

## 2022-02-16 ENCOUNTER — Ambulatory Visit: Payer: Medicare Other | Attending: Physician Assistant | Admitting: Physician Assistant

## 2022-02-16 ENCOUNTER — Encounter: Payer: Self-pay | Admitting: Physician Assistant

## 2022-02-16 ENCOUNTER — Other Ambulatory Visit: Payer: Self-pay | Admitting: *Deleted

## 2022-02-16 VITALS — BP 138/80 | HR 104 | Ht 66.0 in | Wt 201.8 lb

## 2022-02-16 DIAGNOSIS — I5033 Acute on chronic diastolic (congestive) heart failure: Secondary | ICD-10-CM | POA: Diagnosis not present

## 2022-02-16 DIAGNOSIS — I4821 Permanent atrial fibrillation: Secondary | ICD-10-CM | POA: Diagnosis not present

## 2022-02-16 DIAGNOSIS — R600 Localized edema: Secondary | ICD-10-CM

## 2022-02-16 DIAGNOSIS — R072 Precordial pain: Secondary | ICD-10-CM | POA: Diagnosis not present

## 2022-02-16 DIAGNOSIS — I10 Essential (primary) hypertension: Secondary | ICD-10-CM

## 2022-02-16 DIAGNOSIS — I351 Nonrheumatic aortic (valve) insufficiency: Secondary | ICD-10-CM

## 2022-02-16 LAB — BASIC METABOLIC PANEL
BUN/Creatinine Ratio: 24 (ref 12–28)
BUN: 19 mg/dL (ref 8–27)
CO2: 24 mmol/L (ref 20–29)
Calcium: 9.7 mg/dL (ref 8.7–10.3)
Chloride: 104 mmol/L (ref 96–106)
Creatinine, Ser: 0.8 mg/dL (ref 0.57–1.00)
Glucose: 95 mg/dL (ref 70–99)
Potassium: 4.6 mmol/L (ref 3.5–5.2)
Sodium: 138 mmol/L (ref 134–144)
eGFR: 76 mL/min/{1.73_m2} (ref >59)

## 2022-02-16 MED ORDER — METOPROLOL TARTRATE 50 MG PO TABS: 75.0000 mg | ORAL_TABLET | Freq: Two times a day (BID) | ORAL | 3 refills | Status: DC

## 2022-02-16 MED ORDER — SPIRONOLACTONE 25 MG PO TABS: 12.5000 mg | ORAL_TABLET | Freq: Every day | ORAL | 3 refills | Status: DC

## 2022-02-16 NOTE — Assessment & Plan Note (Signed)
She notes chest discomfort with exertion from time to time.  She had a low risk Myoview in 2021.  No ischemic changes on electrocardiogram today.   Lexiscan Myoview

## 2022-02-16 NOTE — Addendum Note (Signed)
Addended byKathlen Mody, Nicki Reaper T on: 02/16/2022 10:43 AM   Modules accepted: Orders

## 2022-02-16 NOTE — Patient Instructions (Signed)
Medication Instructions:  Your physician has recommended you make the following change in your medication:   STOP Potassium  START Spironolactone 25 mg taking only 1/2 tablet daily  INCREASE the Metoprolol to 50 mg taking 1 1/2 tablet twice a day   *If you need a refill on your cardiac medications before your next appointment, please call your pharmacy*   Lab Work: 1WEEK:  BMET  2 WEEKS:  BMET  If you have labs (blood work) drawn today and your tests are completely normal, you will receive your results only by: [place] (if you have MyChart) OR A paper copy in the mail If you have any lab test that is abnormal or we need to change your treatment, we will call you to review the results.   Testing/Procedures: Your physician has requested that you have a lower or upper extremity venous duplex. This test is an ultrasound of the veins in the legs or arms. It looks at venous blood flow that carries blood from the heart to the legs or arms. Allow one hour for a Lower Venous exam. Allow thirty minutes for an Upper Venous exam. There are no restrictions or special instructions.    Your physician has requested that you have a lexiscan myoview. For further information please visit [email or web address]. Please follow instruction sheet, BELOW:    You are scheduled for a Myocardial Perfusion Imaging Study  Please arrive 15 minutes prior to your appointment time for registration and insurance purposes.  The test will take approximately 3 to 4 hours to complete; you may bring reading material.  If someone comes with you to your appointment, they will need to remain in the main lobby due to limited space in the testing area. **If you are pregnant or breastfeeding, please notify the nuclear lab prior to your appointment**  How to prepare for your Myocardial Perfusion Test: Do not eat or drink 3 hours prior to your test, except you may have water. Do not consume products containing caffeine  (regular or decaffeinated) 12 hours prior to your test. (ex: coffee, chocolate, sodas, tea). Do bring a list of your current medications with you.  If not listed below, you may take your medications as normal. Do wear comfortable clothes (no dresses or overalls) and walking shoes, tennis shoes preferred (No heels or open toe shoes are allowed). Do NOT wear cologne, perfume, aftershave, or lotions (deodorant is allowed). If these instructions are not followed, your test will have to be rescheduled.     Follow-Up: At [facility], you and your health needs are our priority.  As part of our continuing mission to provide you with exceptional heart care, we have created designated Provider Care Teams.  These Care Teams include your primary Cardiologist (physician) and Advanced Practice Providers (APPs -  Physician Assistants and Nurse Practitioners) who all work together to provide you with the care you need, when you need it.  We recommend signing up for the patient portal called "MyChart".  Sign up information is provided on this After Visit Summary.  MyChart is used to connect with patients for Virtual Visits (Telemedicine).  Patients are able to view lab/test results, encounter notes, upcoming appointments, etc.  Non-urgent messages can be sent to your provider as well.   To learn more about what you can do with MyChart, go to [email or web address].    Your next appointment:   4 week(s)  The format for your next appointment:   In Person  Provider:   Arnette Norris  Nahser, MD  or Richardson Dopp, PA-C         Other Instructions   Important Information About Sugar

## 2022-02-16 NOTE — Assessment & Plan Note (Signed)
The patient's blood pressure is controlled on her current regimen.  Continue current therapy.   

## 2022-02-16 NOTE — Assessment & Plan Note (Signed)
She recently had increased weight, swelling and increased shortness of breath.  She had slight improvement in her swelling with an increased dose of furosemide.  Her weight when she was seen last in August was 206.  She is down 5 pounds to 201 today.  She brings in a list of her weights from home.  They have remained stable around 200 pounds over the last 2 weeks.  Overall, her volume seems to be stable.  We again discussed GDMT for HFpEF.  I think she would do well with the addition of spironolactone.  She is on a fixed income and likely would have difficulty affording SGLT2 inhibitor or Entresto. DC K+ Start spironolactone 12.5 mg daily Continue Lasix 20 mg daily BMET weekly x 2 Follow-up 4 weeks

## 2022-02-16 NOTE — Assessment & Plan Note (Signed)
Mild to moderate on most recent echocardiogram.

## 2022-02-16 NOTE — Assessment & Plan Note (Signed)
Heart is uncontrolled.  Question if elevated heart rates are contributing to some of her symptoms. Continue Eliquis 5 mg twice daily Increase metoprolol to tartrate to 75 mg twice daily Follow-up 4 weeks

## 2022-02-16 NOTE — Assessment & Plan Note (Signed)
She has asymmetric lower extremity edema.  She notes worsening edema on the left since a dog bite several months ago.  She remains on anticoagulation with Eliquis.  It would be unusual for her to have failure and develop a DVT.  However, she is concerned about this.  Obtain left lower extremity venous Doppler rule out DVT, Baker's cyst.

## 2022-02-17 ENCOUNTER — Other Ambulatory Visit: Payer: Self-pay | Admitting: *Deleted

## 2022-02-17 DIAGNOSIS — I10 Essential (primary) hypertension: Secondary | ICD-10-CM

## 2022-02-18 ENCOUNTER — Ambulatory Visit: Payer: Medicare Other | Admitting: Physician Assistant

## 2022-02-25 ENCOUNTER — Telehealth (HOSPITAL_COMMUNITY): Payer: Self-pay | Admitting: *Deleted

## 2022-02-25 NOTE — Telephone Encounter (Signed)
Patient given detailed instructions per Myocardial Perfusion Study Information Sheet for the test on 03/04/21 Patient notified to arrive 15 minutes early and that it is imperative to arrive on time for appointment to keep from having the test rescheduled.  If you need to cancel or reschedule your appointment, please call the office within 24 hours of your appointment. . Patient verbalized understanding. Kirstie Peri, RN

## 2022-02-26 ENCOUNTER — Ambulatory Visit (HOSPITAL_COMMUNITY)
Admission: RE | Admit: 2022-02-26 | Discharge: 2022-02-26 | Disposition: A | Discharge status: Home / Self Care | Payer: Medicare Other | Source: Ambulatory Visit | Attending: Cardiology | Admitting: Cardiology

## 2022-02-26 ENCOUNTER — Ambulatory Visit: Payer: Medicare Other

## 2022-02-26 DIAGNOSIS — I10 Essential (primary) hypertension: Secondary | ICD-10-CM

## 2022-02-26 DIAGNOSIS — R600 Localized edema: Secondary | ICD-10-CM | POA: Insufficient documentation

## 2022-02-26 DIAGNOSIS — I5033 Acute on chronic diastolic (congestive) heart failure: Secondary | ICD-10-CM

## 2022-02-26 DIAGNOSIS — R072 Precordial pain: Secondary | ICD-10-CM

## 2022-02-27 LAB — BASIC METABOLIC PANEL
BUN/Creatinine Ratio: 27 (ref 12–28)
BUN: 24 mg/dL (ref 8–27)
CO2: 24 mmol/L (ref 20–29)
Calcium: 9.1 mg/dL (ref 8.7–10.3)
Chloride: 103 mmol/L (ref 96–106)
Creatinine, Ser: 0.89 mg/dL (ref 0.57–1.00)
Glucose: 113 mg/dL — ABNORMAL HIGH (ref 70–99)
Potassium: 4.4 mmol/L (ref 3.5–5.2)
Sodium: 142 mmol/L (ref 134–144)
eGFR: 67 mL/min/{1.73_m2} (ref >59)

## 2022-03-04 ENCOUNTER — Ambulatory Visit: Payer: Medicare HMO | Attending: Physician Assistant

## 2022-03-04 ENCOUNTER — Ambulatory Visit (HOSPITAL_BASED_OUTPATIENT_CLINIC_OR_DEPARTMENT_OTHER): Payer: Medicare HMO

## 2022-03-04 DIAGNOSIS — R600 Localized edema: Secondary | ICD-10-CM | POA: Insufficient documentation

## 2022-03-04 DIAGNOSIS — I5033 Acute on chronic diastolic (congestive) heart failure: Secondary | ICD-10-CM | POA: Diagnosis not present

## 2022-03-04 DIAGNOSIS — R072 Precordial pain: Secondary | ICD-10-CM | POA: Insufficient documentation

## 2022-03-04 LAB — MYOCARDIAL PERFUSION IMAGING
LV dias vol: 47 mL (ref 46–106)
LV sys vol: 23 mL
Nuc Stress EF: 50 %
Peak HR: 109 {beats}/min
Rest HR: 82 {beats}/min
Rest Nuclear Isotope Dose: 10.7 mCi
SDS: 5
SRS: 3
SSS: 8
ST Depression (mm): 0 mm
Stress Nuclear Isotope Dose: 32.5 mCi
TID: 0.85

## 2022-03-04 LAB — BASIC METABOLIC PANEL
BUN/Creatinine Ratio: 26 (ref 12–28)
BUN: 20 mg/dL (ref 8–27)
CO2: 25 mmol/L (ref 20–29)
Calcium: 9.2 mg/dL (ref 8.7–10.3)
Chloride: 102 mmol/L (ref 96–106)
Creatinine, Ser: 0.77 mg/dL (ref 0.57–1.00)
Glucose: 112 mg/dL — ABNORMAL HIGH (ref 70–99)
Potassium: 4.1 mmol/L (ref 3.5–5.2)
Sodium: 140 mmol/L (ref 134–144)
eGFR: 80 mL/min/{1.73_m2} (ref >59)

## 2022-03-04 MED ORDER — REGADENOSON 0.4 MG/5ML IV SOLN
0.4000 mg | Freq: Once | INTRAVENOUS | Status: AC
  Administered 2022-03-04: 0.4 mg via INTRAVENOUS

## 2022-03-04 MED ORDER — TECHNETIUM TC 99M TETROFOSMIN IV KIT
10.7000 | PACK | Freq: Once | INTRAVENOUS | Status: AC | PRN
  Administered 2022-03-04: 10.7 via INTRAVENOUS

## 2022-03-04 MED ORDER — TECHNETIUM TC 99M TETROFOSMIN IV KIT
32.5000 | PACK | Freq: Once | INTRAVENOUS | Status: AC | PRN
  Administered 2022-03-04: 32.5 via INTRAVENOUS

## 2022-03-05 ENCOUNTER — Encounter: Payer: Self-pay | Admitting: Physician Assistant

## 2022-03-10 ENCOUNTER — Encounter (HOSPITAL_COMMUNITY): Payer: Medicare Other

## 2022-03-17 ENCOUNTER — Encounter: Payer: Self-pay | Admitting: Physician Assistant

## 2022-03-17 ENCOUNTER — Ambulatory Visit: Payer: Medicare HMO | Attending: Physician Assistant | Admitting: Physician Assistant

## 2022-03-17 VITALS — BP 132/62 | HR 87 | Ht 66.0 in | Wt 206.8 lb

## 2022-03-17 DIAGNOSIS — I10 Essential (primary) hypertension: Secondary | ICD-10-CM | POA: Diagnosis not present

## 2022-03-17 DIAGNOSIS — I4821 Permanent atrial fibrillation: Secondary | ICD-10-CM

## 2022-03-17 DIAGNOSIS — I5032 Chronic diastolic (congestive) heart failure: Secondary | ICD-10-CM | POA: Diagnosis not present

## 2022-03-17 MED ORDER — EMPAGLIFLOZIN 10 MG PO TABS: 10.0000 mg | ORAL_TABLET | Freq: Every day | ORAL | 3 refills | Status: DC

## 2022-03-17 NOTE — Progress Notes (Signed)
Cardiology Office Note:    Date:  03/17/2022   ID:  [name], DOB [birth year], MRN [id number]  PCP:  Ernestene Kiel, [place] [facility] Cardiologist:  Mertie Moores, MD     Referring MD: Ernestene Kiel, MD   Chief Complaint:  F/u for CHF, AFib    Patient Profile: Permanent atrial fibrillation  Heart failure with preserved ejection fraction  TTE 11/21: EF 50-55, RVSP 33.8, mild reduced RVSF TTE 09/12/21: EF 60-65, no RWMA, normal RVSF, mildly elevated PASP, RVSP 40.9, severe BAE, trivial MR, mild to mod AI  Mild to mod aortic insufficiency  Hypertension  Fhx of CAD  Myoview 01/08/20: no ischemia or scar  Myoview 03/04/22: EF 50, NL perfusion, low risk  Hyperlipidemia  GERD  Hypothyroidism      History of Present Illness:   Kayla Hale is a 77 y.o. female with the above problem list.  She was last seen 02/16/22 for evaluation of chest pain, shortness of breath, leg edema. Venous dopplers were neg for DVT in both legs. A nuclear stress test was low risk, neg for ischemia. I added Spironolactone to her regimen to further advance GDMT of HFpEF. Her HR was uncontrolled and I also adjusted her Metoprolol tartrate to 75 mg twice daily. She returns for f/u of CHF, AFib. She is here alone. She is feeling better. She notes less shortness of breath. She still has to walk slowly if she does anything more strenuous. Her edema is unchanged. She has not had further chest pain. She has not had syncope, orthopnea, hematochezia or hematuria. She does not easy bruising.       Reviewed and updated this encounter:   Tobacco  Allergies  Meds  Problems  Med Hx  Surg Hx  Fam Hx     ROS  Labs/Other Test Reviewed:   Recent Labs: 09/01/2021: Hemoglobin 12.4; Platelets 201 09/12/2021: NT-Pro BNP 1,399 03/04/2022: BUN 20; Creatinine, Ser 0.77; Potassium 4.1; Sodium 140    Risk Assessment/Calculations/Metrics:    CHA2DS2-VASc Score = 5   This indicates a 7.2%  annual risk of stroke. The patient's score is based upon: CHF History: 1 HTN History: 1 Diabetes History: 0 Stroke History: 0 Vascular Disease History: 0 Age Score: 2 Gender Score: 1            Physical Exam:   VS:  BP 132/62   Pulse 87   Ht '5\' 6"'$  (1.676 m)   Wt 206 lb 12.8 oz (93.8 kg)   SpO2 93%   BMI 33.38 kg/m    Wt Readings from Last 3 Encounters:  03/17/22 206 lb 12.8 oz (93.8 kg)  02/16/22 201 lb 12.8 oz (91.5 kg)  10/10/21 206 lb 9.6 oz (93.7 kg)    Constitutional:      Appearance: Healthy appearance. Not in distress.  Neck:     Vascular: JVD normal.  Pulmonary:     Breath sounds: Normal breath sounds. No wheezing. No rales.  Cardiovascular:     Normal rate. Irregularly irregular rhythm.     Murmurs: There is no murmur.  Edema:    Pretibial: bilateral 1+ edema of the pretibial area. Abdominal:     Palpations: Abdomen is soft.          ASSESSMENT & PLAN:   (HFpEF) heart failure with preserved ejection fraction (HCC) EF 60-65. NYHA IIb-III. Volume overall stable. However, she still has some volume to lose. We again discussed the role for SGLT2 inhibitors. We have  tried to avoid this in the past as she is on a fixed income. However, she is willing to try it now. We discussed the mechanism of action and the potential for GU side effects.  Continue Furosemide 20 mg, Spironolactone 12.5 mg Start [name] 10 mg once daily (Samples; Will give assistance forms as well) BMET in 2 weeks F/u in 2 mos.  Permanent atrial fibrillation (HCC) Rate is better controlled. Continue Metoprolol 75 mg twice daily, Eliquis 5 mg twice daily.   Essential hypertension The patient's blood pressure is controlled on her current regimen.  Continue current therapy with metoprolol tartrate 75 mg twice daily, spironolactone 12.5 mg once daily.            Dispo:  Return in about 2 months (around 05/16/2022) for Routine Follow Up, w/ Dr. Acie Fredrickson, or Richardson Dopp, PA-C.     Signed, Richardson Dopp, PA-C  03/17/2022 2:45 PM    [address], [place], [place]  [address] Phone: [phone]; Fax: [phone]

## 2022-03-17 NOTE — Assessment & Plan Note (Signed)
EF 60-65. NYHA IIb-III. Volume overall stable. However, she still has some volume to lose. We again discussed the role for SGLT2 inhibitors. We have tried to avoid this in the past as she is on a fixed income. However, she is willing to try it now. We discussed the mechanism of action and the potential for GU side effects.  Continue Furosemide 20 mg, Spironolactone 12.5 mg Start [name] 10 mg once daily (Samples; Will give assistance forms as well) BMET in 2 weeks F/u in 2 mos.

## 2022-03-17 NOTE — Assessment & Plan Note (Signed)
The patient's blood pressure is controlled on her current regimen.  Continue current therapy with metoprolol tartrate 75 mg twice daily, spironolactone 12.5 mg once daily.

## 2022-03-17 NOTE — Patient Instructions (Signed)
Medication Instructions:  Your physician has recommended you make the following change in your medication:   START [name] 10 mg taking 1 daily  *If you need a refill on your cardiac medications before your next appointment, please call your pharmacy*   Lab Work: 2 WEEKS:  BMET  If you have labs (blood work) drawn today and your tests are completely normal, you will receive your results only by: [place] (if you have MyChart) OR A paper copy in the mail If you have any lab test that is abnormal or we need to change your treatment, we will call you to review the results.   Testing/Procedures: None ordered   Follow-Up: At [facility], you and your health needs are our priority.  As part of our continuing mission to provide you with exceptional heart care, we have created designated Provider Care Teams.  These Care Teams include your primary Cardiologist (physician) and Advanced Practice Providers (APPs -  Physician Assistants and Nurse Practitioners) who all work together to provide you with the care you need, when you need it.  We recommend signing up for the patient portal called "MyChart".  Sign up information is provided on this After Visit Summary.  MyChart is used to connect with patients for Virtual Visits (Telemedicine).  Patients are able to view lab/test results, encounter notes, upcoming appointments, etc.  Non-urgent messages can be sent to your provider as well.   To learn more about what you can do with MyChart, go to [email or web address].    Your next appointment:   2 month(s)  Provider:   Mertie Moores, MD  or Richardson Dopp, PA-C         Other Instructions

## 2022-03-17 NOTE — Assessment & Plan Note (Signed)
Rate is better controlled. Continue Metoprolol 75 mg twice daily, Eliquis 5 mg twice daily.

## 2022-03-18 ENCOUNTER — Telehealth: Payer: Self-pay | Admitting: Cardiovascular Disease

## 2022-03-18 NOTE — Telephone Encounter (Signed)
Pt c/o medication issue:  1. Name of Medication:   empagliflozin ([name]) 10 MG TABS tablet    2. How are you currently taking this medication (dosage and times per day)?   3. Are you having a reaction (difficulty breathing--STAT)?   4. What is your medication issue?   Patient would like to know if she should still be taking her old medications with [name]. Please advise.

## 2022-03-18 NOTE — Telephone Encounter (Signed)
Returned call to pt.  She has been made aware that she will be taking [name] in addition to her other medications, that nothing was stopped.  She was grateful for the call back.

## 2022-03-18 NOTE — Telephone Encounter (Signed)
Patient called to say she got medication, her co pay was zero. Please advise

## 2022-03-20 NOTE — Telephone Encounter (Signed)
Returned call to patient. Kayla Hale had already spoke with her in clarification of need to continue [name] in addition to other medications, but she was calling back to let us know that her copay on the [name] was $0. She was just wanting to let Kayla Hale know this since she states they had discussed price being an issue. No further questions.

## 2022-04-01 ENCOUNTER — Ambulatory Visit: Payer: Medicare HMO | Attending: Cardiovascular Disease

## 2022-04-01 DIAGNOSIS — I4821 Permanent atrial fibrillation: Secondary | ICD-10-CM | POA: Diagnosis not present

## 2022-04-01 DIAGNOSIS — I5032 Chronic diastolic (congestive) heart failure: Secondary | ICD-10-CM | POA: Diagnosis not present

## 2022-04-02 LAB — BASIC METABOLIC PANEL WITH GFR
BUN/Creatinine Ratio: 23 (ref 12–28)
BUN: 19 mg/dL (ref 8–27)
CO2: 20 mmol/L (ref 20–29)
Calcium: 9.1 mg/dL (ref 8.7–10.3)
Chloride: 104 mmol/L (ref 96–106)
Creatinine, Ser: 0.84 mg/dL (ref 0.57–1.00)
Glucose: 120 mg/dL — ABNORMAL HIGH (ref 70–99)
Potassium: 4.7 mmol/L (ref 3.5–5.2)
Sodium: 141 mmol/L (ref 134–144)
eGFR: 72 mL/min/1.73

## 2022-04-16 DIAGNOSIS — R29898 Other symptoms and signs involving the musculoskeletal system: Secondary | ICD-10-CM | POA: Diagnosis not present

## 2022-04-16 DIAGNOSIS — R32 Unspecified urinary incontinence: Secondary | ICD-10-CM | POA: Diagnosis not present

## 2022-04-16 DIAGNOSIS — Z1331 Encounter for screening for depression: Secondary | ICD-10-CM | POA: Diagnosis not present

## 2022-04-16 DIAGNOSIS — D6859 Other primary thrombophilia: Secondary | ICD-10-CM | POA: Diagnosis not present

## 2022-04-16 DIAGNOSIS — I10 Essential (primary) hypertension: Secondary | ICD-10-CM | POA: Diagnosis not present

## 2022-04-16 DIAGNOSIS — I4891 Unspecified atrial fibrillation: Secondary | ICD-10-CM | POA: Diagnosis not present

## 2022-04-16 DIAGNOSIS — I5022 Chronic systolic (congestive) heart failure: Secondary | ICD-10-CM | POA: Diagnosis not present

## 2022-04-16 DIAGNOSIS — E039 Hypothyroidism, unspecified: Secondary | ICD-10-CM | POA: Diagnosis not present

## 2022-04-16 DIAGNOSIS — Z0000 Encounter for general adult medical examination without abnormal findings: Secondary | ICD-10-CM | POA: Diagnosis not present

## 2022-04-17 ENCOUNTER — Other Ambulatory Visit: Payer: Self-pay | Admitting: *Deleted

## 2022-04-17 MED ORDER — SPIRONOLACTONE 25 MG PO TABS: 12.5000 mg | ORAL_TABLET | Freq: Every day | ORAL | 3 refills | Status: DC

## 2022-04-17 MED ORDER — METOPROLOL TARTRATE 50 MG PO TABS: 75.0000 mg | ORAL_TABLET | Freq: Two times a day (BID) | ORAL | 3 refills | Status: DC

## 2022-04-30 DIAGNOSIS — M25561 Pain in right knee: Secondary | ICD-10-CM | POA: Diagnosis not present

## 2022-04-30 DIAGNOSIS — M25562 Pain in left knee: Secondary | ICD-10-CM | POA: Diagnosis not present

## 2022-05-14 DIAGNOSIS — M25562 Pain in left knee: Secondary | ICD-10-CM | POA: Diagnosis not present

## 2022-05-14 DIAGNOSIS — M25561 Pain in right knee: Secondary | ICD-10-CM | POA: Diagnosis not present

## 2022-05-17 ENCOUNTER — Encounter: Payer: Self-pay | Admitting: Cardiovascular Disease

## 2022-05-17 NOTE — Progress Notes (Unsigned)
Cardiology Office Note   Date:  05/18/2022   ID:  Kayla Hale, Kayla Hale [birth year], MRN [id number]  PCP:  Ernestene Kiel, MD  Cardiologist:   Mertie Moores, MD   Chief Complaint  Patient presents with   Congestive Heart Failure          Hypertension        1. Atrial fibrillation - CHADS VASC score = 3 2. Hypertension  Kayla Hale is a 77 yo who is referred for atrial fib.  Hx of HTN.   She is basically a symptomatically she was found to have an irregular heart rate on  A visit to Urgent care ( which was for a bladder infection) . EKG revealed atrial fibrillation. She does have occasional episodes of palpitations but he seemed to be more related to stress.  Nonsmoker Rare ETOH -   Fhx - father died of MI. ( age 52s), sister has CAd,,   April. 26, 2016: Kayla Hale is a 77 y.o. female who presents for  Atrial fib.  She's done very well. She's been keeping a record of her blood pressure readings and for the most part they are  In the normal  Range. She has not been cardioverted.   She has had atrial fib for many years.    12/24/2014:  She is doing well Is in the donut hole ( mostly from Eliquis )  No CP or dyspnea.  Oct. 31, 2017:  Kayla Hale is seen Today for follow-up of her hypertension and atrial fibrillation. Is not working anymore at [employer] ( previously worked at [employer] )  Exercising - walks every day for 30 minutes.     June 24, 2016:  Kayla Hale is seen for follow up .  Is working - hosting open houses on the weekends. . No CP or dyspnea.   Tries to walk every day for 30 minutes   March 09, 2017:  Doing well.  INR was 2.1 today .   Wants to change to a DOAC ,   She Will call her insurance co. Has gained some weight .  Walking 3 times a week .   Is watching her carbs ( recently )   Jan. 8, 2020  Kayla Hale is seen today .  Has persistent Afib.    Limited by knee pain .   Tries to walk or ride her bike 3 days a week .  No CP or dyspnea.   Jan. 12,  2021;  Kayla Hale is seen today for follow up of her atrial fib  Feeling well.   Tries to work out 3 times a week  HR remains elevated.   June 23, 2019: Kayla Hale is seen today for follow-up of her atrial fibrillation.  She has been having some leg swelling and presents today for further evaluation. Had some ham for easter.  Also ate hot dogs.  Developed the foot swelling 2 days after that.  Swelling has gone down .  Avoids salt for the most part .  Does not want to start a diuretic because she is on Ditropan   June, 3, 2022: Kayla Hale is seen today for HTN, leg edema Diet has improved.  Still has some leg edema   May 18, 2022 Kayla Hale is seen today for HTN, leg edema , chronic diastolic CHF , atrial fib  No CP ,  Breathing is ok Tries to ride her bike 3 days a week  Wt is 202 lbs   Legs look better  Past Medical History:  Diagnosis Date   Aortic valve regurgitation 08/31/2021   Echocardiogram 08/2021: EF 60-65, no RWMA, normal RVSF, mildly elevated PASP (RVSP 40.9), severe BAE, trivial MR, mild to moderate AI   Atrial fibrillation ([place])    Chronic kidney disease    STAGE 2   Diverticulosis 2004   Dyslipidemia    Gallstones    GERD (gastroesophageal reflux disease)    Heart failure with preserved EF 01/09/2020   Echocardiogram 11/21: EF 50-5, no RWMA, mild reduced RVSF, RVSP 33.8, mod BAE, trivial MR, mild AI   Hyperlipidemia    Hypertension    Hypothyroidism    Mild anemia    Mild obesity    Nuclear stress test    Myoview 11/21: min apical thinning, no scar or ischemia, EF 42; intermediate risk due to calculated EF (EF looks better than calculated).// Myoview 03/04/22: EF 23, normal perfusion; low risk   Seasonal allergies    Shingles 2005   LOWER BACK   Thyroid disease    Urinary incontinence     Past Surgical History:  Procedure Laterality Date   CHOLECYSTECTOMY N/A 03/16/2014   Procedure: LAPAROSCOPIC CHOLECYSTECTOMY WITH INTRAOPERATIVE CHOLANGIOGRAM;  Surgeon: Rolm Bookbinder, MD;  Location: [facility];  Service: General;  Laterality: N/A;   COLONOSCOPY     TUBAL LIGATION       Current Outpatient Medications  Medication Sig Dispense Refill   MYRBETRIQ 25 MG TB24 tablet Take 25 mg by mouth daily.     ALPRAZolam (XANAX) 0.5 MG tablet Take 0.5 mg by mouth daily.     apixaban (ELIQUIS) 5 MG TABS tablet Take 1 tablet (5 mg total) by mouth 2 (two) times daily. 180 tablet 1   Ascorbic Acid (VITAMIN C) 1000 MG tablet Take 1,000 mg by mouth daily.     atorvastatin (LIPITOR) 10 MG tablet Take 10 mg by mouth daily.     Calcium Carbonate-Vitamin D (CALCIUM-VITAMIN D) 500-200 MG-UNIT per tablet Take 1 tablet by mouth daily.     cholecalciferol (VITAMIN D) 1000 UNITS tablet Take 1,000 Units by mouth daily.     colestipol (COLESTID) 1 g tablet Take 1 g by mouth 2 (two) times daily.     empagliflozin ([name]) 10 MG TABS tablet Take 1 tablet (10 mg total) by mouth daily before breakfast. 90 tablet 3   furosemide (LASIX) 20 MG tablet Take 1 tablet (20 mg total) by mouth daily. 90 tablet 3   levothyroxine (SYNTHROID, LEVOTHROID) 75 MCG tablet Take 75 mcg by mouth daily before breakfast.     MAGNESIUM-ZINC PO Take 1 tablet by mouth daily.     metoprolol tartrate (LOPRESSOR) 50 MG tablet Take 1.5 tablets (75 mg total) by mouth 2 (two) times daily. 270 tablet 3   Misc Natural Products (AIRBORNE ELDERBERRY PO) Take 1 tablet by mouth daily.     Multiple Vitamin (MULTIVITAMIN) tablet Take 1 tablet by mouth daily.     Omega-3 Fatty Acids (FISH OIL) 1000 MG CAPS Take 1 capsule by mouth daily.     omeprazole (PRILOSEC) 20 MG capsule Take 20 mg by mouth daily.     spironolactone (ALDACTONE) 25 MG tablet Take 0.5 tablets (12.5 mg total) by mouth daily. 45 tablet 3   UNABLE TO FIND cbd oil qhs     No current facility-administered medications for this visit.    Allergies:   Lisinopril and Norvasc [amlodipine besylate]    Social History:  The patient  reports that she has never  smoked. She has never used smokeless tobacco. She reports that she does not drink alcohol and does not use drugs.   Family History:  The patient's family history includes CVA in her brother; Diabetes in her brother and sister; Heart attack in her father and sister; Hypertension in her father.    ROS: As per current history.  Otherwise negative.  Physical Exam: Blood pressure 136/78, pulse 90, height 5\' 6"  (1.676 m), weight 202 lb 9.6 oz (91.9 kg), SpO2 97 %.       GEN:  elderly female,  in no acute distress HEENT: Normal NECK: No JVD; No carotid bruits LYMPHATICS: No lymphadenopathy CARDIAC: irreg. Irreg.  RESPIRATORY:  Clear to auscultation without rales, wheezing or rhonchi  ABDOMEN: Soft, non-tender, non-distended MUSCULOSKELETAL:  No edema; No deformity  SKIN: Warm and dry NEUROLOGIC:  Alert and oriented x 3    EKG:          Recent Labs: 09/01/2021: Hemoglobin 12.4; Platelets 201 09/12/2021: NT-Pro BNP 1,399 04/01/2022: BUN 19; Creatinine, Ser 0.84; Potassium 4.7; Sodium 141    Lipid Panel No results found for: "CHOL", "TRIG", "HDL", "CHOLHDL", "VLDL", "LDLCALC", "LDLDIRECT"    Wt Readings from Last 3 Encounters:  05/18/22 202 lb 9.6 oz (91.9 kg)  03/17/22 206 lb 12.8 oz (93.8 kg)  02/16/22 201 lb 12.8 oz (91.5 kg)      Other studies Reviewed: Additional studies/ records that were reviewed today include: . Review of the above records demonstrates:    ASSESSMENT AND PLAN:  1. Atrial fibrillation - CHADS VASC score = 3 HR is well controlled.      2. Hypertension - .  BP looks good .  Cont current meds.   3. Hyperlipidemia:     stable.  Follow up with her primary MD    The following changes have been made:  no change  Labs/ tests ordered today include:   No orders of the defined types were placed in this encounter.    Disposition:       Signed, Mertie Moores, MD  05/18/2022 3:14 PM    [facility] [place],  [place],   [address] Phone: [phone]; Fax: [phone]

## 2022-05-18 ENCOUNTER — Ambulatory Visit: Payer: Medicare HMO | Attending: Cardiovascular Disease | Admitting: Cardiovascular Disease

## 2022-05-18 ENCOUNTER — Encounter: Payer: Self-pay | Admitting: Cardiovascular Disease

## 2022-05-18 VITALS — BP 136/78 | HR 90 | Ht 66.0 in | Wt 202.6 lb

## 2022-05-18 DIAGNOSIS — R600 Localized edema: Secondary | ICD-10-CM

## 2022-05-18 DIAGNOSIS — I509 Heart failure, unspecified: Secondary | ICD-10-CM

## 2022-05-18 DIAGNOSIS — I5032 Chronic diastolic (congestive) heart failure: Secondary | ICD-10-CM | POA: Diagnosis not present

## 2022-05-18 NOTE — Patient Instructions (Signed)
Medication Instructions:   Your physician recommends that you continue on your current medications as directed. Please refer to the Current Medication list given to you today.  *If you need a refill on your cardiac medications before your next appointment, please call your pharmacy*    Follow-Up: At [facility], you and your health needs are our priority.  As part of our continuing mission to provide you with exceptional heart care, we have created designated Provider Care Teams.  These Care Teams include your primary Cardiologist (physician) and Advanced Practice Providers (APPs -  Physician Assistants and Nurse Practitioners) who all work together to provide you with the care you need, when you need it.  We recommend signing up for the patient portal called "MyChart".  Sign up information is provided on this After Visit Summary.  MyChart is used to connect with patients for Virtual Visits (Telemedicine).  Patients are able to view lab/test results, encounter notes, upcoming appointments, etc.  Non-urgent messages can be sent to your provider as well.   To learn more about what you can do with MyChart, go to [email or web address].    Your next appointment:   1 year(s)  Provider:   Philip Nahser, MD       

## 2022-05-28 DIAGNOSIS — M25562 Pain in left knee: Secondary | ICD-10-CM | POA: Diagnosis not present

## 2022-05-28 DIAGNOSIS — M25561 Pain in right knee: Secondary | ICD-10-CM | POA: Diagnosis not present

## 2022-06-05 DIAGNOSIS — Z1231 Encounter for screening mammogram for malignant neoplasm of breast: Secondary | ICD-10-CM | POA: Diagnosis not present

## 2022-06-10 ENCOUNTER — Telehealth: Payer: Self-pay | Admitting: Cardiovascular Disease

## 2022-06-10 DIAGNOSIS — I4821 Permanent atrial fibrillation: Secondary | ICD-10-CM

## 2022-06-10 MED ORDER — APIXABAN 5 MG PO TABS: 5.0000 mg | ORAL_TABLET | Freq: Two times a day (BID) | ORAL | 5 refills | Status: DC

## 2022-06-10 NOTE — Telephone Encounter (Signed)
Prescription refill request for Eliquis received. Indication: Afib  Last office visit: 05/18/22 (Nahser)  Scr: 0.84 (04/01/22)  Age: 77 Weight: 91.9kg  Appropriate dose. Refill sent.

## 2022-06-10 NOTE — Telephone Encounter (Signed)
*  STAT* If patient is at the pharmacy, call can be transferred to refill team.   1. Which medications need to be refilled? (please list name of each medication and dose if known)   apixaban (ELIQUIS) 5 MG TABS tablet   2. Which pharmacy/location (including street and city if local pharmacy) is medication to be sent to?  [facility] - [place] - [address]   3. Do they need a 30 day or 90 day supply?   30 day  Patient stated she has 5 days left of this medication.

## 2022-06-11 DIAGNOSIS — M85852 Other specified disorders of bone density and structure, left thigh: Secondary | ICD-10-CM | POA: Diagnosis not present

## 2022-06-11 DIAGNOSIS — E2839 Other primary ovarian failure: Secondary | ICD-10-CM | POA: Diagnosis not present

## 2022-06-15 ENCOUNTER — Other Ambulatory Visit: Payer: Self-pay

## 2022-06-15 MED ORDER — FUROSEMIDE 20 MG PO TABS: 20.0000 mg | ORAL_TABLET | Freq: Every day | ORAL | 3 refills | Status: DC

## 2022-06-15 NOTE — Addendum Note (Signed)
Addended by: Margaret Pyle D on: 06/15/2022 04:16 PM   Modules accepted: Orders

## 2022-06-18 DIAGNOSIS — M25562 Pain in left knee: Secondary | ICD-10-CM | POA: Diagnosis not present

## 2022-06-18 DIAGNOSIS — M25561 Pain in right knee: Secondary | ICD-10-CM | POA: Diagnosis not present

## 2022-06-19 ENCOUNTER — Telehealth: Payer: Self-pay

## 2022-06-19 NOTE — Telephone Encounter (Signed)
The patient called and stated that her preferred pharmacy is [facility] and that she isn't using [facility] anymore.  The patient said that her Furosemide refill came from [facility] today and I told the patient that her refill was sent to [facility] on 06/15/2022 for a year supply and that she needed to call [facility] to let them know she is no longer using them because they maybe filling a script that had refills left.

## 2022-07-15 DIAGNOSIS — D3132 Benign neoplasm of left choroid: Secondary | ICD-10-CM | POA: Diagnosis not present

## 2022-07-15 DIAGNOSIS — H524 Presbyopia: Secondary | ICD-10-CM | POA: Diagnosis not present

## 2022-07-15 DIAGNOSIS — H04123 Dry eye syndrome of bilateral lacrimal glands: Secondary | ICD-10-CM | POA: Diagnosis not present

## 2022-07-15 DIAGNOSIS — H43393 Other vitreous opacities, bilateral: Secondary | ICD-10-CM | POA: Diagnosis not present

## 2022-07-30 DIAGNOSIS — D6859 Other primary thrombophilia: Secondary | ICD-10-CM | POA: Diagnosis not present

## 2022-07-30 DIAGNOSIS — N3281 Overactive bladder: Secondary | ICD-10-CM | POA: Diagnosis not present

## 2022-07-30 DIAGNOSIS — R7303 Prediabetes: Secondary | ICD-10-CM | POA: Diagnosis not present

## 2022-07-30 DIAGNOSIS — I10 Essential (primary) hypertension: Secondary | ICD-10-CM | POA: Diagnosis not present

## 2022-07-30 DIAGNOSIS — I4819 Other persistent atrial fibrillation: Secondary | ICD-10-CM | POA: Diagnosis not present

## 2022-07-30 DIAGNOSIS — Z79899 Other long term (current) drug therapy: Secondary | ICD-10-CM | POA: Diagnosis not present

## 2022-07-30 DIAGNOSIS — M8580 Other specified disorders of bone density and structure, unspecified site: Secondary | ICD-10-CM | POA: Diagnosis not present

## 2022-07-30 DIAGNOSIS — E039 Hypothyroidism, unspecified: Secondary | ICD-10-CM | POA: Diagnosis not present

## 2022-07-30 DIAGNOSIS — E785 Hyperlipidemia, unspecified: Secondary | ICD-10-CM | POA: Diagnosis not present

## 2022-08-02 DIAGNOSIS — R2241 Localized swelling, mass and lump, right lower limb: Secondary | ICD-10-CM | POA: Diagnosis not present

## 2022-08-02 DIAGNOSIS — L03115 Cellulitis of right lower limb: Secondary | ICD-10-CM | POA: Diagnosis not present

## 2022-08-07 ENCOUNTER — Telehealth: Payer: Self-pay | Admitting: Cardiovascular Disease

## 2022-08-07 NOTE — Telephone Encounter (Signed)
Patient states Sunday she went to urgent care for cellulitis. It has cleared up and she is on 2 antibiotics but she wanted to let Dr. Elease Hashimoto know and to see if he has any recommendations. Please advise.

## 2022-08-07 NOTE — Telephone Encounter (Signed)
Returned call to patient who states she didn't need anything in particular, she just "wanted in her records." SO that we would know. Advised her to follow up with her PCP Prochnau if after completing the antibiotics, it's not resolved. No further questions.

## 2022-08-24 DIAGNOSIS — E669 Obesity, unspecified: Secondary | ICD-10-CM | POA: Diagnosis not present

## 2022-08-24 DIAGNOSIS — D6869 Other thrombophilia: Secondary | ICD-10-CM | POA: Diagnosis not present

## 2022-08-24 DIAGNOSIS — N3281 Overactive bladder: Secondary | ICD-10-CM | POA: Diagnosis not present

## 2022-08-24 DIAGNOSIS — E039 Hypothyroidism, unspecified: Secondary | ICD-10-CM | POA: Diagnosis not present

## 2022-08-24 DIAGNOSIS — E119 Type 2 diabetes mellitus without complications: Secondary | ICD-10-CM | POA: Diagnosis not present

## 2022-08-24 DIAGNOSIS — I10 Essential (primary) hypertension: Secondary | ICD-10-CM | POA: Diagnosis not present

## 2022-08-24 DIAGNOSIS — K219 Gastro-esophageal reflux disease without esophagitis: Secondary | ICD-10-CM | POA: Diagnosis not present

## 2022-08-24 DIAGNOSIS — E785 Hyperlipidemia, unspecified: Secondary | ICD-10-CM | POA: Diagnosis not present

## 2022-08-24 DIAGNOSIS — I4891 Unspecified atrial fibrillation: Secondary | ICD-10-CM | POA: Diagnosis not present

## 2022-09-15 ENCOUNTER — Telehealth: Payer: Self-pay | Admitting: Cardiovascular Disease

## 2022-09-15 DIAGNOSIS — I4821 Permanent atrial fibrillation: Secondary | ICD-10-CM

## 2022-09-15 NOTE — Telephone Encounter (Signed)
*  STAT* If patient is at the pharmacy, call can be transferred to refill team. Pt c/o medication issue:  1. Name of Medication:   apixaban (ELIQUIS) 5 MG TABS tablet  empagliflozin ([name]) 10 MG TABS tablet   2. How are you currently taking this medication (dosage and times per day)?   As prescribed  3. Are you having a reaction (difficulty breathing--STAT)?   No  4. What is your medication issue?    Patient stated she is in the donut hole and will need assistance getting these medications.  Patient stated she is running out of these medications.  Patient wants advice on next steps.

## 2022-09-15 NOTE — Telephone Encounter (Signed)
Patient is returning call. Requesting return call.  

## 2022-09-15 NOTE — Telephone Encounter (Signed)
 Left message for patient to call back  

## 2022-09-16 ENCOUNTER — Other Ambulatory Visit: Payer: Self-pay

## 2022-09-16 DIAGNOSIS — I4821 Permanent atrial fibrillation: Secondary | ICD-10-CM

## 2022-09-16 MED ORDER — EMPAGLIFLOZIN 10 MG PO TABS: 10.0000 mg | ORAL_TABLET | Freq: Every day | ORAL | 0 refills | Status: DC

## 2022-09-16 MED ORDER — APIXABAN 5 MG PO TABS
5.0000 mg | ORAL_TABLET | Freq: Two times a day (BID) | ORAL | 0 refills | Status: DC
Start: 2022-09-16 — End: 2023-04-12

## 2022-09-16 MED ORDER — APIXABAN 5 MG PO TABS
5.0000 mg | ORAL_TABLET | Freq: Two times a day (BID) | ORAL | 1 refills | Status: DC
Start: 2022-09-16 — End: 2022-09-16

## 2022-09-16 NOTE — Telephone Encounter (Signed)
Prescription refill request for Eliquis received. Indication: Afib  Last office visit: 05/18/22 (Nahser)  Scr: 0.84 (04/01/22)  Age: 77 Weight: 91.9kg  Appropriate dose. Refill sent.

## 2022-09-16 NOTE — Telephone Encounter (Signed)
**Note De-Identified Rickiya Picariello Obfuscation** No answer so I left a message on the pts VM asking her to call Larita Fife at Dr Harvie Bridge office at [phone].

## 2022-09-16 NOTE — Telephone Encounter (Signed)
Per Larita Fife, LPN, pt assistance coordinator, leaving pt 2 weeks of samples of [name] 10 mg and Eliquis 5 mg tablets and both patient assistance application for jardiance (BI Cares) and Eliquis (BMS) at [facility] at the front desk for pt to pick up. FYI

## 2022-09-21 NOTE — Telephone Encounter (Signed)
**Note De-Identified Davien Malone Obfuscation** No answer so I left a message on the pts VM asking her to call Larita Fife back at Dr Harvie Bridge office at [phone].

## 2022-10-13 ENCOUNTER — Telehealth: Payer: Self-pay

## 2022-10-13 NOTE — Telephone Encounter (Signed)
**Note De-Identified Claus Silvestro Obfuscation** I advised the pt that we have left her 2 weeks of [name] 10 mg and Eliquis 5 mg samples in the front office along with a BMSPAF application for Eliquis assistance and [employer] Cares application for [name] assistance for her to pick up.  The pt states that she is getting help from a patient advocate at her PCPs office for her Eliquis but is in the donut hole and can no longer afford [name].  We discussed her applying for [name] assistance through [facility] and she is aware that if approved they will provide [name] free of charge to her for the remainder of this year.  She is aware to complete her part of the [facility] Cares application, obtain required documents per [facility], and to drop all off at the office and that we will take care of the providers page of her application and we will fax all to [facility].  She is aware to hold onto her BMSPAF application for Eliquis assistance just in case she needs it later.  She verbalized understanding to all information given and thanked me for our assistance.

## 2022-10-13 NOTE — Telephone Encounter (Signed)
Pt's patient assistance application was scanned to Lynn, LPN email. FYI 

## 2022-10-13 NOTE — Telephone Encounter (Signed)
Patient returned staff call. 

## 2022-10-13 NOTE — Telephone Encounter (Signed)
Patient dropped off  forms for [facility]   In folder

## 2022-10-14 NOTE — Telephone Encounter (Signed)
**Note De-Identified Kayla Hale Obfuscation** I have completed the providers page of the pts [employer] application for [name] assistance and have e-mailed all to Dr Harvie Bridge nurse so she can obtain his signature (while he is in the office on Friday 8/16), date it, and to fax to [name] at the fax number written on the cover letter included.

## 2022-10-16 NOTE — Telephone Encounter (Signed)
Printed, signed, and faxed with confirmation received.

## 2023-01-30 ENCOUNTER — Other Ambulatory Visit: Payer: Self-pay | Admitting: Physician Assistant

## 2023-02-02 ENCOUNTER — Telehealth: Payer: Self-pay | Admitting: Cardiovascular Disease

## 2023-02-02 MED ORDER — SPIRONOLACTONE 25 MG PO TABS: 12.5000 mg | ORAL_TABLET | Freq: Every day | ORAL | 1 refills | Status: DC

## 2023-02-02 MED ORDER — METOPROLOL TARTRATE 50 MG PO TABS: 75.0000 mg | ORAL_TABLET | Freq: Two times a day (BID) | ORAL | 1 refills | Status: DC

## 2023-02-02 NOTE — Telephone Encounter (Signed)
*  STAT* If patient is at the pharmacy, call can be transferred to refill team.   1. Which medications need to be refilled? (please list name of each medication and dose if known)   metoprolol tartrate (LOPRESSOR) 50 MG tablet  spironolactone (ALDACTONE) 25 MG tablet   2. Would you like to learn more about the convenience, safety, & potential cost savings by using the [facility]?   3. Are you open to using the [facility] (Type [facility]. ).  4. Which pharmacy/location (including street and city if local pharmacy) is medication to be sent to?  [facility] - [place] - [address]   5. Do they need a 30 day or 90 day supply?  Two week supply   Caller Riki Rusk) wants a small supply of this medication sent to patient's local pharmacy as patient is completely out of this medication and it will take them a little while to get patient this medication.

## 2023-02-02 NOTE — Telephone Encounter (Signed)
 RX sent to requested Pharmacy

## 2023-04-12 ENCOUNTER — Telehealth: Payer: Self-pay | Admitting: Cardiovascular Disease

## 2023-04-12 DIAGNOSIS — I4821 Permanent atrial fibrillation: Secondary | ICD-10-CM

## 2023-04-12 MED ORDER — APIXABAN 5 MG PO TABS
5.0000 mg | ORAL_TABLET | Freq: Two times a day (BID) | ORAL | 1 refills | Status: DC
Start: 2023-04-12 — End: 2023-08-09

## 2023-04-12 NOTE — Telephone Encounter (Signed)
*  STAT* If patient is at the pharmacy, call can be transferred to refill team.   1. Which medications need to be refilled? (please list name of each medication and dose if known) apixaban  (ELIQUIS ) 5 MG TABS tablet   2. Which pharmacy/location (including street and city if local pharmacy) is medication to be sent to? [facility] - [place] - [address]   3. Do they need a 30 day or 90 day supply? 90

## 2023-04-12 NOTE — Telephone Encounter (Signed)
 Prescription refill request for Eliquis  received. Indication: AF Last office visit: 05/18/22  P Nahser MD Scr: 0.84 on 04/01/22  Epic Age: 78 Weight: 91.9kg  Based on above findings Eliquis  5mg  twice daily is the appropriate dose.  Refill approved.  Has appt with Dr Floria Hurst in March.  Requested labs be drawn at that time.

## 2023-04-23 ENCOUNTER — Telehealth: Payer: Self-pay | Admitting: Cardiovascular Disease

## 2023-04-23 NOTE — Telephone Encounter (Signed)
Paper Work Dropped Off: [employer]  Date:04/23/23  Location of paper:  Dr. Melburn Popper Box  Need to be faxed no later then 04/29/23

## 2023-04-27 NOTE — Telephone Encounter (Signed)
 [employer] chronic condition form received, filled out, signed by Dr Elease Hashimoto, faxed and confirmation received. Called patient and she would like to pick originals up on 05/18/23 when she comes in for her OV. Placed up front for her.

## 2023-05-03 ENCOUNTER — Telehealth: Payer: Self-pay | Admitting: Cardiovascular Disease

## 2023-05-03 MED ORDER — FUROSEMIDE 20 MG PO TABS: 20.0000 mg | ORAL_TABLET | Freq: Every day | ORAL | 0 refills | Status: DC

## 2023-05-03 NOTE — Telephone Encounter (Signed)
*  STAT* If patient is at the pharmacy, call can be transferred to refill team.   1. Which medications need to be refilled? (please list name of each medication and dose if known) nee s new prescription for Furosemide- changing pharmacy   2. Would you like to learn more about the convenience, safety, & potential cost savings by using the [facility]?     3. Are you open to using the [facility] ([facility].    4. Which pharmacy/location (including street and city if local pharmacy) is medication to be sent to?[facility]  [address]  [place]    5. Do they need a 30 day or 90 day supply? 90 days and refills

## 2023-05-17 ENCOUNTER — Encounter: Payer: Self-pay | Admitting: Cardiovascular Disease

## 2023-05-17 NOTE — Progress Notes (Unsigned)
 Cardiology Office Note   Date:  05/18/2023   ID:  Kayla Hale, Kayla Hale 1945-10-09, MRN [id number]  PCP:  Rosita Fire, NP  Cardiologist:   Kristeen Miss, MD   Chief Complaint  Patient presents with   Atrial Fibrillation        Congestive Heart Failure        1. Atrial fibrillation - CHADS VASC score = 3 2. Hypertension  Kayla Hale is a 78 yo who is referred for atrial fib.  Hx of HTN.   She is basically a symptomatically she was found to have an irregular heart rate on  A visit to Urgent care ( which was for a bladder infection) . EKG revealed atrial fibrillation. She does have occasional episodes of palpitations but he seemed to be more related to stress.  Nonsmoker Rare ETOH -   Fhx - father died of MI. ( age 56s), sister has CAd,,   April. 26, 2016: Kayla Hale is a 78 y.o. female who presents for  Atrial fib.  She's done very well. She's been keeping a record of her blood pressure readings and for the most part they are  In the normal  Range. She has not been cardioverted.   She has had atrial fib for many years.    12/24/2014:  She is doing well Is in the donut hole ( mostly from Eliquis )  No CP or dyspnea.  Oct. 31, 2017:  Kayla Hale is seen Today for follow-up of her hypertension and atrial fibrillation. Is not working anymore at [employer] ( previously worked at [employer] )  Exercising - walks every day for 30 minutes.     June 24, 2016:  Kayla Hale is seen for follow up .  Is working - hosting open houses on the weekends. . No CP or dyspnea.   Tries to walk every day for 30 minutes   March 09, 2017:  Doing well.  INR was 2.1 today .   Wants to change to a DOAC ,   She Will call her insurance co. Has gained some weight .  Walking 3 times a week .   Is watching her carbs ( recently )   Jan. 8, 2020  Kayla Hale is seen today .  Has persistent Afib.    Limited by knee pain .   Tries to walk or ride her bike 3 days a week .  No CP or dyspnea.   Jan. 12,  2021;  Kayla Hale is seen today for follow up of her atrial fib  Feeling well.   Tries to work out 3 times a week  HR remains elevated.   June 23, 2019: Kayla Hale is seen today for follow-up of her atrial fibrillation.  She has been having some leg swelling and presents today for further evaluation. Had some ham for easter.  Also ate hot dogs.  Developed the foot swelling 2 days after that.  Swelling has gone down .  Avoids salt for the most part .  Does not want to start a diuretic because she is on Ditropan   June, 3, 2022: Kayla Hale is seen today for HTN, leg edema Diet has improved.  Still has some leg edema   May 18, 2022 Kayla Hale is seen today for HTN, leg edema , chronic diastolic CHF , atrial fib  No CP ,  Breathing is ok Tries to ride her bike 3 days a week  Wt is 202 lbs   Legs look better  May 18, 2023 Kayla Hale is seen for follow up of her HTN, leg edema  Seems to be doing well Has some shortness of breath on occasion Tries to get some exercise   On low dose atorvastatin .  Her medical doctor manages her lipids      Past Medical History:  Diagnosis Date   Aortic valve regurgitation 08/31/2021   Echocardiogram 08/2021: EF 60-65, no RWMA, normal RVSF, mildly elevated PASP (RVSP 40.9), severe BAE, trivial MR, mild to moderate AI   Atrial fibrillation (HCC)    Chronic kidney disease    STAGE 2   Diverticulosis 2004   Dyslipidemia    Gallstones    GERD (gastroesophageal reflux disease)    Heart failure with preserved EF 01/09/2020   Echocardiogram 11/21: EF 50-5, no RWMA, mild reduced RVSF, RVSP 33.8, mod BAE, trivial MR, mild AI   Hyperlipidemia    Hypertension    Hypothyroidism    Mild anemia    Mild obesity    Nuclear stress test    Myoview 11/21: min apical thinning, no scar or ischemia, EF 42; intermediate risk due to calculated EF (EF looks better than calculated).// Myoview 03/04/22: EF 50, normal perfusion; low risk   Seasonal allergies    Shingles 2005    LOWER BACK   Thyroid disease    Urinary incontinence     Past Surgical History:  Procedure Laterality Date   CHOLECYSTECTOMY N/A 03/16/2014   Procedure: LAPAROSCOPIC CHOLECYSTECTOMY WITH INTRAOPERATIVE CHOLANGIOGRAM;  Surgeon: Emelia Loron, MD;  Location: [facility] OR;  Service: General;  Laterality: N/A;   COLONOSCOPY     TUBAL LIGATION       Current Outpatient Medications  Medication Sig Dispense Refill   ALPRAZolam (XANAX) 0.5 MG tablet Take 0.5 mg by mouth daily.     apixaban (ELIQUIS) 5 MG TABS tablet Take 1 tablet (5 mg total) by mouth 2 (two) times daily. 180 tablet 1   Ascorbic Acid (VITAMIN C) 1000 MG tablet Take 1,000 mg by mouth daily.     atorvastatin (LIPITOR) 10 MG tablet Take 10 mg by mouth daily.     Calcium Carbonate-Vitamin D (CALCIUM-VITAMIN D) 500-200 MG-UNIT per tablet Take 1 tablet by mouth daily.     cholecalciferol (VITAMIN D) 1000 UNITS tablet Take 1,000 Units by mouth daily.     colestipol (COLESTID) 1 g tablet Take 1 g by mouth 2 (two) times daily.     furosemide (LASIX) 20 MG tablet Take 1 tablet (20 mg total) by mouth daily. 90 tablet 0   levothyroxine (SYNTHROID, LEVOTHROID) 75 MCG tablet Take 75 mcg by mouth daily before breakfast.     MAGNESIUM-ZINC PO Take 1 tablet by mouth daily.     metoprolol tartrate (LOPRESSOR) 50 MG tablet Take 1.5 tablets (75 mg total) by mouth 2 (two) times daily. 270 tablet 1   Misc Natural Products (AIRBORNE ELDERBERRY PO) Take 1 tablet by mouth daily.     Multiple Vitamin (MULTIVITAMIN) tablet Take 1 tablet by mouth daily.     MYRBETRIQ 25 MG TB24 tablet Take 25 mg by mouth daily.     Omega-3 Fatty Acids (FISH OIL) 1000 MG CAPS Take 1 capsule by mouth daily.     omeprazole (PRILOSEC) 20 MG capsule Take 20 mg by mouth daily.     UNABLE TO FIND cbd oil qhs     spironolactone (ALDACTONE) 25 MG tablet TAKE 1/2 TABLET EVERY DAY (Patient not taking: Reported on 05/18/2023) 45 tablet 3   No  current facility-administered medications  for this visit.    Allergies:   Lisinopril and Norvasc [amlodipine besylate]    Social History:  The patient  reports that she has never smoked. She has never used smokeless tobacco. She reports that she does not drink alcohol and does not use drugs.   Family History:  The patient's family history includes CVA in her brother; Diabetes in her brother and sister; Heart attack in her father and sister; Hypertension in her father.    ROS: As per current history.  Otherwise negative.   Physical Exam: Blood pressure 128/80, pulse (!) 104, resp. rate 16, height 5\' 6"  (1.676 m), weight 203 lb 6.4 oz (92.3 kg), SpO2 95%.     GEN:  Well nourished, well developed in no acute distress HEENT: Normal NECK: No JVD; No carotid bruits LYMPHATICS: No lymphadenopathy CARDIAC:   Irreg. Irreg.  RESPIRATORY:  Clear to auscultation without rales, wheezing or rhonchi  ABDOMEN: Soft, non-tender, non-distended MUSCULOSKELETAL:  1-2 pitting edema in her legs  SKIN: Warm and dry NEUROLOGIC:  Alert and oriented x 3    EKG:     EKG Interpretation Date/Time:  Tuesday May 18 2023 11:20:50 EDT Ventricular Rate:  98 PR Interval:    QRS Duration:  74 QT Interval:  346 QTC Calculation: 441 R Axis:   55  Text Interpretation: Atrial fibrillation Low voltage QRS When compared with ECG of 13-Mar-2014 13:16, No significant change was found Confirmed by Kristeen Miss ([id number]) on 05/18/2023 11:42:09 AM      Recent Labs: No results found for requested labs within last 365 days.    Lipid Panel No results found for: "CHOL", "TRIG", "HDL", "CHOLHDL", "VLDL", "LDLCALC", "LDLDIRECT"    Wt Readings from Last 3 Encounters:  05/18/23 203 lb 6.4 oz (92.3 kg)  05/18/22 202 lb 9.6 oz (91.9 kg)  03/17/22 206 lb 12.8 oz (93.8 kg)      Other studies Reviewed: Additional studies/ records that were reviewed today include: . Review of the above records demonstrates:    ASSESSMENT AND PLAN:  1. Atrial  fibrillation - CHADS VASC score = 3  Rate is well controlled Continue eliquis  Check BMP and CBC today since she is on eliquis   Continue metoprolol   2. Hypertension - .    Well controlled.   3. Hyperlipidemia:   managed by her primary md    The following changes have been made:  no change  Labs/ tests ordered today include:   Orders Placed This Encounter  Procedures   CBC   Basic metabolic panel   EKG 12-Lead     Disposition:       Signed, Kristeen Miss, MD  05/18/2023 3:52 PM    [facility] [address] [place], [place], [place]  [address] Phone: [phone]; Fax: [phone]

## 2023-05-18 ENCOUNTER — Ambulatory Visit: Payer: Medicare HMO | Attending: Cardiovascular Disease | Admitting: Cardiovascular Disease

## 2023-05-18 ENCOUNTER — Encounter: Payer: Self-pay | Admitting: Cardiovascular Disease

## 2023-05-18 VITALS — BP 128/80 | HR 104 | Resp 16 | Ht 66.0 in | Wt 203.4 lb

## 2023-05-18 DIAGNOSIS — I5032 Chronic diastolic (congestive) heart failure: Secondary | ICD-10-CM | POA: Diagnosis not present

## 2023-05-18 DIAGNOSIS — I4821 Permanent atrial fibrillation: Secondary | ICD-10-CM

## 2023-05-18 DIAGNOSIS — Z79899 Other long term (current) drug therapy: Secondary | ICD-10-CM | POA: Diagnosis not present

## 2023-05-18 DIAGNOSIS — I10 Essential (primary) hypertension: Secondary | ICD-10-CM

## 2023-05-18 NOTE — Patient Instructions (Addendum)
 Medication Instructions:  Your physician recommends that you continue on your current medications as directed. Please refer to the Current Medication list given to you today.  *If you need a refill on your cardiac medications before your next appointment, please call your pharmacy*  Lab Work: TODAY: CBC, BMET If you have labs (blood work) drawn today and your tests are completely normal, you will receive your results only by: MyChart Message (if you have MyChart) OR A paper copy in the mail If you have any lab test that is abnormal or we need to change your treatment, we will call you to review the results.  Follow-Up: At [facility], you and your health needs are our priority.  As part of our continuing mission to provide you with exceptional heart care, we have created designated Provider Care Teams.  These Care Teams include your primary Cardiologist (physician) and Advanced Practice Providers (APPs -  Physician Assistants and Nurse Practitioners) who all work together to provide you with the care you need, when you need it.  Your next appointment:   1 year  The format for your next appointment:   In Person  Provider:   Kristeen Miss, MD  or Jari Favre, PA-C, Ronie Spies, PA-C, Robin Searing, NP, Jacolyn Reedy, PA-C, Tereso Newcomer, PA-C, or Perlie Gold, PA-C   Other Instructions    1st Floor: - Lobby - Registration  - Pharmacy  - Lab - Cafe  2nd Floor: - PV Lab - Diagnostic Testing (echo, CT, nuclear med)  3rd Floor: - Vacant  4th Floor: - TCTS (cardiothoracic surgery) - AFib Clinic - Structural Heart Clinic - Vascular Surgery  - Vascular Ultrasound  5th Floor: - HeartCare Cardiology (general and EP) - Clinical Pharmacy for coumadin, hypertension, lipid, weight-loss medications, and med management appointments    Valet parking services will be available as well.

## 2023-05-19 LAB — BASIC METABOLIC PANEL
BUN/Creatinine Ratio: 19 (ref 12–28)
BUN: 19 mg/dL (ref 8–27)
CO2: 23 mmol/L (ref 20–29)
Calcium: 9.2 mg/dL (ref 8.7–10.3)
Chloride: 104 mmol/L (ref 96–106)
Creatinine, Ser: 0.98 mg/dL (ref 0.57–1.00)
Glucose: 94 mg/dL (ref 70–99)
Potassium: 5.2 mmol/L (ref 3.5–5.2)
Sodium: 139 mmol/L (ref 134–144)
eGFR: 59 mL/min/{1.73_m2} — ABNORMAL LOW (ref >59)

## 2023-05-19 LAB — CBC
Hematocrit: 37.2 % (ref 34.0–46.6)
Hemoglobin: 12.2 g/dL (ref 11.1–15.9)
MCH: 31.1 pg (ref 26.6–33.0)
MCHC: 32.8 g/dL (ref 31.5–35.7)
MCV: 95 fL (ref 79–97)
Platelets: 233 10*3/uL (ref 150–450)
RBC: 3.92 x10E6/uL (ref 3.77–5.28)
RDW: 12.2 % (ref 11.7–15.4)
WBC: 7.8 10*3/uL (ref 3.4–10.8)

## 2023-05-20 ENCOUNTER — Encounter: Payer: Self-pay | Admitting: Cardiovascular Disease

## 2023-06-25 NOTE — Telephone Encounter (Signed)
 Paperwork has been discarded since patient hasn't picked it from our office due to us  moving to [employer].

## 2023-07-22 ENCOUNTER — Other Ambulatory Visit: Payer: Self-pay | Admitting: Cardiovascular Disease

## 2023-07-25 ENCOUNTER — Other Ambulatory Visit: Payer: Self-pay | Admitting: Cardiovascular Disease

## 2023-08-07 ENCOUNTER — Other Ambulatory Visit: Payer: Self-pay | Admitting: Cardiovascular Disease

## 2023-08-07 DIAGNOSIS — I4821 Permanent atrial fibrillation: Secondary | ICD-10-CM

## 2023-08-09 NOTE — Telephone Encounter (Signed)
 Prescription refill request for Eliquis  received. Indication:afib Last office visit:3/25 Scr:0.98  3/25 Age: 78 Weight:92.3  kg  Prescription refilled

## 2023-10-26 ENCOUNTER — Other Ambulatory Visit: Payer: Self-pay

## 2023-10-26 MED ORDER — METOPROLOL TARTRATE 50 MG PO TABS: 75.0000 mg | ORAL_TABLET | Freq: Two times a day (BID) | ORAL | 1 refills | Status: AC

## 2024-03-16 ENCOUNTER — Other Ambulatory Visit: Payer: Self-pay | Admitting: Physician Assistant

## 2024-03-16 DIAGNOSIS — I4821 Permanent atrial fibrillation: Secondary | ICD-10-CM

## 2024-03-16 MED ORDER — APIXABAN 5 MG PO TABS: 5.0000 mg | ORAL_TABLET | Freq: Two times a day (BID) | ORAL | 1 refills | Status: AC
